# Patient Record
Sex: Male | Born: 1950
Health system: Southern US, Community
[De-identification: ages and names within clinical notes are randomized; demographics above are authoritative.]

## PROBLEM LIST (undated history)

## (undated) DIAGNOSIS — Z86010 Personal history of colonic polyps: Secondary | ICD-10-CM

## (undated) DIAGNOSIS — E0590 Thyrotoxicosis, unspecified without thyrotoxic crisis or storm: Secondary | ICD-10-CM

## (undated) DIAGNOSIS — E079 Disorder of thyroid, unspecified: Secondary | ICD-10-CM

## (undated) DIAGNOSIS — M1990 Unspecified osteoarthritis, unspecified site: Secondary | ICD-10-CM

## (undated) DIAGNOSIS — E039 Hypothyroidism, unspecified: Secondary | ICD-10-CM

## (undated) HISTORY — PX: TOE SURGERY: SHX1073

## (undated) HISTORY — PX: AMPUTATION OF REPLICATED TOES: SHX1136

## (undated) HISTORY — PX: OTHER SURGICAL HISTORY: SHX169

## (undated) HISTORY — DX: Personal history of colonic polyps: Z86.010

## (undated) HISTORY — PX: COLONOSCOPY: SHX174

---

## 1975-07-08 HISTORY — PX: APPENDECTOMY: SHX54

## 2003-07-03 ENCOUNTER — Ambulatory Visit (HOSPITAL_COMMUNITY): Admission: RE | Admit: 2003-07-03 | Discharge: 2003-07-03 | Payer: Self-pay | Admitting: Internal Medicine

## 2006-04-24 ENCOUNTER — Ambulatory Visit (HOSPITAL_COMMUNITY): Admission: RE | Admit: 2006-04-24 | Discharge: 2006-04-24 | Payer: Self-pay | Admitting: Family Medicine

## 2006-06-08 ENCOUNTER — Ambulatory Visit (HOSPITAL_COMMUNITY): Admission: RE | Admit: 2006-06-08 | Discharge: 2006-06-08 | Payer: Self-pay | Admitting: General Surgery

## 2008-04-26 ENCOUNTER — Ambulatory Visit (HOSPITAL_COMMUNITY): Admission: RE | Admit: 2008-04-26 | Discharge: 2008-04-26 | Payer: Self-pay | Admitting: Family Medicine

## 2008-05-05 ENCOUNTER — Ambulatory Visit (HOSPITAL_COMMUNITY): Admission: RE | Admit: 2008-05-05 | Discharge: 2008-05-05 | Payer: Self-pay | Admitting: Family Medicine

## 2009-03-07 IMAGING — CT CT PELVIS WO/W CM
2 of 4 series · 14 of 32 positions shown, 19 images · IV contrast (Omnipaque 300)
Comparison: None

CT ABDOMEN

CLINICAL DATA: Microscopic hematuria, abdominal bruit

CT ABDOMEN AND PELVIS WITHOUT AND WITH CONTRAST
TECHNIQUE: Multidetector CT imaging of the abdomen and pelvis was
performed following the standard protocol before and following the
bolus administration of intravenous contrast. Sagittal and coronal
MPR images reconstructed from axial data set.
Contrast: 125 ml Qmnipaque-FII

[Series 3: abd_pel_wo 5.0 b40f · axial · 0.83mm/px · z∈[-480,-190]mm · 6 of 94 slices shown]
[im 12/94  soft-tissue]
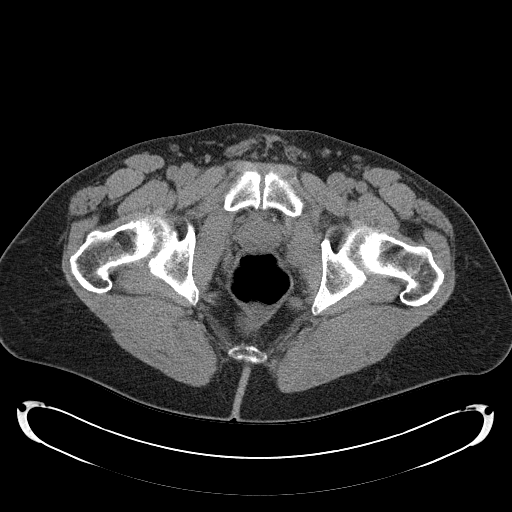
[im 24/94  soft-tissue]
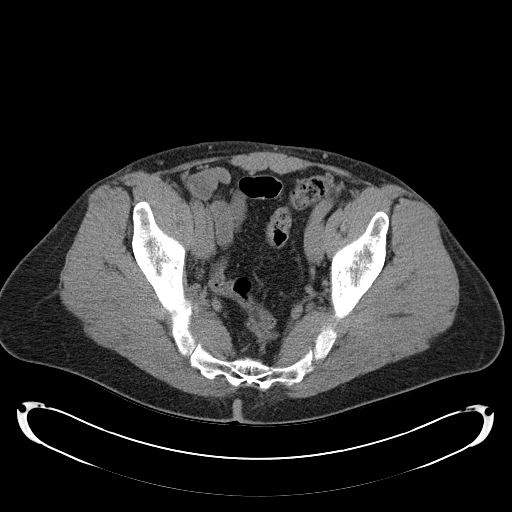
[im 35/94  soft-tissue]
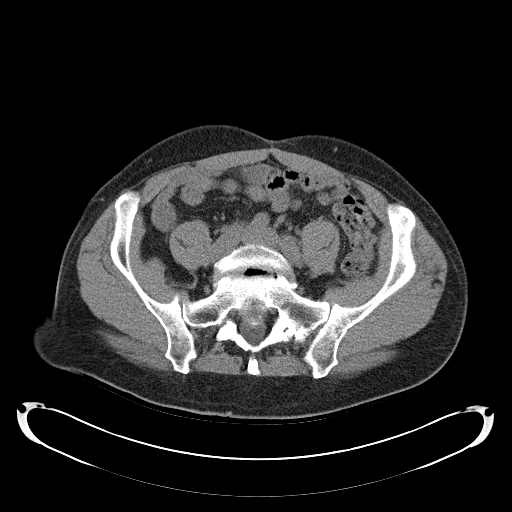
[im 47/94  soft-tissue]
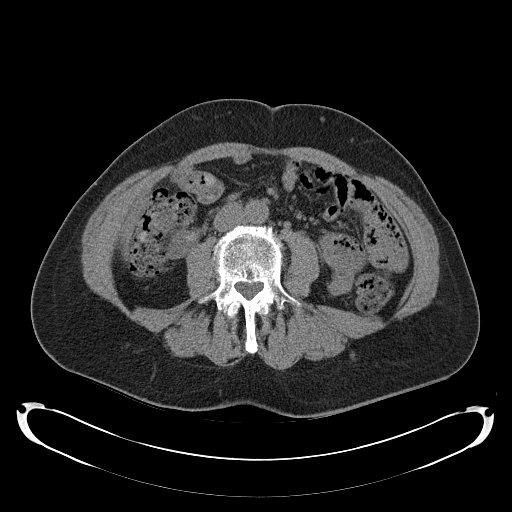
[im 59/94  soft-tissue]
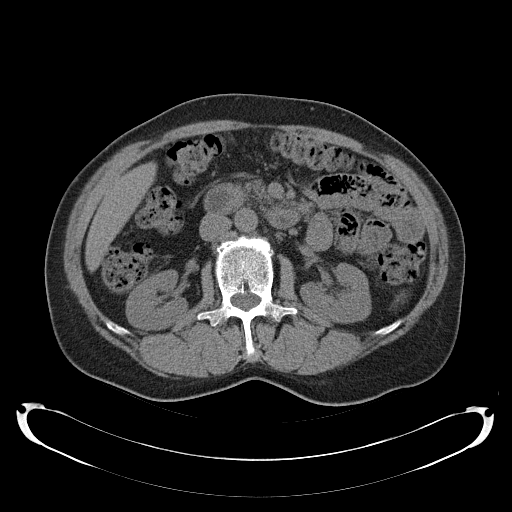
[im 70/94  soft-tissue]
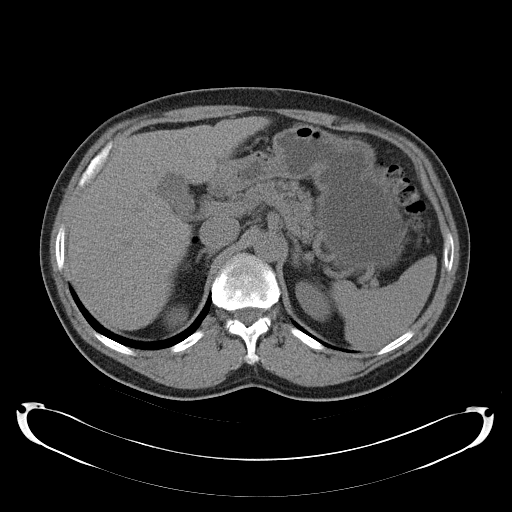

[Series 6: abd_pel_with 5.0 b40f · axial · 0.83mm/px · z∈[-484,-124]mm · 8 of 94 slices shown, 13 images]
[im 11/94  soft-tissue]
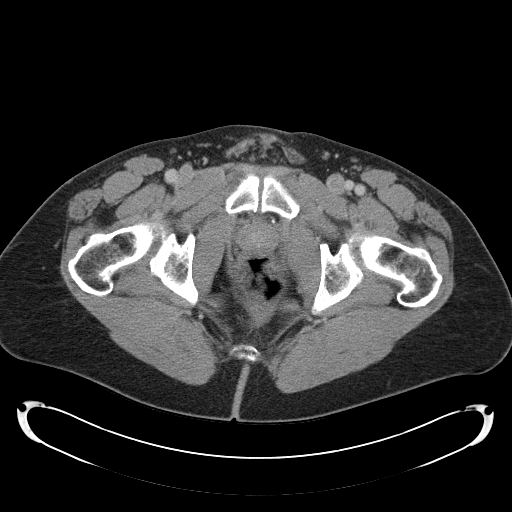
[im 11/94  bone]
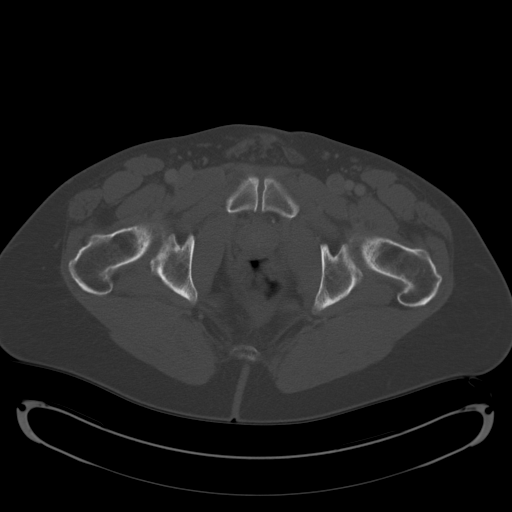
[im 21/94  soft-tissue]
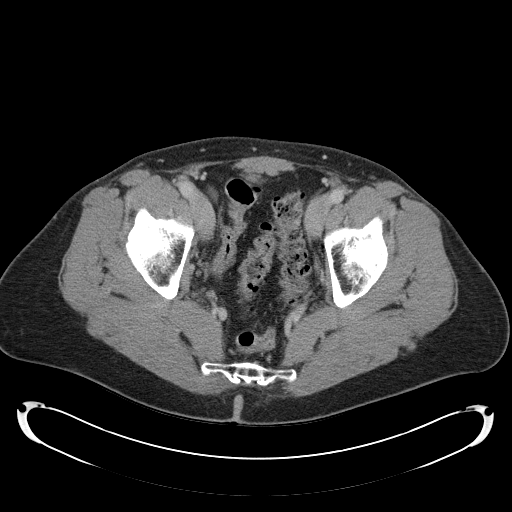
[im 32/94  soft-tissue]
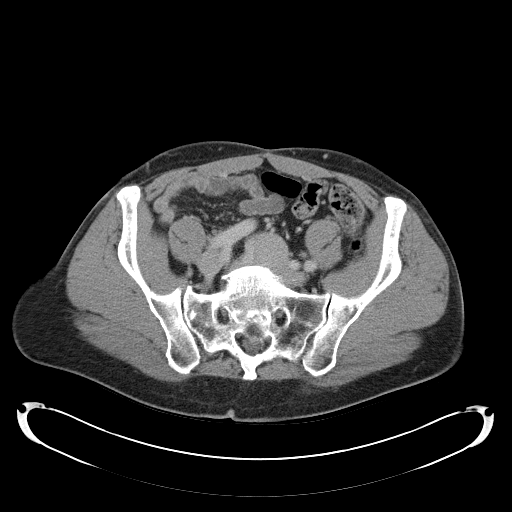
[im 42/94  soft-tissue]
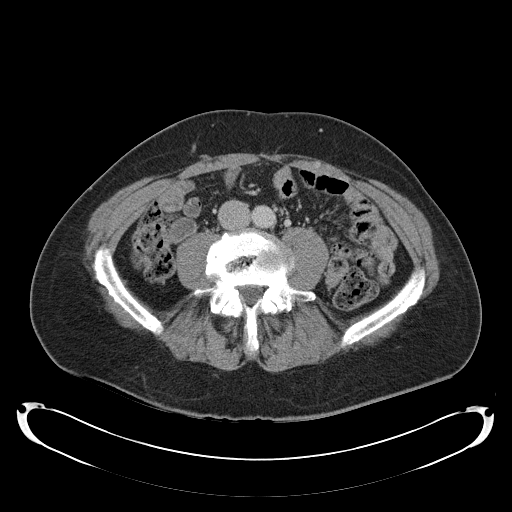
[im 52/94  soft-tissue]
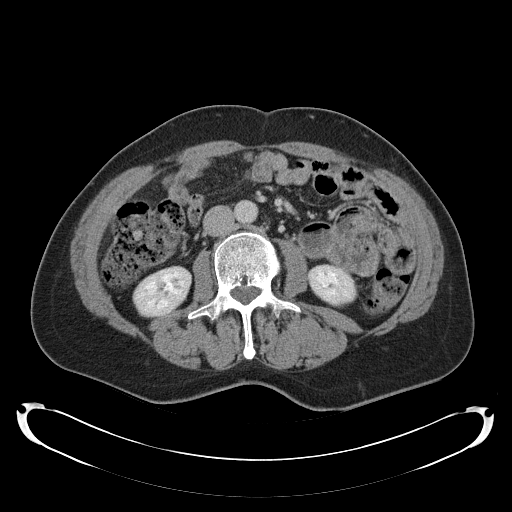
[im 52/94  lung]
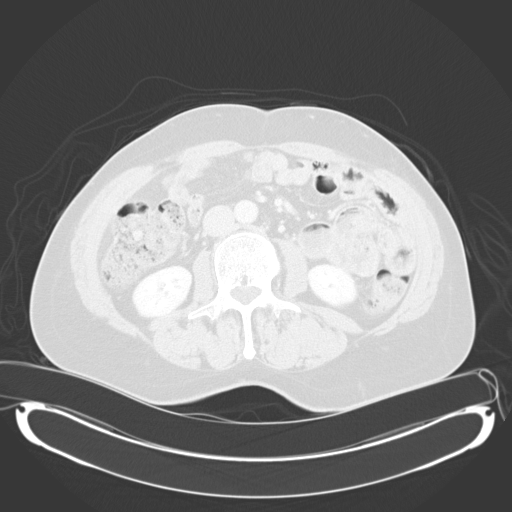
[im 63/94  soft-tissue]
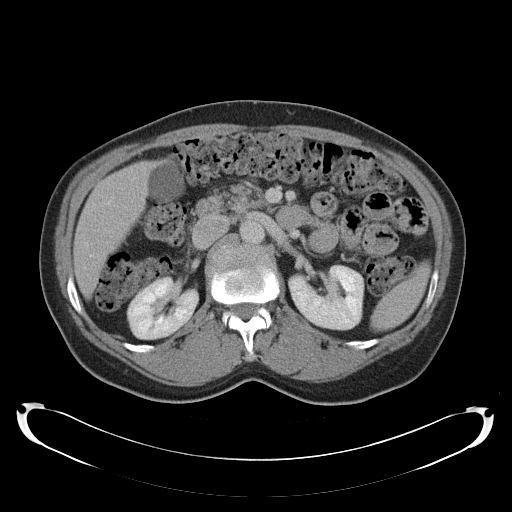
[im 63/94  lung]
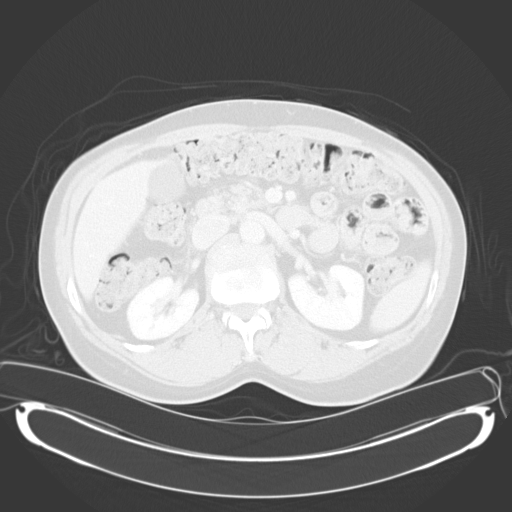
[im 73/94  soft-tissue]
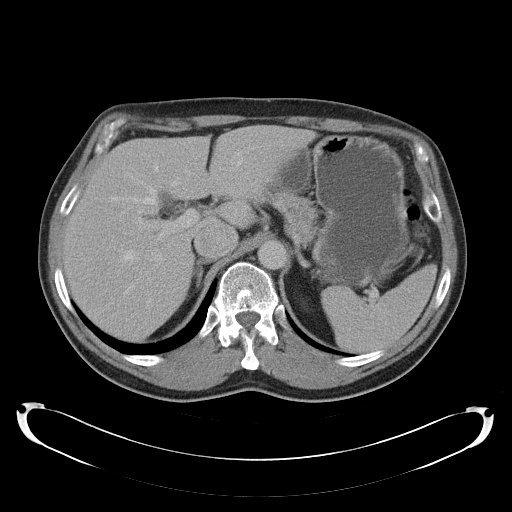
[im 73/94  lung]
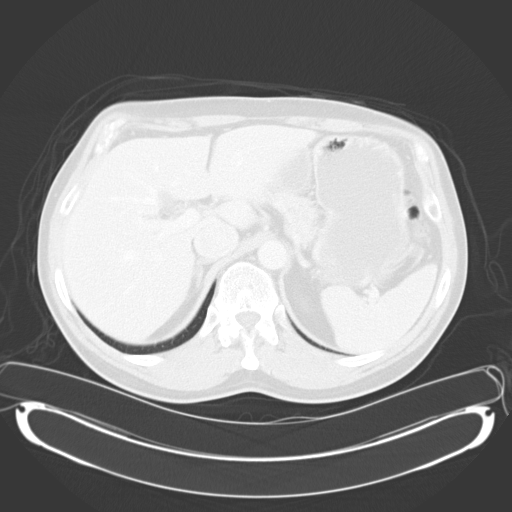
[im 83/94  soft-tissue]
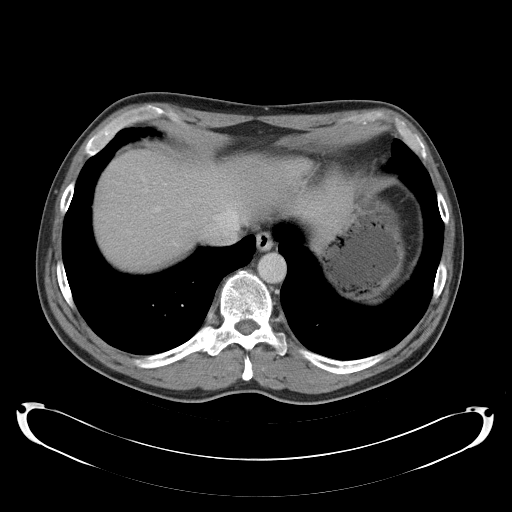
[im 83/94  lung]
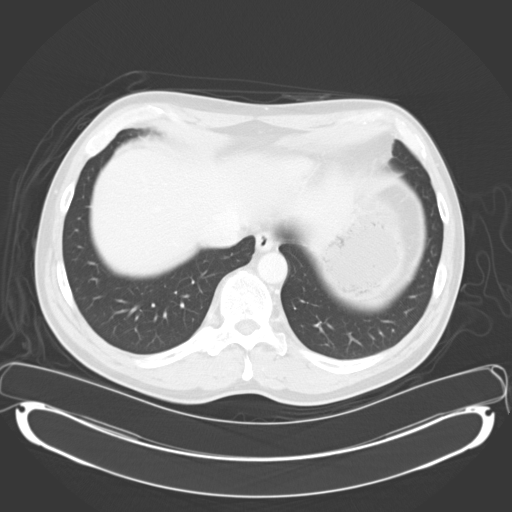

[14 of 32 positions shown; findings below may reference images not displayed]

FINDINGS: Lung bases clear.
No renal calcifications seen in either kidney on precontrast exam.
Dependent calculi in gallbladder.
Tiny cyst mid left kidney, 5 mm image 30. Normal renal enhancement
without mass, hydronephrosis, or nephrographic defect.
No collecting system abnormalities.
Single nonobstructed ureters.

Liver, spleen, pancreas, and adrenal glands normal.
Stomach and upper abdominal bowel loops unremarkable.
No mass, adenopathy, or free fluid.
No evidence of abdominal aortic aneurysm or atherosclerotic
calcification.
IMPRESSION: Tiny 5 mm diameter left renal cyst.
Cholelithiasis.
Otherwise normal CT abdomen.

CT PELVIS
FINDINGS: Distal ureters normal caliber without filling defects or
dilatation.
Small bladder volume demonstrated, no gross bladder abnormalities
seen.
Small left pelvic phleboliths.
Large and small bowel loops in pelvis normal.
No pelvic mass, adenopathy, or free fluid.
Degenerative disc and facet disease changes lumbar spine.
IMPRESSION: No acute intrapelvic abnormalities.

## 2010-11-22 NOTE — H&P (Signed)
NAMEJEREMIA, GROOT NO.:  [id number]   MEDICAL RECORD NO.:  [id number]          PATIENT TYPE:  AMB   LOCATION:                                FACILITY:  [facility]   PHYSICIAN:  Dalia Heading, M.D.  DATE OF BIRTH:  03-05-1951   DATE OF ADMISSION:  DATE OF DISCHARGE:  LH                                HISTORY & PHYSICAL   CHIEF COMPLAINT:  Need for screening colonoscopy.   HISTORY OF PRESENT ILLNESS:  The patient is a 60 year old white male who is  referred for endoscopic evaluation.  He needs a colonoscopy for screening  purposes.  No abdominal pain, weight loss, nausea, vomiting, diarrhea,  constipation, melena, or hematochezia.  He has never had a colonoscopy.  There is no family history of colon carcinoma.   PAST MEDICAL HISTORY:  Unremarkable.   PAST SURGICAL HISTORY:  Foot surgery.   CURRENT MEDICATIONS:  None.   ALLERGIES:  No known drug allergies.   REVIEW OF SYSTEMS:  Noncontributory.   PHYSICAL EXAMINATION:  GENERAL:  The patient is a well-developed, well-  nourished white male in no acute distress.  LUNGS:  Clear to auscultation with equal breath sounds bilaterally.  HEART EXAMINATION:  Reveals A regular rate and rhythm without S3-S4, or  murmurs.  ABDOMEN:  Soft, nontender, nondistended.  No hepatosplenomegaly or masses  noted.  RECTAL:  Examination was deferred to the procedure.   IMPRESSION:  Need for screening colonoscopy.   PLAN:  The patient is scheduled for a colonoscopy on 06/08/2006.  The risks  and benefits of the procedure including bleeding and perforation were fully  explained to the patient who gave informed consent.      Dalia Heading, M.D.  Electronically Signed     MAJ/MEDQ  D:  05/07/2006  T:  05/07/2006  Job:  [id number]   cc:   Patrica Duel, M.D.  Fax: [phone]

## 2014-04-24 ENCOUNTER — Other Ambulatory Visit (HOSPITAL_COMMUNITY): Payer: Self-pay | Admitting: Internal Medicine

## 2014-04-24 DIAGNOSIS — R945 Abnormal results of liver function studies: Secondary | ICD-10-CM

## 2014-04-24 DIAGNOSIS — E0590 Thyrotoxicosis, unspecified without thyrotoxic crisis or storm: Secondary | ICD-10-CM

## 2014-04-25 ENCOUNTER — Encounter (HOSPITAL_COMMUNITY): Admission: RE | Admit: 2014-04-25 | Payer: Managed Care, Other (non HMO) | Source: Ambulatory Visit

## 2014-04-26 ENCOUNTER — Other Ambulatory Visit (HOSPITAL_COMMUNITY): Payer: Self-pay

## 2014-04-26 ENCOUNTER — Encounter (HOSPITAL_COMMUNITY): Payer: Managed Care, Other (non HMO)

## 2014-05-02 ENCOUNTER — Encounter (HOSPITAL_COMMUNITY)
Admission: RE | Admit: 2014-05-02 | Discharge: 2014-05-02 | Disposition: A | Discharge status: Home / Self Care | Payer: Managed Care, Other (non HMO) | Source: Ambulatory Visit | Attending: Internal Medicine | Admitting: Internal Medicine

## 2014-05-02 ENCOUNTER — Encounter (HOSPITAL_COMMUNITY): Payer: Self-pay

## 2014-05-02 DIAGNOSIS — E0590 Thyrotoxicosis, unspecified without thyrotoxic crisis or storm: Secondary | ICD-10-CM | POA: Insufficient documentation

## 2014-05-02 MED ORDER — SODIUM IODIDE I 131 CAPSULE
14.0000 | Freq: Once | INTRAVENOUS | Status: AC | PRN
  Administered 2014-05-02: 14 via ORAL

## 2014-05-03 ENCOUNTER — Encounter (HOSPITAL_COMMUNITY)
Admission: RE | Admit: 2014-05-03 | Discharge: 2014-05-03 | Disposition: A | Discharge status: Home / Self Care | Payer: Managed Care, Other (non HMO) | Source: Ambulatory Visit | Attending: Internal Medicine | Admitting: Internal Medicine

## 2014-05-03 DIAGNOSIS — E0590 Thyrotoxicosis, unspecified without thyrotoxic crisis or storm: Secondary | ICD-10-CM | POA: Diagnosis not present

## 2014-05-03 MED ORDER — SODIUM PERTECHNETATE TC 99M INJECTION
10.0000 | Freq: Once | INTRAVENOUS | Status: AC | PRN
  Administered 2014-05-03: 10 via INTRAVENOUS

## 2014-05-30 ENCOUNTER — Encounter (HOSPITAL_COMMUNITY): Payer: Self-pay | Admitting: Emergency Medicine

## 2014-05-30 ENCOUNTER — Emergency Department (HOSPITAL_COMMUNITY)
Admission: EM | Admit: 2014-05-30 | Discharge: 2014-05-30 | Disposition: A | Discharge status: Home / Self Care | Payer: Managed Care, Other (non HMO) | Source: Home / Self Care | Attending: Family Medicine | Admitting: Family Medicine

## 2014-05-30 DIAGNOSIS — J029 Acute pharyngitis, unspecified: Secondary | ICD-10-CM

## 2014-05-30 MED ORDER — IPRATROPIUM BROMIDE 0.06 % NA SOLN: 2.0000 | Freq: Four times a day (QID) | NASAL | Status: DC

## 2014-05-30 MED ORDER — AZITHROMYCIN 250 MG PO TABS: 250.0000 mg | ORAL_TABLET | Freq: Every day | ORAL | Status: DC

## 2014-05-30 MED ORDER — METHYLPREDNISOLONE ACETATE PF 80 MG/ML IJ SUSP
80.0000 mg | Freq: Once | INTRAMUSCULAR | Status: AC
  Administered 2014-05-30: 80 mg via INTRAMUSCULAR

## 2014-05-30 MED ORDER — METHYLPREDNISOLONE ACETATE 80 MG/ML IJ SUSP
INTRAMUSCULAR | Status: AC
  Filled 2014-05-30: qty 1

## 2014-05-30 NOTE — ED Provider Notes (Signed)
Jonathan Ochoa is a 63 y.o. male who presents to Urgent Care today for chills fatigue sore throat and weakness. Symptoms present for several days. Patient additionally has facial pain and pressure. He's tried over-the-counter medications. Typically in the past she has done well with a steroid shot. He would like this today if possible.   History reviewed. No pertinent past medical history. History reviewed. No pertinent past surgical history. History  Substance Use Topics  . Smoking status: Never Smoker   . Smokeless tobacco: Not on file  . Alcohol Use: No   ROS as above Medications: No current facility-administered medications for this encounter.   Current Outpatient Prescriptions  Medication Sig Dispense Refill  . azithromycin (ZITHROMAX) 250 MG tablet Take 1 tablet (250 mg total) by mouth daily. Take first 2 tablets together, then 1 every day until finished. 6 tablet 0  . ipratropium (ATROVENT) 0.06 % nasal spray Place 2 sprays into both nostrils 4 (four) times daily. 15 mL 1   No Known Allergies   Exam:  BP 136/65 mmHg  Pulse 81  Temp(Src) 99.3 F (37.4 C) (Oral)  Resp 16  SpO2 100% Gen: Well NAD HEENT: EOMI,  MMM posterior pharynx with cobblestoning. Normal tympanic membranes. Clear nasal discharge. Mildly tender bilateral maxillary sinuses present. Lungs: Normal work of breathing. CTABL Heart: RRR no MRG Abd: NABS, Soft. Nondistended, Nontender Exts: Brisk capillary refill, warm and well perfused.   80 mg IM Depo-Medrol injection provided.  No results found for this or any previous visit (from the past 24 hour(s)). No results found.  Assessment and Plan: 63 y.o. male with pharyngitis and sinusitis likely viral. Treatment with steroid shot as above Atrovent nasal spray. He is azithromycin if not improving.  Discussed warning signs or symptoms. Please see discharge instructions. Patient expresses understanding.     Gregor Hams, MD 05/30/14 [phone]

## 2014-05-30 NOTE — Discharge Instructions (Signed)
Thank you for coming in today. Continue Tylenol and ibuprofen as needed. Use Atrovent nasal spray. Take azithromycin antibiotics if not getting better Call or go to the emergency room if you get worse, have trouble breathing, have chest pains, or palpitations.    Pharyngitis Pharyngitis is redness, pain, and swelling (inflammation) of your pharynx.  CAUSES  Pharyngitis is usually caused by infection. Most of the time, these infections are from viruses (viral) and are part of a cold. However, sometimes pharyngitis is caused by bacteria (bacterial). Pharyngitis can also be caused by allergies. Viral pharyngitis may be spread from person to person by coughing, sneezing, and personal items or utensils (cups, forks, spoons, toothbrushes). Bacterial pharyngitis may be spread from person to person by more intimate contact, such as kissing.  SIGNS AND SYMPTOMS  Symptoms of pharyngitis include:   Sore throat.   Tiredness (fatigue).   Low-grade fever.   Headache.  Joint pain and muscle aches.  Skin rashes.  Swollen lymph nodes.  Plaque-like film on throat or tonsils (often seen with bacterial pharyngitis). DIAGNOSIS  Your health care provider will ask you questions about your illness and your symptoms. Your medical history, along with a physical exam, is often all that is needed to diagnose pharyngitis. Sometimes, a rapid strep test is done. Other lab tests may also be done, depending on the suspected cause.  TREATMENT  Viral pharyngitis will usually get better in 3-4 days without the use of medicine. Bacterial pharyngitis is treated with medicines that kill germs (antibiotics).  HOME CARE INSTRUCTIONS   Drink enough water and fluids to keep your urine clear or pale yellow.   Only take over-the-counter or prescription medicines as directed by your health care provider:   If you are prescribed antibiotics, make sure you finish them even if you start to feel better.   Do not take  aspirin.   Get lots of rest.   Gargle with 8 oz of salt water ( tsp of salt per 1 qt of water) as often as every 1-2 hours to soothe your throat.   Throat lozenges (if you are not at risk for choking) or sprays may be used to soothe your throat. SEEK MEDICAL CARE IF:   You have large, tender lumps in your neck.  You have a rash.  You cough up green, yellow-brown, or bloody spit. SEEK IMMEDIATE MEDICAL CARE IF:   Your neck becomes stiff.  You drool or are unable to swallow liquids.  You vomit or are unable to keep medicines or liquids down.  You have severe pain that does not go away with the use of recommended medicines.  You have trouble breathing (not caused by a stuffy nose). MAKE SURE YOU:   Understand these instructions.  Will watch your condition.  Will get help right away if you are not doing well or get worse. Document Released: 06/23/2005 Document Revised: 04/13/2013 Document Reviewed: 02/28/2013 [facility] Patient Information 2015 [place], [place]. This information is not intended to replace advice given to you by your health care provider. Make sure you discuss any questions you have with your health care provider.

## 2014-05-30 NOTE — ED Notes (Signed)
Fever, headache, sorethroat, and weakness onset sunday

## 2014-07-27 ENCOUNTER — Other Ambulatory Visit (HOSPITAL_COMMUNITY): Payer: Self-pay | Admitting: Endocrinology

## 2014-07-27 DIAGNOSIS — E0590 Thyrotoxicosis, unspecified without thyrotoxic crisis or storm: Secondary | ICD-10-CM

## 2014-08-04 ENCOUNTER — Encounter (HOSPITAL_COMMUNITY)
Admission: RE | Admit: 2014-08-04 | Discharge: 2014-08-04 | Disposition: A | Discharge status: Home / Self Care | Payer: Managed Care, Other (non HMO) | Source: Ambulatory Visit | Attending: Endocrinology | Admitting: Endocrinology

## 2014-08-04 ENCOUNTER — Encounter (HOSPITAL_COMMUNITY): Payer: Self-pay

## 2014-08-04 DIAGNOSIS — E0590 Thyrotoxicosis, unspecified without thyrotoxic crisis or storm: Secondary | ICD-10-CM | POA: Insufficient documentation

## 2014-08-04 MED ORDER — SODIUM IODIDE I 131 CAPSULE
20.0000 | Freq: Once | INTRAVENOUS | Status: AC | PRN
  Administered 2014-08-04: 21 via ORAL

## 2015-06-06 IMAGING — NM NM RAI THERAPY FOR HYPERTHYROIDISM
1 series · 1 of 1 positions shown · non-contrast
Comparison: none

CLINICAL DATA: Hyperthyroidism

EXAM:
RADIOACTIVE IODINE THERAPY FOR HYPERTHYROIDISM
TECHNIQUE: Procedure, benefits and risks of radioactive iodine therapy for
hyperthyroidism were discussed with the patient, as well as
alternatives.

[Series 1: bone statics · 2.07mm/px · 1 of 1 slices shown]
[im 1/1]
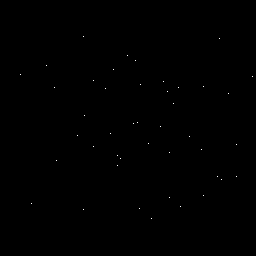

[1 of 1 positions shown; findings below may reference images not displayed]

Radiation safety issues and procedures were discussed, including
minimization of radiation risk to family and general public.

Written safety instructions were provided to the patient.

Patient's questions were answered.

Written informed consent for the therapy was obtained.

Timeout protocol followed.

Patient then received the radiopharmaceutical without immediate
complication.

RADIOPHARMACEUTICALS:  21 mCi G-YBY sodium iodide orally
IMPRESSION: Per oral administration of 21 mCi of G-YBY sodium iodide for the
treatment of hyperthyroidism.

## 2015-10-05 DIAGNOSIS — M25562 Pain in left knee: Secondary | ICD-10-CM | POA: Diagnosis not present

## 2016-01-15 DIAGNOSIS — M25562 Pain in left knee: Secondary | ICD-10-CM | POA: Diagnosis not present

## 2016-02-04 DIAGNOSIS — S83282A Other tear of lateral meniscus, current injury, left knee, initial encounter: Secondary | ICD-10-CM | POA: Diagnosis not present

## 2016-02-04 DIAGNOSIS — S83242A Other tear of medial meniscus, current injury, left knee, initial encounter: Secondary | ICD-10-CM | POA: Diagnosis not present

## 2016-02-04 DIAGNOSIS — M23262 Derangement of other lateral meniscus due to old tear or injury, left knee: Secondary | ICD-10-CM | POA: Diagnosis not present

## 2016-02-04 DIAGNOSIS — M94262 Chondromalacia, left knee: Secondary | ICD-10-CM | POA: Diagnosis not present

## 2016-02-04 DIAGNOSIS — S83232A Complex tear of medial meniscus, current injury, left knee, initial encounter: Secondary | ICD-10-CM | POA: Diagnosis not present

## 2016-03-05 DIAGNOSIS — J069 Acute upper respiratory infection, unspecified: Secondary | ICD-10-CM | POA: Diagnosis not present

## 2016-03-05 DIAGNOSIS — E063 Autoimmune thyroiditis: Secondary | ICD-10-CM | POA: Diagnosis not present

## 2016-03-05 DIAGNOSIS — J018 Other acute sinusitis: Secondary | ICD-10-CM | POA: Diagnosis not present

## 2016-03-05 DIAGNOSIS — Z23 Encounter for immunization: Secondary | ICD-10-CM | POA: Diagnosis not present

## 2016-03-05 DIAGNOSIS — Z1389 Encounter for screening for other disorder: Secondary | ICD-10-CM | POA: Diagnosis not present

## 2016-06-11 DIAGNOSIS — E890 Postprocedural hypothyroidism: Secondary | ICD-10-CM | POA: Diagnosis not present

## 2016-06-17 DIAGNOSIS — Z1389 Encounter for screening for other disorder: Secondary | ICD-10-CM | POA: Diagnosis not present

## 2016-06-17 DIAGNOSIS — Z0000 Encounter for general adult medical examination without abnormal findings: Secondary | ICD-10-CM | POA: Diagnosis not present

## 2016-06-17 DIAGNOSIS — E063 Autoimmune thyroiditis: Secondary | ICD-10-CM | POA: Diagnosis not present

## 2016-06-17 DIAGNOSIS — Z23 Encounter for immunization: Secondary | ICD-10-CM | POA: Diagnosis not present

## 2016-06-17 DIAGNOSIS — I10 Essential (primary) hypertension: Secondary | ICD-10-CM | POA: Diagnosis not present

## 2016-06-17 DIAGNOSIS — E890 Postprocedural hypothyroidism: Secondary | ICD-10-CM | POA: Diagnosis not present

## 2016-07-02 DIAGNOSIS — Z1211 Encounter for screening for malignant neoplasm of colon: Secondary | ICD-10-CM | POA: Diagnosis not present

## 2016-07-03 DIAGNOSIS — L570 Actinic keratosis: Secondary | ICD-10-CM | POA: Diagnosis not present

## 2016-07-03 DIAGNOSIS — X32XXXD Exposure to sunlight, subsequent encounter: Secondary | ICD-10-CM | POA: Diagnosis not present

## 2016-07-03 DIAGNOSIS — D225 Melanocytic nevi of trunk: Secondary | ICD-10-CM | POA: Diagnosis not present

## 2016-12-12 DIAGNOSIS — M25562 Pain in left knee: Secondary | ICD-10-CM | POA: Diagnosis not present

## 2017-03-15 DIAGNOSIS — M79661 Pain in right lower leg: Secondary | ICD-10-CM | POA: Diagnosis not present

## 2017-05-01 DIAGNOSIS — M545 Low back pain: Secondary | ICD-10-CM | POA: Diagnosis not present

## 2017-05-14 DIAGNOSIS — M4316 Spondylolisthesis, lumbar region: Secondary | ICD-10-CM | POA: Diagnosis not present

## 2017-05-14 DIAGNOSIS — M545 Low back pain: Secondary | ICD-10-CM | POA: Diagnosis not present

## 2017-05-14 DIAGNOSIS — M5416 Radiculopathy, lumbar region: Secondary | ICD-10-CM | POA: Diagnosis not present

## 2017-06-04 DIAGNOSIS — M1712 Unilateral primary osteoarthritis, left knee: Secondary | ICD-10-CM | POA: Diagnosis not present

## 2017-06-04 DIAGNOSIS — M1711 Unilateral primary osteoarthritis, right knee: Secondary | ICD-10-CM | POA: Diagnosis not present

## 2017-06-04 DIAGNOSIS — M170 Bilateral primary osteoarthritis of knee: Secondary | ICD-10-CM | POA: Diagnosis not present

## 2017-06-06 ENCOUNTER — Encounter (HOSPITAL_COMMUNITY): Payer: Self-pay | Admitting: Family Medicine

## 2017-06-06 ENCOUNTER — Ambulatory Visit (HOSPITAL_COMMUNITY)
Admission: EM | Admit: 2017-06-06 | Discharge: 2017-06-06 | Disposition: A | Discharge status: Home / Self Care | Payer: Managed Care, Other (non HMO) | Attending: Internal Medicine | Admitting: Internal Medicine

## 2017-06-06 DIAGNOSIS — J069 Acute upper respiratory infection, unspecified: Secondary | ICD-10-CM

## 2017-06-06 HISTORY — DX: Disorder of thyroid, unspecified: E07.9

## 2017-06-06 MED ORDER — FLUTICASONE PROPIONATE 50 MCG/ACT NA SUSP: 1.0000 | Freq: Every day | NASAL | 2 refills | Status: DC

## 2017-06-06 MED ORDER — LEVOFLOXACIN 750 MG PO TABS: 750.0000 mg | ORAL_TABLET | Freq: Every day | ORAL | 0 refills | Status: AC

## 2017-06-06 NOTE — Discharge Instructions (Signed)
Continue to push fluids and take over the counter medications as directed on the back of the box for symptomatic relief.  ° °

## 2017-06-06 NOTE — ED Provider Notes (Signed)
[facility]    CSN: [id number] Arrival date & time: 06/06/17  1629     History   Chief Complaint Chief Complaint  Patient presents with  . URI    HPI Jonathan Ochoa is a 66 y.o. male.   66 y.o. male presents with headache, congestion, sinus pressure and subjective fevers X Thursday. Condition is persistent in nature Condition is acute in nature. Condition is made better by nothing. Condition is made worse by nothing. Patient denies any relief from OTC sinus medication. prior to there arrival at this facility. Patient deneis any cough, nausea, vomitting or diarrhea. Patient states that he gets sinus infections around the same time every year. Patient requesting speciefic antibiotic.        Past Medical History:  Diagnosis Date  . Thyroid disease     There are no active problems to display for this patient.   History reviewed. No pertinent surgical history.     Home Medications    Prior to Admission medications   Medication Sig Start Date End Date Taking? Authorizing Provider  ipratropium (ATROVENT) 0.06 % nasal spray Place 2 sprays into both nostrils 4 (four) times daily. 05/30/14   Gregor Hams, MD    Family History History reviewed. No pertinent family history.  Social History Social History   Tobacco Use  . Smoking status: Never Smoker  Substance Use Topics  . Alcohol use: No  . Drug use: No     Allergies   Patient has no known allergies.   Review of Systems Review of Systems  Constitutional: Positive for fever. Negative for chills.  HENT: Positive for congestion, sinus pressure, sinus pain and sore throat. Negative for ear pain.   Eyes: Negative for pain and visual disturbance.  Respiratory: Negative for cough and shortness of breath.   Cardiovascular: Negative for chest pain and palpitations.  Gastrointestinal: Negative for abdominal pain and vomiting.  Genitourinary: Negative for dysuria and hematuria.  Musculoskeletal:  Negative for arthralgias and back pain.  Skin: Negative for color change and rash.  Neurological: Negative for seizures and syncope.  All other systems reviewed and are negative.    Physical Exam Triage Vital Signs ED Triage Vitals [06/06/17 1717]  Enc Vitals Group     BP (!) 144/71     Pulse Rate 63     Resp 18     Temp 98.4 F (36.9 C)     Temp Source Oral     SpO2 98 %     Weight      Height      Head Circumference      Peak Flow      Pain Score      Pain Loc      Pain Edu?      Excl. in [place]?    No data found.  Updated Vital Signs BP (!) 144/71   Pulse 63   Temp 98.4 F (36.9 C) (Oral)   Resp 18   SpO2 98%   Visual Acuity Right Eye Distance:   Left Eye Distance:   Bilateral Distance:    Right Eye Near:   Left Eye Near:    Bilateral Near:     Physical Exam  Constitutional: He is oriented to person, place, and time. He appears well-developed and well-nourished.  HENT:  Head: Normocephalic.  Neck: Normal range of motion.  Pulmonary/Chest: Effort normal.  Musculoskeletal: Normal range of motion.  Neurological: He is alert and oriented to person, place, and  time.  Skin: Skin is dry.  Psychiatric: He has a normal mood and affect.  Nursing note and vitals reviewed.    UC Treatments / Results  Labs (all labs ordered are listed, but only abnormal results are displayed) Labs Reviewed - No data to display  EKG  EKG Interpretation None       Radiology No results found.  Procedures Procedures (including critical care time)  Medications Ordered in UC Medications - No data to display   Initial Impression / Assessment and Plan / UC Course  I have reviewed the triage vital signs and the nursing notes.  Pertinent labs & imaging results that were available during my care of the patient were reviewed by me and considered in my medical decision making (see chart for details).       Final Clinical Impressions(s) / UC Diagnoses   Final  diagnoses:  None    ED Discharge Orders    None       Controlled Substance Prescriptions [place] Controlled Substance Registry consulted? Not Applicable   Jacqualine Mau, NP 06/06/17 1734

## 2017-06-06 NOTE — ED Triage Notes (Signed)
Pt here for URI symptoms since Thursday. Mainly congestion with mucous and drainage.Marland Kitchen

## 2017-06-12 DIAGNOSIS — M545 Low back pain: Secondary | ICD-10-CM | POA: Diagnosis not present

## 2017-06-12 DIAGNOSIS — M5416 Radiculopathy, lumbar region: Secondary | ICD-10-CM | POA: Diagnosis not present

## 2017-06-12 DIAGNOSIS — M4316 Spondylolisthesis, lumbar region: Secondary | ICD-10-CM | POA: Diagnosis not present

## 2017-06-18 DIAGNOSIS — I10 Essential (primary) hypertension: Secondary | ICD-10-CM | POA: Diagnosis not present

## 2017-06-18 DIAGNOSIS — Z6831 Body mass index (BMI) 31.0-31.9, adult: Secondary | ICD-10-CM | POA: Diagnosis not present

## 2017-06-18 DIAGNOSIS — E039 Hypothyroidism, unspecified: Secondary | ICD-10-CM | POA: Diagnosis not present

## 2017-06-18 DIAGNOSIS — R5383 Other fatigue: Secondary | ICD-10-CM | POA: Diagnosis not present

## 2017-06-18 DIAGNOSIS — J329 Chronic sinusitis, unspecified: Secondary | ICD-10-CM | POA: Diagnosis not present

## 2017-06-18 DIAGNOSIS — E6609 Other obesity due to excess calories: Secondary | ICD-10-CM | POA: Diagnosis not present

## 2017-06-18 DIAGNOSIS — R311 Benign essential microscopic hematuria: Secondary | ICD-10-CM | POA: Diagnosis not present

## 2017-06-18 DIAGNOSIS — Z0001 Encounter for general adult medical examination with abnormal findings: Secondary | ICD-10-CM | POA: Diagnosis not present

## 2017-06-20 DIAGNOSIS — E748 Other specified disorders of carbohydrate metabolism: Secondary | ICD-10-CM | POA: Diagnosis not present

## 2017-09-01 DIAGNOSIS — E0590 Thyrotoxicosis, unspecified without thyrotoxic crisis or storm: Secondary | ICD-10-CM | POA: Diagnosis not present

## 2017-09-03 DIAGNOSIS — M1711 Unilateral primary osteoarthritis, right knee: Secondary | ICD-10-CM | POA: Diagnosis not present

## 2017-09-03 DIAGNOSIS — M1712 Unilateral primary osteoarthritis, left knee: Secondary | ICD-10-CM | POA: Diagnosis not present

## 2017-09-08 DIAGNOSIS — E890 Postprocedural hypothyroidism: Secondary | ICD-10-CM | POA: Diagnosis not present

## 2017-10-08 ENCOUNTER — Ambulatory Visit: Payer: Self-pay | Admitting: Orthopedic Surgery

## 2017-10-23 ENCOUNTER — Ambulatory Visit: Payer: Self-pay | Admitting: Orthopedic Surgery

## 2017-10-23 NOTE — H&P (Signed)
Jonathan Ochoa, Jonathan Ochoa [phone], M) DOB [birth year]   Chief Complaint Left Knee Pain H&P left TKA 11/16/2017 at [facility]  Patient's Pharmacies [facility] [address] [facility]): [address] [place], [place] [address], Ph [phone], Fax [phone]  Vitals Ht: 5 ft 11 in  BP: 130/82 Pulse: 60   Allergies Reviewed Allergies NKDA   Medications Reviewed Medications levothyroxine 137 mcg tablet 09/24/17   filled [employer]   Problems Reviewed Problems Osteoarthritis of knee  Osteoarthritis of left knee joint    Family History Reviewed Family History Father - Parents deceased   - Malignant tumor of lung Mother - Parents deceased   - Cerebrovascular accident   Social History Reviewed Social History Smoking Status: Never smoker Chewing tobacco: none Alcohol intake: None Hand Dominance: Right Work related injury?: N Advance directive: N Medical Power of Attorney: N   Surgical History Reviewed Surgical History Knee Arthroscopy - Left Knee - 2017 Amputation of toe - Left Big Toe, 2nd Toe - 1982 Appendectomy - 1976   Past Medical History Reviewed Past Medical History Thyroid Problems/Goiter: Y - Hypothyroidism   HPI The patient is here today for a pre-operative History and Physical. They are scheduled for left total knee replacement on 11/16/2017 with Dr. Wynelle Link at [facility]. The patient has been followed for bilateral knee pain. Patient presented months out from bilateral knee cortisone injections. He states injections offered only some temporary relief. [name] is a pleasant 67 year old male who has been seen in the clinic for follow-up of bilateral knee osteoarthritis. He has occasional aching and weakness in the knees. He received cortisone injections, which were helpful for only about 6 weeks. He has contractures of both knees. Standard two view radiographs of his bilateral knees demonstrated bone-on-bone arthritis medial and lateral  and patellofemoral. Patient has significant pain and dysfunction in their knee. They have had injections and failed nonoperative management. At this point the pain and dysfunction have essentially taken over their life. The most predictable means of improving pain and function is total knee arthroplasty.  We discussed the procedure risks, potential complications and rehab course in detail and the patient would like to go ahead and proceed with total knee arthroplasty.    ROS Constitutional: Constitutional: no fever, chills, night sweats, or significant weight loss.  Cardiovascular: Cardiovascular: no palpitations or chest pain.  Respiratory: Respiratory: no cough or shortness of breath and No COPD.  Gastrointestinal: Gastrointestinal: no vomiting or nausea.  Musculoskeletal: Musculoskeletal: no swelling in Joints and Joint Pain.  Neurologic: Neurologic: no numbness, tingling, or difficulty with balance.  CONSTITUTIONAL: no Fever, no Chills, no Night Sweats, no Weight Loss  CARDIOVASCULAR: no Cough, no Shortness of Breath, no COPD, no Asthma  GASTROINTESTINAL: no Vomiting, no Nausea  MUSCULOSKELETAL: JOINT PAIN, but no Swelling in Joints  NEUROLOGIC: no Numbness, no Tingling/Paresthesias, no Difficulty with Balance    Physical Exam Patient is a 67 year old male.  General Mental Status - Alert, cooperative and good historian. General Appearance - pleasant, Not in acute distress. Orientation - Oriented X3. Build & Nutrition - Well nourished and Well developed.  Head and Neck Head - normocephalic, atraumatic . Neck Global Assessment - supple, no bruit auscultated on the right, no bruit auscultated on the left.  Eye Wears glasses Pupil - Bilateral - PERR Motion - Bilateral - EOMI.  Chest and Lung Exam Auscultation Breath sounds - clear at anterior chest wall and clear at posterior chest wall. Adventitious sounds - No Adventitious  sounds.  Cardiovascular Auscultation Rhythm - Regular rate and rhythm. Heart Sounds - S1 WNL and S2 WNL. Murmurs & Other Heart Sounds - Auscultation of the heart reveals - No Murmurs.  Abdomen Palpation/Percussion Tenderness - Abdomen is non-tender to palpation. Abdomen is soft. Auscultation Auscultation of the abdomen reveals - Bowel sounds normal.  Male Genitourinary Note: Not done, not pertinent to present illness  Musculoskeletal Left Knee Exam:  No effusion.  Range of motion is 15-120 degrees.  There is no crepitus on range of motion of the knee.  There is no medial joint line tenderness.  There is no instability noted He has a severely antalgic gait pattern  Right Knee Exam:  No effusion.  Range of motion is 5-135 degrees.  There is no crepitus on range of motion of the knee.  There is no medial or lateral joint line tenderness.  There is no instability noted  Radiographs- Standard two views; of bilateral knees demonstrate bone-on-bone arthritis medial and lateral and patellofemoral Impression: End-stage osteoarthritis.   Assessment / Plan 1. Osteoarthritis of knee M17.12: Unilateral primary osteoarthritis, left knee  Goals Patient Instructions Surgical Plans: Left Total Knee Replacement  Disposition: Home, Straight to outpatient - [facility] PCP: Dr. Gerarda Fraction - seen and cleared to proceed with surgery IV TXA Anesthesia Issues: None Patient was instructed on what medications to stop prior to surgery. - Follow up visit in 2 weeks with Dr. Wynelle Link - Begin physical therapy following surgery - Provider to call patient to setup first PT EVAL on Friday 11/20/17 - Dr. Wynelle Link - Left Total Knee Replacement - Pre-operative lab work as pre Pre-Surgical Testing - Prescriptions will be provided in hospital at time of discharge   Anticipated LOS equal to or greater than 2 midnights due to - Age 32 and older with one or more of the following:  - Obesity  -  Expected need for hospital services (PT, OT, Nursing) required for safe  discharge  - Anticipated need for postoperative skilled nursing care or inpatient rehab  - Active co-morbidities: None   Return to Office Jonathan Arabian, MD for [facility] on 12/01/2017 at 01:30 PM  Encounter signed-off by Mickel Crow, PA-C

## 2017-10-23 NOTE — H&P (Signed)
PETRA, SARGEANT [phone], M) DOB    05-07-1951        Chief Complaint Left Knee Pain H&P left TKA 11/16/2017 at [facility]  Patient's Pharmacies [facility] [facility]): [address] [name], [place] [address], Ph [phone], Fax [phone]  Vitals Ht:        5 ft 11 in  BP: 130/82 Pulse: 60   Allergies Reviewed Allergies NKDA   Medications Reviewed Medications levothyroxine 137 mcg tablet 09/24/17   filled           [facility]   Problems Reviewed Problems Osteoarthritis of knee  Osteoarthritis of left knee joint    Family History Reviewed Family History Father  - Parents deceased             - Malignant tumor of lung Mother - Parents deceased             - Cerebrovascular accident   Social History Reviewed Social History Smoking Status: Never smoker Chewing tobacco: none Alcohol intake: None Hand Dominance: Right Work related injury?: N Advance directive: N Medical Power of Attorney: N   Surgical History Reviewed Surgical History Knee Arthroscopy - Left Knee - 2017 Amputation of toe - Left Big Toe, 2nd Toe - 1982 Appendectomy - 1976   Past Medical History Reviewed Past Medical History Thyroid Problems/Goiter: Y - Hypothyroidism   HPI The patient is here today for a pre-operative History and Physical. They are scheduled for left total knee replacement on 11/16/2017 with Dr. Wynelle Link at [facility]. The patient has been followed for bilateral knee pain. Patient presented months out from bilateral knee cortisone injections. He states injections offered only some temporary relief. Mr. Huot is a pleasant 67 year old male who has been seen in the clinic for follow-up of bilateral knee osteoarthritis. He has occasional aching and weakness in the knees. He received cortisone injections, which were helpful for only about 6 weeks. He has contractures of both knees. Standard two view radiographs of his  bilateral knees demonstrated bone-on-bone arthritis medial and lateral and patellofemoral. Patient has significant pain and dysfunction in their knee. They have had injections and failed nonoperative management. At this point the pain and dysfunction have essentially taken over their life. The most predictable means of improving pain and function is total knee arthroplasty.  We discussed the procedure risks, potential complications and rehab course in detail and the patient would like to go ahead and proceed with total knee arthroplasty.    ROS Constitutional: Constitutional: no fever, chills, night sweats, or significant weight loss.  Cardiovascular: Cardiovascular: no palpitations or chest pain.  Respiratory: Respiratory: no cough or shortness of breath and No COPD.  Gastrointestinal: Gastrointestinal: no vomiting or nausea.  Musculoskeletal: Musculoskeletal: no swelling in Joints and Joint Pain.  Neurologic: Neurologic: no numbness, tingling, or difficulty with balance.  CONSTITUTIONAL: no Fever, no Chills, no Night Sweats, no Weight Loss  CARDIOVASCULAR: no Cough, no Shortness of Breath, no COPD, no Asthma  GASTROINTESTINAL: no Vomiting, no Nausea  MUSCULOSKELETAL: JOINT PAIN, but no Swelling in Joints  NEUROLOGIC: no Numbness, no Tingling/Paresthesias, no Difficulty with Balance    Physical Exam Patient is a 67 year old male.  General Mental Status - Alert, cooperative and good historian. General Appearance - pleasant, Not in acute distress. Orientation - Oriented X3. Build & Nutrition - Well nourished and Well developed.  Head and Neck Head - normocephalic, atraumatic . Neck Global Assessment - supple, no bruit auscultated on the right, no bruit  auscultated on the left.  Eye Wears glasses Pupil - Bilateral - PERR Motion - Bilateral - EOMI.  Chest and Lung Exam Auscultation Breath sounds - clear at anterior chest wall and clear at posterior  chest wall. Adventitious sounds - No Adventitious sounds.  Cardiovascular Auscultation Rhythm - Regular rate and rhythm. Heart Sounds - S1 WNL and S2 WNL. Murmurs & Other Heart Sounds - Auscultation of the heart reveals - No Murmurs.  Abdomen Palpation/Percussion Tenderness - Abdomen is non-tender to palpation. Abdomen is soft. Auscultation Auscultation of the abdomen reveals - Bowel sounds normal.  Male Genitourinary Note: Not done, not pertinent to present illness  Musculoskeletal Left Knee Exam:  No effusion.  Range of motion is 15-120 degrees.  There is no crepitus on range of motion of the knee.  There is no medial joint line tenderness.  There is no instability noted He has a severely antalgic gait pattern  Right Knee Exam:  No effusion.  Range of motion is 5-135 degrees.  There is no crepitus on range of motion of the knee.  There is no medial or lateral joint line tenderness.  There is no instability noted  Radiographs- Standard two views; of bilateral knees demonstrate bone-on-bone arthritis medial and lateral and patellofemoral Impression: End-stage osteoarthritis.   Assessment / Plan 1. Osteoarthritis of knee M17.12: Unilateral primary osteoarthritis, left knee  Goals Patient Instructions Surgical Plans: Left Total Knee Replacement  Disposition: Home, Straight to outpatient - [facility] PCP: Dr. Gerarda Fraction - seen and cleared to proceed with surgery IV TXA Anesthesia Issues: None Patient was instructed on what medications to stop prior to surgery. - Follow up visit in 2 weeks with Dr. Wynelle Link - Begin physical therapy following surgery - Provider to call patient to setup first PT EVAL on Friday 11/20/17 - Dr. Wynelle Link - Left Total Knee Replacement - Pre-operative lab work as pre Pre-Surgical Testing - Prescriptions will be provided in hospital at time of discharge   Anticipated LOS equal to or greater than 2 midnights due to - Age 75 and  older with one or more of the following:             - Obesity             - Expected need for hospital services (PT, OT, Nursing) required for safe   discharge             - Anticipated need for postoperative skilled nursing care or inpatient rehab             - Active co-morbidities: None   Return to Office [name], MD for [facility] on 12/01/2017 at 01:30 PM  Encounter signed-off by Mickel Crow, PA-C

## 2017-11-03 NOTE — Patient Instructions (Addendum)
Jonathan Ochoa  11/03/2017   Your procedure is scheduled on: Monday 11/16/2017  Report to [facility] Main  Entrance              Report to admitting at   0800  AM    Call this number if you have problems the morning of surgery [phone]    Remember: Do not eat food or drink liquids :After Midnight.     Take these medicines the morning of surgery with A SIP OF WATER: Levothyroxine (Synthroid)                                You may not have any metal on your body including hair pins and              piercings  Do not wear jewelry, make-up, lotions, powders or perfumes, deodorant                          Men may shave face and neck.   Do not bring valuables to the hospital. [place].  Contacts, dentures or bridgework may not be worn into surgery.  Leave suitcase in the car. After surgery it may be brought to your room.                  Please read over the following fact sheets you were given: _____________________________________________________________________             [facility] - Preparing for Surgery Before surgery, you can play an important role.  Because skin is not sterile, your skin needs to be as free of germs as possible.  You can reduce the number of germs on your skin by washing with CHG (chlorahexidine gluconate) soap before surgery.  CHG is an antiseptic cleaner which kills germs and bonds with the skin to continue killing germs even after washing. Please DO NOT use if you have an allergy to CHG or antibacterial soaps.  If your skin becomes reddened/irritated stop using the CHG and inform your nurse when you arrive at Short Stay. Do not shave (including legs and underarms) for at least 48 hours prior to the first CHG shower.  You may shave your face/neck. Please follow these instructions carefully:  1.  Shower with CHG Soap the night before surgery and the  morning of  Surgery.  2.  If you choose to wash your hair, wash your hair first as usual with your  normal  shampoo.  3.  After you shampoo, rinse your hair and body thoroughly to remove the  shampoo.                           4.  Use CHG as you would any other liquid soap.  You can apply chg directly  to the skin and wash                       Gently with a scrungie or clean washcloth.  5.  Apply the CHG Soap to your body ONLY FROM THE NECK DOWN.   Do not use on face/ open  Wound or open sores. Avoid contact with eyes, ears mouth and genitals (private parts).                       Wash face,  Genitals (private parts) with your normal soap.             6.  Wash thoroughly, paying special attention to the area where your surgery  will be performed.  7.  Thoroughly rinse your body with warm water from the neck down.  8.  DO NOT shower/wash with your normal soap after using and rinsing off  the CHG Soap.                9.  Pat yourself dry with a clean towel.            10.  Wear clean pajamas.            11.  Place clean sheets on your bed the night of your first shower and do not  sleep with pets. Day of Surgery : Do not apply any lotions/deodorants the morning of surgery.  Please wear clean clothes to the hospital/surgery center.  FAILURE TO FOLLOW THESE INSTRUCTIONS MAY RESULT IN THE CANCELLATION OF YOUR SURGERY PATIENT SIGNATURE_________________________________  NURSE SIGNATURE__________________________________  ________________________________________________________________________   Jonathan Ochoa  An incentive spirometer is a tool that can help keep your lungs clear and active. This tool measures how well you are filling your lungs with each breath. Taking long deep breaths may help reverse or decrease the chance of developing breathing (pulmonary) problems (especially infection) following:  A long period of time when you are unable to move or be active. BEFORE  THE PROCEDURE   If the spirometer includes an indicator to show your best effort, your nurse or respiratory therapist will set it to a desired goal.  If possible, sit up straight or lean slightly forward. Try not to slouch.  Hold the incentive spirometer in an upright position. INSTRUCTIONS FOR USE  1. Sit on the edge of your bed if possible, or sit up as far as you can in bed or on a chair. 2. Hold the incentive spirometer in an upright position. 3. Breathe out normally. 4. Place the mouthpiece in your mouth and seal your lips tightly around it. 5. Breathe in slowly and as deeply as possible, raising the piston or the ball toward the top of the column. 6. Hold your breath for 3-5 seconds or for as long as possible. Allow the piston or ball to fall to the bottom of the column. 7. Remove the mouthpiece from your mouth and breathe out normally. 8. Rest for a few seconds and repeat Steps 1 through 7 at least 10 times every 1-2 hours when you are awake. Take your time and take a few normal breaths between deep breaths. 9. The spirometer may include an indicator to show your best effort. Use the indicator as a goal to work toward during each repetition. 10. After each set of 10 deep breaths, practice coughing to be sure your lungs are clear. If you have an incision (the cut made at the time of surgery), support your incision when coughing by placing a pillow or rolled up towels firmly against it. Once you are able to get out of bed, walk around indoors and cough well. You may stop using the incentive spirometer when instructed by your caregiver.  RISKS AND COMPLICATIONS  Take your time so you do not get  dizzy or light-headed.  If you are in pain, you may need to take or ask for pain medication before doing incentive spirometry. It is harder to take a deep breath if you are having pain. AFTER USE  Rest and breathe slowly and easily.  It can be helpful to keep track of a log of your progress.  Your caregiver can provide you with a simple table to help with this. If you are using the spirometer at home, follow these instructions: [place] IF:   You are having difficultly using the spirometer.  You have trouble using the spirometer as often as instructed.  Your pain medication is not giving enough relief while using the spirometer.  You develop fever of 100.5 F (38.1 C) or higher. SEEK IMMEDIATE MEDICAL CARE IF:   You cough up bloody sputum that had not been present before.  You develop fever of 102 F (38.9 C) or greater.  You develop worsening pain at or near the incision site. MAKE SURE YOU:   Understand these instructions.  Will watch your condition.  Will get help right away if you are not doing well or get worse. Document Released: 11/03/2006 Document Revised: 09/15/2011 Document Reviewed: 01/04/2007 [employer] Patient Information 2014 [employer], [place].   ________________________________________________________________________  WHAT IS A BLOOD TRANSFUSION? Blood Transfusion Information  A transfusion is the replacement of blood or some of its parts. Blood is made up of multiple cells which provide different functions.  Red blood cells carry oxygen and are used for blood loss replacement.  White blood cells fight against infection.  Platelets control bleeding.  Plasma helps clot blood.  Other blood products are available for specialized needs, such as hemophilia or other clotting disorders. BEFORE THE TRANSFUSION  Who gives blood for transfusions?   Healthy volunteers who are fully evaluated to make sure their blood is safe. This is blood bank blood. Transfusion therapy is the safest it has ever been in the practice of medicine. Before blood is taken from a donor, a complete history is taken to make sure that person has no history of diseases nor engages in risky social behavior (examples are intravenous drug use or sexual activity with multiple  partners). The donor's travel history is screened to minimize risk of transmitting infections, such as malaria. The donated blood is tested for signs of infectious diseases, such as HIV and hepatitis. The blood is then tested to be sure it is compatible with you in order to minimize the chance of a transfusion reaction. If you or a relative donates blood, this is often done in anticipation of surgery and is not appropriate for emergency situations. It takes many days to process the donated blood. RISKS AND COMPLICATIONS Although transfusion therapy is very safe and saves many lives, the main dangers of transfusion include:   Getting an infectious disease.  Developing a transfusion reaction. This is an allergic reaction to something in the blood you were given. Every precaution is taken to prevent this. The decision to have a blood transfusion has been considered carefully by your caregiver before blood is given. Blood is not given unless the benefits outweigh the risks. AFTER THE TRANSFUSION  Right after receiving a blood transfusion, you will usually feel much better and more energetic. This is especially true if your red blood cells have gotten low (anemic). The transfusion raises the level of the red blood cells which carry oxygen, and this usually causes an energy increase.  The nurse administering the transfusion will  monitor you carefully for complications. HOME CARE INSTRUCTIONS  No special instructions are needed after a transfusion. You may find your energy is better. Speak with your caregiver about any limitations on activity for underlying diseases you may have. SEEK MEDICAL CARE IF:   Your condition is not improving after your transfusion.  You develop redness or irritation at the intravenous (IV) site. SEEK IMMEDIATE MEDICAL CARE IF:  Any of the following symptoms occur over the next 12 hours:  Shaking chills.  You have a temperature by mouth above 102 F (38.9 C), not  controlled by medicine.  Chest, back, or muscle pain.  People around you feel you are not acting correctly or are confused.  Shortness of breath or difficulty breathing.  Dizziness and fainting.  You get a rash or develop hives.  You have a decrease in urine output.  Your urine turns a dark color or changes to pink, red, or brown. Any of the following symptoms occur over the next 10 days:  You have a temperature by mouth above 102 F (38.9 C), not controlled by medicine.  Shortness of breath.  Weakness after normal activity.  The white part of the eye turns yellow (jaundice).  You have a decrease in the amount of urine or are urinating less often.  Your urine turns a dark color or changes to pink, red, or brown. Document Released: 06/20/2000 Document Revised: 09/15/2011 Document Reviewed: 02/07/2008 [facility] Patient Information 2014 [place], [place].  _______________________________________________________________________

## 2017-11-05 ENCOUNTER — Encounter (HOSPITAL_COMMUNITY)
Admission: RE | Admit: 2017-11-05 | Discharge: 2017-11-05 | Disposition: A | Discharge status: Home / Self Care | Payer: Medicare HMO | Source: Ambulatory Visit | Attending: Orthopedic Surgery | Admitting: Orthopedic Surgery

## 2017-11-05 ENCOUNTER — Other Ambulatory Visit: Payer: Self-pay

## 2017-11-05 ENCOUNTER — Encounter (HOSPITAL_COMMUNITY): Payer: Self-pay

## 2017-11-05 DIAGNOSIS — M1712 Unilateral primary osteoarthritis, left knee: Secondary | ICD-10-CM | POA: Insufficient documentation

## 2017-11-05 DIAGNOSIS — Z01812 Encounter for preprocedural laboratory examination: Secondary | ICD-10-CM | POA: Diagnosis not present

## 2017-11-05 HISTORY — DX: Hypothyroidism, unspecified: E03.9

## 2017-11-05 HISTORY — DX: Unspecified osteoarthritis, unspecified site: M19.90

## 2017-11-05 LAB — COMPREHENSIVE METABOLIC PANEL
ALT: 23 U/L (ref 17–63)
AST: 38 U/L (ref 15–41)
Albumin: 3.8 g/dL (ref 3.5–5.0)
Alkaline Phosphatase: 80 U/L (ref 38–126)
Anion gap: 8 (ref 5–15)
BILIRUBIN TOTAL: 0.8 mg/dL (ref 0.3–1.2)
BUN: 24 mg/dL — AB (ref 6–20)
CALCIUM: 9.3 mg/dL (ref 8.9–10.3)
CO2: 23 mmol/L (ref 22–32)
CREATININE: 0.81 mg/dL (ref 0.61–1.24)
Chloride: 108 mmol/L (ref 101–111)
GFR calc non Af Amer: >60 mL/min (ref >60)
Glucose, Bld: 102 mg/dL — ABNORMAL HIGH (ref 65–99)
Potassium: 4.6 mmol/L (ref 3.5–5.1)
Sodium: 139 mmol/L (ref 135–145)
TOTAL PROTEIN: 7.2 g/dL (ref 6.5–8.1)

## 2017-11-05 LAB — CBC
HEMATOCRIT: 38.5 % — AB (ref 39.0–52.0)
Hemoglobin: 12.8 g/dL — ABNORMAL LOW (ref 13.0–17.0)
MCH: 30.8 pg (ref 26.0–34.0)
MCHC: 33.2 g/dL (ref 30.0–36.0)
MCV: 92.5 fL (ref 78.0–100.0)
Platelets: 219 10*3/uL (ref 150–400)
RBC: 4.16 MIL/uL — AB (ref 4.22–5.81)
RDW: 12.4 % (ref 11.5–15.5)
WBC: 5.6 10*3/uL (ref 4.0–10.5)

## 2017-11-05 LAB — PROTIME-INR
INR: 1.07
Prothrombin Time: 13.8 seconds (ref 11.4–15.2)

## 2017-11-05 LAB — TYPE AND SCREEN
ABO/RH(D): A POS
Antibody Screen: NEGATIVE

## 2017-11-05 LAB — SURGICAL PCR SCREEN
MRSA, PCR: NEGATIVE
Staphylococcus aureus: NEGATIVE

## 2017-11-05 LAB — APTT: aPTT: 26 seconds (ref 24–36)

## 2017-11-06 LAB — ABO/RH: ABO/RH(D): A POS

## 2017-11-15 MED ORDER — BUPIVACAINE LIPOSOME 1.3 % IJ SUSP
20.0000 mL | INTRAMUSCULAR | Status: DC
  Filled 2017-11-15: qty 20

## 2017-11-15 MED ORDER — TRANEXAMIC ACID 1000 MG/10ML IV SOLN
1000.0000 mg | INTRAVENOUS | Status: AC
  Administered 2017-11-16: 1000 mg via INTRAVENOUS
  Filled 2017-11-15: qty 1100

## 2017-11-16 ENCOUNTER — Other Ambulatory Visit: Payer: Self-pay

## 2017-11-16 ENCOUNTER — Inpatient Hospital Stay (HOSPITAL_COMMUNITY): Payer: Medicare HMO | Admitting: Anesthesiology

## 2017-11-16 ENCOUNTER — Encounter (HOSPITAL_COMMUNITY)
Admission: RE | Disposition: A | Discharge status: Home / Self Care | Payer: Self-pay | Source: Ambulatory Visit | Attending: Orthopedic Surgery

## 2017-11-16 ENCOUNTER — Inpatient Hospital Stay (HOSPITAL_COMMUNITY)
Admission: RE | Admit: 2017-11-16 | Discharge: 2017-11-18 | DRG: 470 | Disposition: A | Discharge status: Home / Self Care | Payer: Medicare HMO | Source: Ambulatory Visit | Attending: Orthopedic Surgery | Admitting: Orthopedic Surgery

## 2017-11-16 ENCOUNTER — Encounter (HOSPITAL_COMMUNITY): Payer: Self-pay | Admitting: *Deleted

## 2017-11-16 DIAGNOSIS — M170 Bilateral primary osteoarthritis of knee: Principal | ICD-10-CM | POA: Diagnosis present

## 2017-11-16 DIAGNOSIS — G8918 Other acute postprocedural pain: Secondary | ICD-10-CM | POA: Diagnosis not present

## 2017-11-16 DIAGNOSIS — Z6829 Body mass index (BMI) 29.0-29.9, adult: Secondary | ICD-10-CM | POA: Diagnosis not present

## 2017-11-16 DIAGNOSIS — M1712 Unilateral primary osteoarthritis, left knee: Secondary | ICD-10-CM | POA: Diagnosis not present

## 2017-11-16 DIAGNOSIS — M1710 Unilateral primary osteoarthritis, unspecified knee: Secondary | ICD-10-CM | POA: Diagnosis present

## 2017-11-16 DIAGNOSIS — E669 Obesity, unspecified: Secondary | ICD-10-CM | POA: Diagnosis not present

## 2017-11-16 DIAGNOSIS — M179 Osteoarthritis of knee, unspecified: Secondary | ICD-10-CM | POA: Diagnosis present

## 2017-11-16 DIAGNOSIS — Z79899 Other long term (current) drug therapy: Secondary | ICD-10-CM

## 2017-11-16 DIAGNOSIS — E039 Hypothyroidism, unspecified: Secondary | ICD-10-CM | POA: Diagnosis not present

## 2017-11-16 DIAGNOSIS — R269 Unspecified abnormalities of gait and mobility: Secondary | ICD-10-CM | POA: Diagnosis not present

## 2017-11-16 HISTORY — PX: STERIOD INJECTION: SHX5046

## 2017-11-16 HISTORY — PX: TOTAL KNEE ARTHROPLASTY: SHX125

## 2017-11-16 SURGERY — ARTHROPLASTY, KNEE, TOTAL
Anesthesia: Monitor Anesthesia Care | Site: Knee | Laterality: Right

## 2017-11-16 MED ORDER — OXYCODONE HCL 5 MG/5ML PO SOLN
5.0000 mg | Freq: Once | ORAL | Status: DC | PRN
  Filled 2017-11-16: qty 5

## 2017-11-16 MED ORDER — ACETAMINOPHEN 10 MG/ML IV SOLN
1000.0000 mg | Freq: Once | INTRAVENOUS | Status: AC
  Administered 2017-11-16: 1000 mg via INTRAVENOUS

## 2017-11-16 MED ORDER — PROPOFOL 500 MG/50ML IV EMUL
INTRAVENOUS | Status: DC | PRN
  Administered 2017-11-16: 100 ug/kg/min via INTRAVENOUS

## 2017-11-16 MED ORDER — TRAMADOL HCL 50 MG PO TABS: 50.0000 mg | ORAL_TABLET | Freq: Four times a day (QID) | ORAL | 0 refills | Status: DC | PRN

## 2017-11-16 MED ORDER — DIPHENHYDRAMINE HCL 12.5 MG/5ML PO ELIX
12.5000 mg | ORAL_SOLUTION | ORAL | Status: DC | PRN
  Administered 2017-11-17: 25 mg via ORAL
  Filled 2017-11-16: qty 10

## 2017-11-16 MED ORDER — FENTANYL CITRATE (PF) 100 MCG/2ML IJ SOLN: 25.0000 ug | INTRAMUSCULAR | Status: DC | PRN

## 2017-11-16 MED ORDER — SODIUM CHLORIDE 0.9 % IR SOLN
Status: DC | PRN
  Administered 2017-11-16: 1000 mL

## 2017-11-16 MED ORDER — OXYCODONE HCL 5 MG PO TABS
5.0000 mg | ORAL_TABLET | ORAL | Status: DC | PRN
  Administered 2017-11-16 – 2017-11-18 (×6): 10 mg via ORAL
  Filled 2017-11-16 (×4): qty 2
  Filled 2017-11-16: qty 1
  Filled 2017-11-16 (×2): qty 2

## 2017-11-16 MED ORDER — PROPOFOL 10 MG/ML IV BOLUS
INTRAVENOUS | Status: AC
  Filled 2017-11-16: qty 20

## 2017-11-16 MED ORDER — HYDROMORPHONE HCL 1 MG/ML IJ SOLN: 0.5000 mg | INTRAMUSCULAR | Status: DC | PRN

## 2017-11-16 MED ORDER — SODIUM CHLORIDE 0.9 % IJ SOLN
INTRAMUSCULAR | Status: AC
  Filled 2017-11-16: qty 50

## 2017-11-16 MED ORDER — STERILE WATER FOR IRRIGATION IR SOLN
Status: DC | PRN
  Administered 2017-11-16: 2000 mL

## 2017-11-16 MED ORDER — POLYETHYLENE GLYCOL 3350 17 G PO PACK
17.0000 g | PACK | Freq: Every day | ORAL | Status: DC | PRN
  Administered 2017-11-18: 17 g via ORAL
  Filled 2017-11-16: qty 1

## 2017-11-16 MED ORDER — FENTANYL CITRATE (PF) 100 MCG/2ML IJ SOLN
INTRAMUSCULAR | Status: AC
  Filled 2017-11-16: qty 2

## 2017-11-16 MED ORDER — FLEET ENEMA 7-19 GM/118ML RE ENEM: 1.0000 | ENEMA | Freq: Once | RECTAL | Status: DC | PRN

## 2017-11-16 MED ORDER — SODIUM CHLORIDE 0.9 % IJ SOLN
INTRAMUSCULAR | Status: AC
  Filled 2017-11-16: qty 10

## 2017-11-16 MED ORDER — METHYLPREDNISOLONE ACETATE 80 MG/ML IJ SUSP
INTRAMUSCULAR | Status: DC | PRN
  Administered 2017-11-16: 80 mg

## 2017-11-16 MED ORDER — SODIUM CHLORIDE 0.9 % IJ SOLN
INTRAMUSCULAR | Status: DC | PRN
  Administered 2017-11-16: 60 mL

## 2017-11-16 MED ORDER — GABAPENTIN 300 MG PO CAPS
ORAL_CAPSULE | ORAL | Status: AC
  Administered 2017-11-16: 300 mg via ORAL
  Filled 2017-11-16: qty 1

## 2017-11-16 MED ORDER — ACETAMINOPHEN 10 MG/ML IV SOLN
INTRAVENOUS | Status: AC
  Filled 2017-11-16: qty 100

## 2017-11-16 MED ORDER — CEFAZOLIN SODIUM-DEXTROSE 2-4 GM/100ML-% IV SOLN
2.0000 g | Freq: Four times a day (QID) | INTRAVENOUS | Status: AC
  Administered 2017-11-16 (×2): 2 g via INTRAVENOUS
  Filled 2017-11-16 (×2): qty 100

## 2017-11-16 MED ORDER — ROPIVACAINE HCL 7.5 MG/ML IJ SOLN
INTRAMUSCULAR | Status: DC | PRN
  Administered 2017-11-16: 20 mL via PERINEURAL

## 2017-11-16 MED ORDER — METHOCARBAMOL 500 MG PO TABS: 500.0000 mg | ORAL_TABLET | Freq: Four times a day (QID) | ORAL | 0 refills | Status: DC | PRN

## 2017-11-16 MED ORDER — METOCLOPRAMIDE HCL 5 MG PO TABS: 5.0000 mg | ORAL_TABLET | Freq: Three times a day (TID) | ORAL | Status: DC | PRN

## 2017-11-16 MED ORDER — OXYCODONE HCL 5 MG PO TABS: 5.0000 mg | ORAL_TABLET | Freq: Once | ORAL | Status: DC | PRN

## 2017-11-16 MED ORDER — MENTHOL 3 MG MT LOZG: 1.0000 | LOZENGE | OROMUCOSAL | Status: DC | PRN

## 2017-11-16 MED ORDER — CHLORHEXIDINE GLUCONATE 4 % EX LIQD: 60.0000 mL | Freq: Once | CUTANEOUS | Status: DC

## 2017-11-16 MED ORDER — LEVOTHYROXINE SODIUM 137 MCG PO TABS
137.0000 ug | ORAL_TABLET | Freq: Every day | ORAL | Status: DC
  Administered 2017-11-17 – 2017-11-18 (×2): 137 ug via ORAL
  Filled 2017-11-16 (×2): qty 1

## 2017-11-16 MED ORDER — ONDANSETRON HCL 4 MG/2ML IJ SOLN
INTRAMUSCULAR | Status: AC
  Filled 2017-11-16: qty 2

## 2017-11-16 MED ORDER — ONDANSETRON HCL 4 MG/2ML IJ SOLN
INTRAMUSCULAR | Status: DC | PRN
  Administered 2017-11-16: 4 mg via INTRAVENOUS

## 2017-11-16 MED ORDER — SODIUM CHLORIDE 0.9 % IV SOLN
INTRAVENOUS | Status: DC
  Administered 2017-11-16: 100 mL/h via INTRAVENOUS

## 2017-11-16 MED ORDER — FENTANYL CITRATE (PF) 100 MCG/2ML IJ SOLN
50.0000 ug | INTRAMUSCULAR | Status: DC
  Administered 2017-11-16: 50 ug via INTRAVENOUS

## 2017-11-16 MED ORDER — ASPIRIN EC 325 MG PO TBEC
325.0000 mg | DELAYED_RELEASE_TABLET | Freq: Two times a day (BID) | ORAL | Status: DC
  Administered 2017-11-17 – 2017-11-18 (×3): 325 mg via ORAL
  Filled 2017-11-16 (×3): qty 1

## 2017-11-16 MED ORDER — TRANEXAMIC ACID 1000 MG/10ML IV SOLN
1000.0000 mg | Freq: Once | INTRAVENOUS | Status: AC
  Administered 2017-11-16: 1000 mg via INTRAVENOUS
  Filled 2017-11-16: qty 1100

## 2017-11-16 MED ORDER — ASPIRIN 325 MG PO TBEC: 325.0000 mg | DELAYED_RELEASE_TABLET | Freq: Two times a day (BID) | ORAL | 0 refills | Status: DC

## 2017-11-16 MED ORDER — CEFAZOLIN SODIUM-DEXTROSE 2-4 GM/100ML-% IV SOLN
2.0000 g | INTRAVENOUS | Status: AC
  Administered 2017-11-16: 2 g via INTRAVENOUS

## 2017-11-16 MED ORDER — ACETAMINOPHEN 325 MG PO TABS: 325.0000 mg | ORAL_TABLET | ORAL | Status: DC | PRN

## 2017-11-16 MED ORDER — METOCLOPRAMIDE HCL 5 MG/ML IJ SOLN: 5.0000 mg | Freq: Three times a day (TID) | INTRAMUSCULAR | Status: DC | PRN

## 2017-11-16 MED ORDER — LACTATED RINGERS IV SOLN
INTRAVENOUS | Status: DC
  Administered 2017-11-16 (×2): via INTRAVENOUS

## 2017-11-16 MED ORDER — BISACODYL 10 MG RE SUPP: 10.0000 mg | Freq: Every day | RECTAL | Status: DC | PRN

## 2017-11-16 MED ORDER — PHENOL 1.4 % MT LIQD
1.0000 | OROMUCOSAL | Status: DC | PRN
  Filled 2017-11-16: qty 177

## 2017-11-16 MED ORDER — DEXAMETHASONE SODIUM PHOSPHATE 10 MG/ML IJ SOLN
INTRAMUSCULAR | Status: AC
  Filled 2017-11-16: qty 1

## 2017-11-16 MED ORDER — DEXAMETHASONE SODIUM PHOSPHATE 10 MG/ML IJ SOLN
10.0000 mg | Freq: Once | INTRAMUSCULAR | Status: AC
  Administered 2017-11-16: 10 mg via INTRAVENOUS

## 2017-11-16 MED ORDER — CEFAZOLIN SODIUM-DEXTROSE 2-4 GM/100ML-% IV SOLN
INTRAVENOUS | Status: AC
  Filled 2017-11-16: qty 100

## 2017-11-16 MED ORDER — BUPIVACAINE LIPOSOME 1.3 % IJ SUSP
INTRAMUSCULAR | Status: DC | PRN
  Administered 2017-11-16: 20 mL

## 2017-11-16 MED ORDER — METHOCARBAMOL 1000 MG/10ML IJ SOLN
500.0000 mg | Freq: Four times a day (QID) | INTRAVENOUS | Status: DC | PRN
  Administered 2017-11-16: 500 mg via INTRAVENOUS
  Filled 2017-11-16: qty 550

## 2017-11-16 MED ORDER — METHYLPREDNISOLONE ACETATE 40 MG/ML IJ SUSP
INTRAMUSCULAR | Status: AC
  Filled 2017-11-16: qty 2

## 2017-11-16 MED ORDER — DEXAMETHASONE SODIUM PHOSPHATE 10 MG/ML IJ SOLN
10.0000 mg | Freq: Once | INTRAMUSCULAR | Status: AC
  Administered 2017-11-17: 10 mg via INTRAVENOUS
  Filled 2017-11-16: qty 1

## 2017-11-16 MED ORDER — ACETAMINOPHEN 160 MG/5ML PO SOLN: 325.0000 mg | ORAL | Status: DC | PRN

## 2017-11-16 MED ORDER — BUPIVACAINE IN DEXTROSE 0.75-8.25 % IT SOLN
INTRATHECAL | Status: DC | PRN
  Administered 2017-11-16: 15 mg via INTRATHECAL

## 2017-11-16 MED ORDER — OXYCODONE HCL 5 MG PO TABS: 5.0000 mg | ORAL_TABLET | Freq: Four times a day (QID) | ORAL | 0 refills | Status: DC | PRN

## 2017-11-16 MED ORDER — ONDANSETRON HCL 4 MG PO TABS: 4.0000 mg | ORAL_TABLET | Freq: Four times a day (QID) | ORAL | Status: DC | PRN

## 2017-11-16 MED ORDER — OXYCODONE HCL 5 MG PO TABS: 10.0000 mg | ORAL_TABLET | ORAL | Status: DC | PRN

## 2017-11-16 MED ORDER — PROPOFOL 10 MG/ML IV BOLUS
INTRAVENOUS | Status: AC
  Filled 2017-11-16: qty 40

## 2017-11-16 MED ORDER — ACETAMINOPHEN 500 MG PO TABS
1000.0000 mg | ORAL_TABLET | Freq: Four times a day (QID) | ORAL | Status: AC
  Administered 2017-11-16 – 2017-11-17 (×4): 1000 mg via ORAL
  Filled 2017-11-16 (×4): qty 2

## 2017-11-16 MED ORDER — MIDAZOLAM HCL 2 MG/2ML IJ SOLN
1.0000 mg | INTRAMUSCULAR | Status: DC
  Administered 2017-11-16: 1 mg via INTRAVENOUS

## 2017-11-16 MED ORDER — TRAMADOL HCL 50 MG PO TABS
50.0000 mg | ORAL_TABLET | Freq: Four times a day (QID) | ORAL | Status: DC | PRN
Start: 2017-11-16 — End: 2017-11-18
  Administered 2017-11-17 (×2): 100 mg via ORAL
  Filled 2017-11-16 (×2): qty 2

## 2017-11-16 MED ORDER — ONDANSETRON HCL 4 MG/2ML IJ SOLN: 4.0000 mg | Freq: Four times a day (QID) | INTRAMUSCULAR | Status: DC | PRN

## 2017-11-16 MED ORDER — MIDAZOLAM HCL 2 MG/2ML IJ SOLN
INTRAMUSCULAR | Status: AC
  Filled 2017-11-16: qty 2

## 2017-11-16 MED ORDER — DOCUSATE SODIUM 100 MG PO CAPS
100.0000 mg | ORAL_CAPSULE | Freq: Two times a day (BID) | ORAL | Status: DC
  Administered 2017-11-16 – 2017-11-18 (×4): 100 mg via ORAL
  Filled 2017-11-16 (×4): qty 1

## 2017-11-16 MED ORDER — GABAPENTIN 300 MG PO CAPS
300.0000 mg | ORAL_CAPSULE | Freq: Once | ORAL | Status: AC
  Administered 2017-11-16: 300 mg via ORAL

## 2017-11-16 MED ORDER — METHOCARBAMOL 500 MG PO TABS
500.0000 mg | ORAL_TABLET | Freq: Four times a day (QID) | ORAL | Status: DC | PRN
  Administered 2017-11-17 – 2017-11-18 (×3): 500 mg via ORAL
  Filled 2017-11-16 (×3): qty 1

## 2017-11-16 SURGICAL SUPPLY — 52 items
BAG DECANTER FOR FLEXI CONT (MISCELLANEOUS) ×3 IMPLANT
BAG ZIPLOCK 12X15 (MISCELLANEOUS) ×3 IMPLANT
BANDAGE ACE 6X5 VEL STRL LF (GAUZE/BANDAGES/DRESSINGS) ×3 IMPLANT
BLADE SAG 18X100X1.27 (BLADE) ×3 IMPLANT
BLADE SAW SGTL 11.0X1.19X90.0M (BLADE) ×3 IMPLANT
BOWL SMART MIX CTS (DISPOSABLE) ×3 IMPLANT
CAPT KNEE TOTAL 3 ATTUNE ×3 IMPLANT
CEMENT HV SMART SET (Cement) ×6 IMPLANT
COVER SURGICAL LIGHT HANDLE (MISCELLANEOUS) ×3 IMPLANT
CUFF TOURN SGL QUICK 34 (TOURNIQUET CUFF) ×1
CUFF TRNQT CYL 34X4X40X1 (TOURNIQUET CUFF) ×2 IMPLANT
DECANTER SPIKE VIAL GLASS SM (MISCELLANEOUS) ×3 IMPLANT
DRAPE U-SHAPE 47X51 STRL (DRAPES) ×3 IMPLANT
DRSG ADAPTIC 3X8 NADH LF (GAUZE/BANDAGES/DRESSINGS) ×3 IMPLANT
DRSG PAD ABDOMINAL 8X10 ST (GAUZE/BANDAGES/DRESSINGS) ×3 IMPLANT
DURAPREP 26ML APPLICATOR (WOUND CARE) ×3 IMPLANT
ELECT REM PT RETURN 15FT ADLT (MISCELLANEOUS) ×3 IMPLANT
EVACUATOR 1/8 PVC DRAIN (DRAIN) ×3 IMPLANT
GAUZE SPONGE 4X4 12PLY STRL (GAUZE/BANDAGES/DRESSINGS) ×3 IMPLANT
GLOVE BIO SURGEON STRL SZ8 (GLOVE) ×6 IMPLANT
GLOVE BIOGEL PI IND STRL 6.5 (GLOVE) IMPLANT
GLOVE BIOGEL PI IND STRL 8 (GLOVE) ×2 IMPLANT
GLOVE BIOGEL PI INDICATOR 6.5 (GLOVE)
GLOVE BIOGEL PI INDICATOR 8 (GLOVE) ×1
GLOVE SURG SS PI 6.5 STRL IVOR (GLOVE) ×24 IMPLANT
GOWN STRL REUS W/TWL LRG LVL3 (GOWN DISPOSABLE) ×15 IMPLANT
GOWN STRL REUS W/TWL XL LVL3 (GOWN DISPOSABLE) IMPLANT
HANDPIECE INTERPULSE COAX TIP (DISPOSABLE) ×1
HOLDER FOLEY CATH W/STRAP (MISCELLANEOUS) IMPLANT
IMMOBILIZER KNEE 20 (SOFTGOODS) ×6 IMPLANT
IMMOBILIZER KNEE 20 THIGH 36 (SOFTGOODS) ×2 IMPLANT
MANIFOLD NEPTUNE II (INSTRUMENTS) ×3 IMPLANT
NDL SAFETY ECLIPSE 18X1.5 (NEEDLE) IMPLANT
NEEDLE HYPO 18GX1.5 SHARP (NEEDLE)
NS IRRIG 1000ML POUR BTL (IV SOLUTION) ×3 IMPLANT
PACK TOTAL KNEE CUSTOM (KITS) ×3 IMPLANT
PADDING CAST COTTON 6X4 STRL (CAST SUPPLIES) ×3 IMPLANT
POSITIONER SURGICAL ARM (MISCELLANEOUS) ×3 IMPLANT
SET HNDPC FAN SPRY TIP SCT (DISPOSABLE) ×2 IMPLANT
STRIP CLOSURE SKIN 1/2X4 (GAUZE/BANDAGES/DRESSINGS) ×3 IMPLANT
SUT MNCRL AB 4-0 PS2 18 (SUTURE) ×3 IMPLANT
SUT STRATAFIX 0 PDS 27 VIOLET (SUTURE) ×3
SUT VIC AB 2-0 CT1 27 (SUTURE) ×3
SUT VIC AB 2-0 CT1 TAPERPNT 27 (SUTURE) ×6 IMPLANT
SUTURE STRATFX 0 PDS 27 VIOLET (SUTURE) ×2 IMPLANT
SYR 3ML LL SCALE MARK (SYRINGE) IMPLANT
SYR 50ML LL SCALE MARK (SYRINGE) ×3 IMPLANT
TRAY FOLEY METER SIL LF 16FR (CATHETERS) ×3 IMPLANT
TRAY FOLEY MTR SLVR 16FR STAT (SET/KITS/TRAYS/PACK) IMPLANT
WATER STERILE IRR 1000ML POUR (IV SOLUTION) ×6 IMPLANT
WRAP KNEE MAXI GEL POST OP (GAUZE/BANDAGES/DRESSINGS) ×3 IMPLANT
YANKAUER SUCT BULB TIP 10FT TU (MISCELLANEOUS) ×3 IMPLANT

## 2017-11-16 NOTE — Op Note (Addendum)
OPERATIVE REPORT-TOTAL KNEE ARTHROPLASTY   Pre-operative diagnosis- Osteoarthritis  Bilateral knee(s)  Post-operative diagnosis- Osteoarthritis Bilateral knee(s)  Procedure-  Left  Total Knee Arthroplasty   Right knee cortisone injection  Surgeon- Jonathan Plover. Hae Ahlers, MD  Assistant- Ardeen Jourdain, PA-C   Anesthesia-  Adductor canal block and spinal  EBL-30 mL   Drains Hemovac  Tourniquet time-  Total Tourniquet Time Documented: Thigh (Left) - 39 minutes Total: Thigh (Left) - 39 minutes     Complications- None  Condition-PACU - hemodynamically stable.   Brief Clinical Note   Jonathan Ochoa is a 67 y.o. year old male with end stage OA of his left knee with progressively worsening pain and dysfunction. He has constant pain, with activity and at rest and significant functional deficits with difficulties even with ADLs. He has had extensive non-op management including analgesics, injections of cortisone and viscosupplements, and home exercise program, but remains in significant pain with significant dysfunction. Radiographs show bone on bone arthritis medial and patellofemoral. He presents now for left Total Knee Arthroplasty.  He also has severe osteoarthritis of his right knee and requests a cortisone injection  Procedure in detail---   The patient is brought into the operating room and positioned supine on the operating table. After successful administration of  Adductor canal block and spinal,   a tourniquet is placed high on the  Left thigh(s) and the lower extremity is prepped and draped in the usual sterile fashion. Time out is performed by the operating team and then the  Left lower extremity is wrapped in Esmarch, knee flexed and the tourniquet inflated to 300 mmHg.       A midline incision is made with a ten blade through the subcutaneous tissue to the level of the extensor mechanism. A fresh blade is used to make a medial parapatellar arthrotomy. Soft tissue over the  proximal medial tibia is subperiosteally elevated to the joint line with a knife and into the semimembranosus bursa with a Cobb elevator. Soft tissue over the proximal lateral tibia is elevated with attention being paid to avoiding the patellar tendon on the tibial tubercle. The patella is everted, knee flexed 90 degrees and the ACL and PCL are removed. Findings are bone on bone medial and patellofemoral with large global osteophytes        The drill is used to create a starting hole in the distal femur and the canal is thoroughly irrigated with sterile saline to remove the fatty contents. The 5 degree Left  valgus alignment guide is placed into the femoral canal and the distal femoral cutting block is pinned to remove 9 mm off the distal femur. Resection is made with an oscillating saw.      The tibia is subluxed forward and the menisci are removed. The extramedullary alignment guide is placed referencing proximally at the medial aspect of the tibial tubercle and distally along the second metatarsal axis and tibial crest. The block is pinned to remove 83mm off the more deficient medial  side. Resection is made with an oscillating saw. Size 8is the most appropriate size for the tibia and the proximal tibia is prepared with the modular drill and keel punch for that size.      The femoral sizing guide is placed and size 8 is most appropriate. Rotation is marked off the epicondylar axis and confirmed by creating a rectangular flexion gap at 90 degrees. The size 8 cutting block is pinned in this rotation and the anterior, posterior and  chamfer cuts are made with the oscillating saw. The intercondylar block is then placed and that cut is made.      Trial size 8 tibial component, trial size 8 posterior stabilized femur and a 7  mm posterior stabilized rotating platform insert trial is placed. Full extension is achieved with excellent varus/valgus and anterior/posterior balance throughout full range of motion. The  patella is everted and thickness measured to be 27  mm. Free hand resection is taken to 15 mm, a 41 template is placed, lug holes are drilled, trial patella is placed, and it tracks normally. Osteophytes are removed off the posterior femur with the trial in place. All trials are removed and the cut bone surfaces prepared with pulsatile lavage. Cement is mixed and once ready for implantation, the size 8 tibial implant, size  8 posterior stabilized femoral component, and the size 41 patella are cemented in place and the patella is held with the clamp. The trial insert is placed and the knee held in full extension. The Exparel (20 ml mixed with 60 ml saline) is injected into the extensor mechanism, posterior capsule, medial and lateral gutters and subcutaneous tissues.  All extruded cement is removed and once the cement is hard the permanent 7 mm posterior stabilized rotating platform insert is placed into the tibial tray.      The wound is copiously irrigated with saline solution and the extensor mechanism closed over a hemovac drain with #1 V-loc suture. The tourniquet is released for a total tourniquet time of 39  minutes. Flexion against gravity is 140 degrees and the patella tracks normally. Subcutaneous tissue is closed with 2.0 vicryl and subcuticular with running 4.0 Monocryl. The incision is cleaned and dried and steri-strips and a bulky sterile dressing are applied. The limb is placed into a knee immobilizer.      I then prepped the right knee with alcohol and injected the joint with 80 mg Depomedrol without problems. The patient is then awakened and transported to recovery in stable condition.      Please note that a surgical assistant was a medical necessity for this procedure in order to perform it in a safe and expeditious manner. Surgical assistant was necessary to retract the ligaments and vital neurovascular structures to prevent injury to them and also necessary for proper positioning of the limb  to allow for anatomic placement of the prosthesis.   Jonathan Plover Tieshia Rettinger, MD    11/16/2017, 11:38 AM

## 2017-11-16 NOTE — Progress Notes (Signed)
Assisted Dr. Moser with left, ultrasound guided, adductor canal block. Side rails up, monitors on throughout procedure. See vital signs in flow sheet. Tolerated Procedure well.  

## 2017-11-16 NOTE — Anesthesia Preprocedure Evaluation (Signed)
Anesthesia Evaluation  Patient identified by MRN, date of birth, ID band Patient awake    Reviewed: Allergy & Precautions, NPO status , Patient's Chart, lab work & pertinent test results  History of Anesthesia Complications Negative for: history of anesthetic complications  Airway Mallampati: II  TM Distance: >3 FB Neck ROM: Full    Dental  (+) Upper Dentures, Missing,    Pulmonary neg pulmonary ROS,    breath sounds clear to auscultation       Cardiovascular negative cardio ROS   Rhythm:Regular     Neuro/Psych negative neurological ROS  negative psych ROS   GI/Hepatic negative GI ROS, Neg liver ROS,   Endo/Other  Hypothyroidism   Renal/GU negative Renal ROS     Musculoskeletal  (+) Arthritis ,   Abdominal   Peds  Hematology negative hematology ROS (+)   Anesthesia Other Findings   Reproductive/Obstetrics                             Anesthesia Physical Anesthesia Plan  ASA: II  Anesthesia Plan: MAC, Spinal and Regional   Post-op Pain Management:    Induction:   PONV Risk Score and Plan: 1 and Treatment may vary due to age or medical condition  Airway Management Planned: Nasal Cannula  Additional Equipment: None  Intra-op Plan:   Post-operative Plan:   Informed Consent: I have reviewed the patients History and Physical, chart, labs and discussed the procedure including the risks, benefits and alternatives for the proposed anesthesia with the patient or authorized representative who has indicated his/her understanding and acceptance.   Dental advisory given  Plan Discussed with: CRNA and Surgeon  Anesthesia Plan Comments:         Anesthesia Quick Evaluation

## 2017-11-16 NOTE — Anesthesia Procedure Notes (Signed)
Spinal  Patient location during procedure: OR Start time: 11/16/2017 10:30 AM End time: 11/16/2017 10:32 AM Staffing Resident/CRNA: Lind Covert, CRNA Performed: resident/CRNA  Preanesthetic Checklist Completed: patient identified, site marked, surgical consent, pre-op evaluation, timeout performed, IV checked, risks and benefits discussed and monitors and equipment checked Spinal Block Patient position: sitting Prep: Betadine Patient monitoring: heart rate, cardiac monitor, continuous pulse ox and blood pressure Approach: midline Location: L3-4 Injection technique: single-shot Needle Needle type: Pencan  Needle length: 10 cm Needle insertion depth: 7 cm Assessment Sensory level: T6 Additional Notes Timeout performed. SAB kit date checked. SAB without difficulty

## 2017-11-16 NOTE — Anesthesia Procedure Notes (Signed)
Anesthesia Regional Block: Adductor canal block   Pre-Anesthetic Checklist: ,, timeout performed, Correct Patient, Correct Site, Correct Laterality, Correct Procedure, Correct Position, site marked, Risks and benefits discussed,  Surgical consent,  Pre-op evaluation,  At surgeon's request and post-op pain management  Laterality: Lower and Left  Prep: chloraprep       Needles:  Injection technique: Single-shot  Needle Type: Echogenic Stimulator Needle          Additional Needles:   Procedures:,,,, ultrasound used (permanent image in chart),,,,  Narrative:  Start time: 11/16/2017 10:08 AM End time: 11/16/2017 10:10 AM Injection made incrementally with aspirations every 5 mL.  Performed by: Personally  Anesthesiologist: Oleta Mouse, MD  Additional Notes: H+P and labs reviewed, risks and benefits discussed with patient, procedure tolerated well without complications

## 2017-11-16 NOTE — Interval H&P Note (Signed)
History and Physical Interval Note:  11/16/2017 8:16 AM  Jonathan Ochoa  has presented today for surgery, with the diagnosis of Osteoarthritis Left knee  The various methods of treatment have been discussed with the patient and family. After consideration of risks, benefits and other options for treatment, the patient has consented to  Procedure(s): LEFT TOTAL KNEE ARTHROPLASTY (Left) CORTISONE  INJECTION RIGHT KNEE (Right) as a surgical intervention .  The patient's history has been reviewed, patient examined, no change in status, stable for surgery.  I have reviewed the patient's chart and labs.  Questions were answered to the patient's satisfaction.     [name]

## 2017-11-16 NOTE — Discharge Instructions (Signed)
° °Dr. Frank Aluisio °Total Joint Specialist °[facility] °[address] °[place], [place] [address] °[phone] ° °TOTAL KNEE REPLACEMENT POSTOPERATIVE DIRECTIONS ° °Knee Rehabilitation, Guidelines Following Surgery  °Results after knee surgery are often greatly improved when you follow the exercise, range of motion and muscle strengthening exercises prescribed by your doctor. Safety measures are also important to protect the knee from further injury. Any time any of these exercises cause you to have increased pain or swelling in your knee joint, decrease the amount until you are comfortable again and slowly increase them. If you have problems or questions, call your caregiver or physical therapist for advice.  ° °HOME CARE INSTRUCTIONS  °Remove items at home which could result in a fall. This includes throw rugs or furniture in walking pathways.  °· ICE to the affected knee every three hours for 30 minutes at a time and then as needed for pain and swelling.  Continue to use ice on the knee for pain and swelling from surgery. You may notice swelling that will progress down to the foot and ankle.  This is normal after surgery.  Elevate the leg when you are not up walking on it.   °· Continue to use the breathing machine which will help keep your temperature down.  It is common for your temperature to cycle up and down following surgery, especially at night when you are not up moving around and exerting yourself.  The breathing machine keeps your lungs expanded and your temperature down. °· Do not place pillow under knee, focus on keeping the knee straight while resting ° °DIET °You may resume your previous home diet once your are discharged from the hospital. ° °DRESSING / WOUND CARE / SHOWERING °You may change your dressing every day with sterile gauze.  Please use good hand washing techniques before changing the dressing.  Do not use any lotions or creams on the incision until instructed by your  surgeon. °You may start showering once you are discharged home but do not submerge the incision under water. Just pat the incision dry and apply a dry gauze dressing on daily. °Change the surgical dressing daily and reapply a dry dressing each time. ° °ACTIVITY °Walk with your walker as instructed. °Use walker as long as suggested by your caregivers. °Avoid periods of inactivity such as sitting longer than an hour when not asleep. This helps prevent blood clots.  °You may resume a sexual relationship in one month or when given the OK by your doctor.  °You may return to work once you are cleared by your doctor.  °Do not drive a car for 6 weeks or until released by you surgeon.  °Do not drive while taking narcotics. ° °WEIGHT BEARING °Weight bearing as tolerated with assist device (walker, cane, etc) as directed, use it as long as suggested by your surgeon or therapist, typically at least 4-6 weeks. ° °POSTOPERATIVE CONSTIPATION PROTOCOL °Constipation - defined medically as fewer than three stools per week and severe constipation as less than one stool per week. ° °One of the most common issues patients have following surgery is constipation.  Even if you have a regular bowel pattern at home, your normal regimen is likely to be disrupted due to multiple reasons following surgery.  Combination of anesthesia, postoperative narcotics, change in appetite and fluid intake all can affect your bowels.  In order to avoid complications following surgery, here are some recommendations in order to help you during your recovery period. ° °  Colace (docusate) - Pick up an over-the-counter form of Colace or another stool softener and take twice a day as long as you are requiring postoperative pain medications.  Take with a full glass of water daily.  If you experience loose stools or diarrhea, hold the colace until you stool forms back up.  If your symptoms do not get better within 1 week or if they get worse, check with your  doctor. ° °Dulcolax (bisacodyl) - Pick up over-the-counter and take as directed by the product packaging as needed to assist with the movement of your bowels.  Take with a full glass of water.  Use this product as needed if not relieved by Colace only.  ° °MiraLax (polyethylene glycol) - Pick up over-the-counter to have on hand.  MiraLax is a solution that will increase the amount of water in your bowels to assist with bowel movements.  Take as directed and can mix with a glass of water, juice, soda, coffee, or tea.  Take if you go more than two days without a movement. °Do not use MiraLax more than once per day. Call your doctor if you are still constipated or irregular after using this medication for 7 days in a row. ° °If you continue to have problems with postoperative constipation, please contact the office for further assistance and recommendations.  If you experience "the worst abdominal pain ever" or develop nausea or vomiting, please contact the office immediatly for further recommendations for treatment. ° °ITCHING ° If you experience itching with your medications, try taking only a single pain pill, or even half a pain pill at a time.  You can also use Benadryl over the counter for itching or also to help with sleep.  ° °TED HOSE STOCKINGS °Wear the elastic stockings on both legs for three weeks following surgery during the day but you may remove then at night for sleeping. ° °MEDICATIONS °See your medication summary on the “After Visit Summary” that the nursing staff will review with you prior to discharge.  You may have some home medications which will be placed on hold until you complete the course of blood thinner medication.  It is important for you to complete the blood thinner medication as prescribed by your surgeon.  Continue your approved medications as instructed at time of discharge. ° °PRECAUTIONS °If you experience chest pain or shortness of breath - call 911 immediately for transfer to the  hospital emergency department.  °If you develop a fever greater that 101 F, purulent drainage from wound, increased redness or drainage from wound, foul odor from the wound/dressing, or calf pain - CONTACT YOUR SURGEON.   °                                                °FOLLOW-UP APPOINTMENTS °Make sure you keep all of your appointments after your operation with your surgeon and caregivers. You should call the office at the above phone number and make an appointment for approximately two weeks after the date of your surgery or on the date instructed by your surgeon outlined in the "After Visit Summary". ° ° °RANGE OF MOTION AND STRENGTHENING EXERCISES  °Rehabilitation of the knee is important following a knee injury or an operation. After just a few days of immobilization, the muscles of the thigh which control the knee become weakened and   shrink (atrophy). Knee exercises are designed to build up the tone and strength of the thigh muscles and to improve knee motion. Often times heat used for twenty to thirty minutes before working out will loosen up your tissues and help with improving the range of motion but do not use heat for the first two weeks following surgery. These exercises can be done on a training (exercise) mat, on the floor, on a table or on a bed. Use what ever works the best and is most comfortable for you Knee exercises include:  °Leg Lifts - While your knee is still immobilized in a splint or cast, you can do straight leg raises. Lift the leg to 60 degrees, hold for 3 sec, and slowly lower the leg. Repeat 10-20 times 2-3 times daily. Perform this exercise against resistance later as your knee gets better.  °Quad and Hamstring Sets - Tighten up the muscle on the front of the thigh (Quad) and hold for 5-10 sec. Repeat this 10-20 times hourly. Hamstring sets are done by pushing the foot backward against an object and holding for 5-10 sec. Repeat as with quad sets.  °· Leg Slides: Lying on your back,  slowly slide your foot toward your buttocks, bending your knee up off the floor (only go as far as is comfortable). Then slowly slide your foot back down until your leg is flat on the floor again. °· Angel Wings: Lying on your back spread your legs to the side as far apart as you can without causing discomfort.  °A rehabilitation program following serious knee injuries can speed recovery and prevent re-injury in the future due to weakened muscles. Contact your doctor or a physical therapist for more information on knee rehabilitation.  ° °IF YOU ARE TRANSFERRED TO A SKILLED REHAB FACILITY °If the patient is transferred to a skilled rehab facility following release from the hospital, a list of the current medications will be sent to the facility for the patient to continue.  When discharged from the skilled rehab facility, please have the facility set up the patient's Home Health Physical Therapy prior to being released. Also, the skilled facility will be responsible for providing the patient with their medications at time of release from the facility to include their pain medication, the muscle relaxants, and their blood thinner medication. If the patient is still at the rehab facility at time of the two week follow up appointment, the skilled rehab facility will also need to assist the patient in arranging follow up appointment in our office and any transportation needs. ° °MAKE SURE YOU:  °Understand these instructions.  °Get help right away if you are not doing well or get worse.  ° ° °Pick up stool softner and laxative for home use following surgery while on pain medications. °Do not submerge incision under water. °Please use good hand washing techniques while changing dressing each day. °May shower starting three days after surgery. °Please use a clean towel to pat the incision dry following showers. °Continue to use ice for pain and swelling after surgery. °Do not use any lotions or creams on the incision  until instructed by your surgeon. °

## 2017-11-16 NOTE — Transfer of Care (Signed)
Immediate Anesthesia Transfer of Care Note  Patient: Jonathan Ochoa  Procedure(s) Performed: LEFT TOTAL KNEE ARTHROPLASTY (Left Knee) CORTISONE  INJECTION RIGHT KNEE (Right Knee)  Patient Location: PACU  Anesthesia Type:Spinal  Level of Consciousness: awake, alert , oriented and patient cooperative  Airway & Oxygen Therapy: Patient Spontanous Breathing and Patient connected to face mask  Post-op Assessment: Report given to RN and Post -op Vital signs reviewed and stable  Post vital signs: Reviewed and stable  Last Vitals:  Vitals Value Taken Time  BP 107/65 11/16/2017 11:58 AM  Temp    Pulse 46 11/16/2017 12:00 PM  Resp 11 11/16/2017 12:00 PM  SpO2 100 % 11/16/2017 12:00 PM  Vitals shown include unvalidated device data.  Last Pain:  Vitals:   11/16/17 1158  TempSrc:   PainSc: (P) Asleep      Patients Stated Pain Goal: 3 (00/71/21 9758)  Complications: No apparent anesthesia complications

## 2017-11-16 NOTE — Anesthesia Postprocedure Evaluation (Signed)
Anesthesia Post Note  Patient: Jonathan Ochoa  Procedure(s) Performed: LEFT TOTAL KNEE ARTHROPLASTY (Left Knee) CORTISONE  INJECTION RIGHT KNEE (Right Knee)     Patient location during evaluation: PACU Anesthesia Type: Regional, MAC and Spinal Level of consciousness: awake and alert Pain management: pain level controlled Vital Signs Assessment: post-procedure vital signs reviewed and stable Respiratory status: spontaneous breathing, nonlabored ventilation, respiratory function stable and patient connected to nasal cannula oxygen Cardiovascular status: stable and blood pressure returned to baseline Postop Assessment: no apparent nausea or vomiting and spinal receding Anesthetic complications: no    Last Vitals:  Vitals:   11/16/17 1300 11/16/17 1400  BP: 114/68 119/73  Pulse: (!) 46 (!) 45  Resp: 16 15  Temp: 36.5 C 36.5 C  SpO2: 99% 100%    Last Pain:  Vitals:   11/16/17 1400  TempSrc: Oral  PainSc: 0-No pain                 Bristal Steffy

## 2017-11-16 NOTE — Evaluation (Signed)
Physical Therapy Evaluation Patient Details Name: Jonathan Ochoa MRN: [id number] DOB: 02/15/1951 Today's Date: 11/16/2017   History of Present Illness  L TKA  Clinical Impression  The patient had numbness remaining from spinal anesthesia.  Assisted to sitting on edge of bed only. Pt admitted with above diagnosis. Pt currently with functional limitations due to the deficits listed below (see PT Problem List).  Pt will benefit from skilled PT to increase their independence and safety with mobility to allow discharge to the venue listed below.       Follow Up Recommendations Follow surgeon's recommendation for DC plan and follow-up therapies    Equipment Recommendations  None recommended by PT    Recommendations for Other Services       Precautions / Restrictions Precautions Precautions: Knee;Fall Required Braces or Orthoses: Knee Immobilizer - Left Knee Immobilizer - Left: Discontinue once straight leg raise with < 10 degree lag      Mobility  Bed Mobility Overal bed mobility: Needs Assistance Bed Mobility: Supine to Sit;Sit to Supine     Supine to sit: Mod assist Sit to supine: Mod assist   General bed mobility comments: assist with legs, support at trunk  Transfers                 General transfer comment: NT due to numbness of legs  Ambulation/Gait                Stairs            Wheelchair Mobility    Modified [name] (Stroke Patients Only)       Balance                                             Pertinent Vitals/Pain Pain Assessment: No/denies pain    Home Living Family/patient expects to be discharged to:: Private residence Living Arrangements: Spouse/significant other Available Help at Discharge: Family Type of Home: House Home Access: Stairs to enter Entrance Stairs-Rails: Psychiatric nurse of Steps: 3 Home Layout: One level Home Equipment: Environmental consultant - 2 wheels      Prior Function  Level of Independence: Independent with assistive device(s)               Hand Dominance        Extremity/Trunk Assessment   Upper Extremity Assessment Upper Extremity Assessment: Overall WFL for tasks assessed    Lower Extremity Assessment Lower Extremity Assessment: LLE deficits/detail;RLE deficits/detail RLE Deficits / Details: reports a little numb, able to SLR LLE Deficits / Details: leg is numb, unable to  lift leg without significant lag,        Communication   Communication: No difficulties  Cognition Arousal/Alertness: Awake/alert Behavior During Therapy: WFL for tasks assessed/performed Overall Cognitive Status: Within Functional Limits for tasks assessed                                        General Comments      Exercises     Assessment/Plan    PT Assessment Patient needs continued PT services  PT Problem List Decreased strength;Decreased range of motion;Decreased activity tolerance;Decreased safety awareness;Decreased knowledge of precautions;Decreased mobility;Impaired sensation;Pain       PT Treatment Interventions DME instruction;Therapeutic exercise;Gait training;Stair training;Functional mobility training;Therapeutic activities;Patient/family education  PT Goals (Current goals can be found in the Care Plan section)  Acute Rehab PT Goals Patient Stated Goal: to walk without pain PT Goal Formulation: With patient/family Time For Goal Achievement: 11/21/17 Potential to Achieve Goals: Good    Frequency 7X/week   Barriers to discharge        Co-evaluation               AM-PAC PT "6 Clicks" Daily Activity  Outcome Measure Difficulty turning over in bed (including adjusting bedclothes, sheets and blankets)?: Unable Difficulty moving from lying on back to sitting on the side of the bed? : Unable Difficulty sitting down on and standing up from a chair with arms (e.g., wheelchair, bedside commode, etc,.)?:  Unable Help needed moving to and from a bed to chair (including a wheelchair)?: Total Help needed walking in hospital room?: Total Help needed climbing 3-5 steps with a railing? : Total 6 Click Score: 6    End of Session   Activity Tolerance: Patient tolerated treatment well;Treatment limited secondary to medical complications (Comment) Patient left: in bed;with call bell/phone within reach;with bed alarm set;with family/visitor present Nurse Communication: Mobility status PT Visit Diagnosis: Unsteadiness on feet (R26.81);Pain    Time: 0160-1093 PT Time Calculation (min) (ACUTE ONLY): 19 min   Charges:   PT Evaluation $PT Eval Low Complexity: 1 Low     PT G CodesTresa Endo PT [phone]  Claretha Cooper 11/16/2017, 5:05 PM

## 2017-11-17 LAB — BASIC METABOLIC PANEL
ANION GAP: 9 (ref 5–15)
BUN: 15 mg/dL (ref 6–20)
CALCIUM: 8.7 mg/dL — AB (ref 8.9–10.3)
CO2: 23 mmol/L (ref 22–32)
CREATININE: 0.79 mg/dL (ref 0.61–1.24)
Chloride: 107 mmol/L (ref 101–111)
Glucose, Bld: 148 mg/dL — ABNORMAL HIGH (ref 65–99)
Potassium: 4.2 mmol/L (ref 3.5–5.1)
Sodium: 139 mmol/L (ref 135–145)

## 2017-11-17 LAB — CBC
HCT: 34.1 % — ABNORMAL LOW (ref 39.0–52.0)
Hemoglobin: 11.2 g/dL — ABNORMAL LOW (ref 13.0–17.0)
MCH: 30.3 pg (ref 26.0–34.0)
MCHC: 32.8 g/dL (ref 30.0–36.0)
MCV: 92.2 fL (ref 78.0–100.0)
PLATELETS: 237 10*3/uL (ref 150–400)
RBC: 3.7 MIL/uL — ABNORMAL LOW (ref 4.22–5.81)
RDW: 12.6 % (ref 11.5–15.5)
WBC: 13.8 10*3/uL — AB (ref 4.0–10.5)

## 2017-11-17 NOTE — Progress Notes (Signed)
Spoke with patient and spouse at bedside. Confirmed plan for OP PT, already arranged. Has RW but needs a 3n1, contacted [facility] to deliver to the room. [phone]

## 2017-11-17 NOTE — Progress Notes (Signed)
Physical Therapy Treatment Patient Details Name: Jonathan Ochoa MRN: [id number] DOB: 12-23-1950 Today's Date: 11/17/2017    History of Present Illness L TKA, the quads are very weak on the left post op    PT Comments    The patient's left quads are very weak and do not support even with  KI in place. Left knee buckling  When ambulated with KI and therapist supporting at knee, being prepared for buckling based on pretest of strength prior to getting up. RN aware af no ambulation until strength improves.    Follow Up Recommendations  Follow surgeon's recommendation for DC plan and follow-up therapies     Equipment Recommendations  None recommended by PT    Recommendations for Other Services       Precautions / Restrictions Precautions Precautions: Knee;Fall Precaution Comments: the left quads remain significantly weak, able to initiate knee extension,  1/5 strength., unable to perform a SLR , Knee buckled significantly in KI while ambulating, even with the therapist supporting the knee . Chair was close behind and patient was assisted quickly to sit down. Required Braces or Orthoses: Knee Immobilizer - Left Knee Immobilizer - Left: Discontinue once straight leg raise with < 10 degree lag Restrictions Weight Bearing Restrictions: No    Mobility  Bed Mobility Overal bed mobility: Needs Assistance Bed Mobility: Supine to Sit     Supine to sit: Min assist     General bed mobility comments: assist with  left leg.   Transfers Overall transfer level: Needs assistance Equipment used: Rolling walker (2 wheeled) Transfers: Sit to/from Stand Sit to Stand: +2 physical assistance;+2 safety/equipment;From elevated surface         General transfer comment: cues to hold steady onto RW. Noted theat the left knee buckled upon standing with the KI in place.  Ambulation/Gait Ambulation/Gait assistance: Mod assist;+2 physical assistance;+2 safety/equipment Ambulation Distance  (Feet): 30 Feet Assistive device: Rolling walker (2 wheeled) Gait Pattern/deviations: Step-to pattern;Decreased stance time - left     General Gait Details: Left knee buckling inside th KI and with therapist providing external support at the knee. Patient buckled to point that therapist assisted patient to sit down to recliner quickly.   Stairs             Wheelchair Mobility    Modified [name] (Stroke Patients Only)       Balance                                            Cognition Arousal/Alertness: Awake/alert Behavior During Therapy: WFL for tasks assessed/performed Overall Cognitive Status: Within Functional Limits for tasks assessed                                        Exercises Total Joint Exercises Ankle Circles/Pumps: AROM;10 reps;Both Quad Sets: AAROM;10 reps;Both Short Arc Quad: AAROM;Left;10 reps Heel Slides: AAROM;Left;10 reps Hip ABduction/ADduction: AAROM;Left;10 reps Straight Leg Raises: AAROM;Left;10 reps Goniometric ROM: 10-70 knee flexion on left    General Comments        Pertinent Vitals/Pain Pain Assessment: 0-10 Pain Score: 3  Pain Location: left knee Pain Descriptors / Indicators: Discomfort Pain Intervention(s): Monitored during session;Premedicated before session;Ice applied    Home Living  Prior Function            PT Goals (current goals can now be found in the care plan section) Progress towards PT goals: Progressing toward goals    Frequency    7X/week      PT Plan Current plan remains appropriate    Co-evaluation              AM-PAC PT "6 Clicks" Daily Activity  Outcome Measure  Difficulty turning over in bed (including adjusting bedclothes, sheets and blankets)?: A Little Difficulty moving from lying on back to sitting on the side of the bed? : Unable Difficulty sitting down on and standing up from a chair with arms (e.g., wheelchair,  bedside commode, etc,.)?: Unable Help needed moving to and from a bed to chair (including a wheelchair)?: Total Help needed walking in hospital room?: Total Help needed climbing 3-5 steps with a railing? : Total 6 Click Score: 8    End of Session Equipment Utilized During Treatment: Gait belt;Left knee immobilizer Activity Tolerance: Other (comment)(decreased left quad strength with buckling  with KI.) Patient left: in chair;with call bell/phone within reach;with chair alarm set;with family/visitor present Nurse Communication: Mobility status(significant weak quads and not to ambulate.) PT Visit Diagnosis: Unsteadiness on feet (R26.81);Pain Pain - part of body: Knee     Time: 1275-1700 PT Time Calculation (min) (ACUTE ONLY): 39 min  Charges:  $Gait Training: 23-37 mins $Therapeutic Exercise: 8-22 mins                    G CodesTresa Endo PT [phone]     Claretha Cooper 11/17/2017, 12:46 PM

## 2017-11-17 NOTE — Progress Notes (Signed)
At 1155 I was notified by Tresa Endo, PT that the pt was experiencing numbness in their left quad, unable to fully straighten it, and the pt's knee buckled during PT with a knee immobilizer on.  At 1200 I notified Ardeen Jourdain, [place] regarding the pt's status. Amber requested an update following the pt's second session of PT today.   As of 1514, I was notified by Tresa Endo, PT that the pt was still experiencing numbness and recommended bedrest today along with no use of CPM until indicated otherwise by Dr. Wynelle Link.  Ardeen Jourdain, [place] was notified regarding no change in quad numbness and agreed with PT's recommendation of bedrest and no CPM use for tonight.   Will continue to monitor/assess pt.

## 2017-11-17 NOTE — Progress Notes (Signed)
Physical Therapy Treatment Patient Details Name: Jonathan Ochoa MRN: [id number] DOB: [birth year] Today's Date: 11/17/2017    History of Present Illness L TKA, the quads are very weak on the left post op    PT Comments    The patient  Continues to have left quad weakness which appears isolated. Noted hip flexion, ankle dorsi/plantar flexion and hamstring strength appears WFL. The  Patient appreciates  LT  Throughout the left thigh and leg, no noted numbness is noted. Attempted to palpate the left quad tendon activity under the dressing  during knee extension efforts but not able to get a truly good palpation with dressing in place. Left knee buckled with KI  And external support  During  Small pivot steps back to the bed using RW.   RN notified  And MD is aware .   Follow Up Recommendations  Follow surgeon's recommendation for DC plan and follow-up therapies     Equipment Recommendations  None recommended by PT    Recommendations for Other Services       Precautions / Restrictions Precautions Precautions: Knee;Fall Precaution Comments: the left quads remain significantly weak,  able to appreciate LTof the  quad area, able to initiate knee extension with noted fasciculations of the quad,  1/5 strength., , Knee buckled significantly in KI while standing even with the therapist supporting the knee .  Required Braces or Orthoses: Knee Immobilizer - Left Knee Immobilizer - Left: Discontinue once straight leg raise with < 10 degree lag    Mobility  Bed Mobility Overal bed mobility: Needs Assistance Bed Mobility: Sit to Supine     Supine to sit: Min assist Sit to supine: Mod assist   General bed mobility comments: assist with left leg  Transfers Overall transfer level: Needs assistance Equipment used: Rolling walker (2 wheeled) Transfers: Sit to/from Stand;Stand Pivot Transfers Sit to Stand: +2 safety/equipment;Min assist Stand pivot transfers: +2 safety/equipment;+2 physical  assistance;Mod assist       General transfer comment: cues for hand placement to  stand from recliner, noted Left knee still buckling. Assisted with small steps back to the bed with noted left knee buckling as approached the bed  after only  3 small steps.   Ambulation/Gait Unable at this time due to safety concerns for buckling Of left knee      Stairs             Wheelchair Mobility    Modified [name] (Stroke Patients Only)       Balance Overall balance assessment: Needs assistance         Standing balance support: During functional activity;Bilateral upper extremity supported Standing balance-Leahy Scale: Poor Standing balance comment: left quad weakness                            Cognition Arousal/Alertness: Awake/alert Behavior During Therapy: WFL for tasks assessed/performed Overall Cognitive Status: Within Functional Limits for tasks assessed                                        Exercises Total Joint Exercises Ankle Circles/Pumps: AROM;10 reps;Both Quad Sets: AAROM;10 reps;Both Short Arc Quad: AAROM;Left;10 reps Heel Slides: AAROM;Left;10 reps Hip ABduction/ADduction: AAROM;Left;10 reps Straight Leg Raises: AAROM;Left;10 reps Goniometric ROM: 10-70 knee flexion on left    General Comments        Pertinent Vitals/Pain  Pain Assessment: 0-10 Pain Score: 1  Pain Location: left knee Pain Descriptors / Indicators: Discomfort Pain Intervention(s): Monitored during session;Ice applied    Home Living                      Prior Function            PT Goals (current goals can now be found in the care plan section) Progress towards PT goals: Not progressing toward goals - comment(left quadricep weakness)    Frequency    7X/week      PT Plan Current plan remains appropriate    Co-evaluation              AM-PAC PT "6 Clicks" Daily Activity  Outcome Measure  Difficulty turning over in bed  (including adjusting bedclothes, sheets and blankets)?: A Lot Difficulty moving from lying on back to sitting on the side of the bed? : Unable Difficulty sitting down on and standing up from a chair with arms (e.g., wheelchair, bedside commode, etc,.)?: Unable Help needed moving to and from a bed to chair (including a wheelchair)?: Total Help needed walking in hospital room?: Total Help needed climbing 3-5 steps with a railing? : Total 6 Click Score: 7    End of Session Equipment Utilized During Treatment: Gait belt;Left knee immobilizer Activity Tolerance: Other (comment) Patient left: in bed;with call bell/phone within reach;with bed alarm set;with family/visitor present Nurse Communication: Mobility status(quad weakness remains, decreased ability to stand) PT Visit Diagnosis: Unsteadiness on feet (R26.81);Pain Pain - Right/Left: Left Pain - part of body: Knee     Time: 3716-9678 PT Time Calculation (min) (ACUTE ONLY): 27 min  Charges:  $Therapeutic Exercise: 8-22 mins $Therapeutic Activity: 8-22 mins                    G CodesTresa Endo PT [phone]   Claretha Cooper 11/17/2017, 3:40 PM

## 2017-11-17 NOTE — Progress Notes (Signed)
OT Cancellation Note  Patient Details Name: Jonathan Ochoa MRN: [id number] DOB: April 06, 1951   Cancelled Treatment:    Reason Eval/Treat Not Completed: Other (comment).  Pt not ready for OT today; buckled with PT. Will check on him tomorrow am.  Bastion Bolger 11/17/2017, 12:34 PM  Lesle Chris, OTR/L [phone] 11/17/2017

## 2017-11-17 NOTE — Progress Notes (Signed)
   Subjective: 1 Day Post-Op Procedure(s) (LRB): LEFT TOTAL KNEE ARTHROPLASTY (Left) CORTISONE  INJECTION RIGHT KNEE (Right) Patient reports pain as moderate.   Patient seen in rounds with Dr. Wynelle Link. Patient is well, and has had no acute complaints or problems We will start walking with therapy today. Voiding without difficulty. Plan is to go Home after hospital stay.  Objective: Vital signs in last 24 hours: Temp:  [97.3 F (36.3 C)-98.8 F (37.1 C)] 97.7 F (36.5 C) (05/14 0612) Pulse Rate:  [42-64] 49 (05/14 0612) Resp:  [9-17] 16 (05/13 2300) BP: (106-129)/(57-73) 115/63 (05/14 0612) SpO2:  [92 %-100 %] 99 % (05/14 0612) Weight:  [94.3 kg (208 lb)-94.6 kg (208 lb 9.6 oz)] 94.3 kg (208 lb) (05/13 1300)  Intake/Output from previous day:  Intake/Output Summary (Last 24 hours) at 11/17/2017 0740 Last data filed at 11/17/2017 0616 Gross per 24 hour  Intake 3020.4 ml  Output 4755 ml  Net -1734.6 ml   Labs: Recent Labs    11/17/17 0514  HGB 11.2*   Recent Labs    11/17/17 0514  WBC 13.8*  RBC 3.70*  HCT 34.1*  PLT 237   Recent Labs    11/17/17 0514  NA 139  K 4.2  CL 107  CO2 23  BUN 15  CREATININE 0.79  GLUCOSE 148*  CALCIUM 8.7*   EXAM General - Patient is Alert, Appropriate and Oriented Extremity - Neurovascular intact Sensation intact distally Intact pulses distally Dorsiflexion/Plantar flexion intact Dressing - dressing C/D/I Motor Function - intact, moving foot and toes well on exam.  Hemovac pulled without difficulty.  Past Medical History:  Diagnosis Date  . Arthritis   . Hypothyroidism   . Thyroid disease     Assessment/Plan: 1 Day Post-Op Procedure(s) (LRB): LEFT TOTAL KNEE ARTHROPLASTY (Left) CORTISONE  INJECTION RIGHT KNEE (Right) Principal Problem:   OA (osteoarthritis) of knee  Estimated body mass index is 29.01 kg/m as calculated from the following:   Height as of this encounter: 5\' 11"  (1.803 m).   Weight as of this  encounter: 94.3 kg (208 lb). Advance diet Up with therapy   Anticipated LOS equal to or greater than 2 midnights due to - Age 67 and older with one or more of the following:  - Obesity  - Expected need for hospital services (PT, OT, Nursing) required for safe  discharge  - Anticipated need for postoperative skilled nursing care or inpatient rehab  - Active co-morbidities: None OR   - Unanticipated findings during/Post Surgery: None  - Patient is a high risk of re-admission due to: None    DVT Prophylaxis - Aspirin Weight-Bearing as tolerated D/C O2 and Pulse OX and try on Room Air  Up with therapy today, possible discharge tomorrow if meeting goals.  Ardeen Jourdain, PA-C Orthopaedic Surgery 11/17/2017, 7:40 AM

## 2017-11-18 LAB — CBC
HCT: 33.9 % — ABNORMAL LOW (ref 39.0–52.0)
Hemoglobin: 11.2 g/dL — ABNORMAL LOW (ref 13.0–17.0)
MCH: 30.7 pg (ref 26.0–34.0)
MCHC: 33 g/dL (ref 30.0–36.0)
MCV: 92.9 fL (ref 78.0–100.0)
PLATELETS: 221 10*3/uL (ref 150–400)
RBC: 3.65 MIL/uL — ABNORMAL LOW (ref 4.22–5.81)
RDW: 12.7 % (ref 11.5–15.5)
WBC: 13.8 10*3/uL — AB (ref 4.0–10.5)

## 2017-11-18 LAB — BASIC METABOLIC PANEL
Anion gap: 9 (ref 5–15)
BUN: 18 mg/dL (ref 6–20)
CALCIUM: 8.8 mg/dL — AB (ref 8.9–10.3)
CO2: 25 mmol/L (ref 22–32)
CREATININE: 0.72 mg/dL (ref 0.61–1.24)
Chloride: 104 mmol/L (ref 101–111)
GFR calc non Af Amer: >60 mL/min (ref >60)
Glucose, Bld: 119 mg/dL — ABNORMAL HIGH (ref 65–99)
Potassium: 4.2 mmol/L (ref 3.5–5.1)
SODIUM: 138 mmol/L (ref 135–145)

## 2017-11-18 NOTE — Discharge Summary (Signed)
Physician Discharge Summary   Patient ID: Jonathan Ochoa MRN: [id number] DOB/AGE: 67-Jan-1952 67 y.o.  Admit date: 11/16/2017 Discharge date: 11/18/2017  Primary Diagnosis: Primary osteoarthritis left knee  Admission Diagnoses:  Past Medical History:  Diagnosis Date  . Arthritis   . Hypothyroidism   . Thyroid disease    Discharge Diagnoses:   Principal Problem:   OA (osteoarthritis) of knee  Estimated body mass index is 29.01 kg/m as calculated from the following:   Height as of this encounter: '5\' 11"'$  (1.803 m).   Weight as of this encounter: 94.3 kg (208 lb).  Procedure:  Procedure(s) (LRB): LEFT TOTAL KNEE ARTHROPLASTY (Left) CORTISONE  INJECTION RIGHT KNEE (Right)   Consults: None  HPI: Jonathan Ochoa is a pleasant 67 year old male who has been seen in the clinic for follow-up of bilateral knee osteoarthritis. He has occasional aching and weakness in the knees. He received cortisone injections, which were helpful for only about 6 weeks. He has contractures of both knees. Standard two view radiographs of his bilateral knees demonstrated bone-on-bone arthritis medial and lateral and patellofemoral. Patient has significant pain and dysfunction in their knee. They have had injections and failed nonoperative management. At this point the pain and dysfunction have essentially taken over their life. The most predictable means of improving pain and function is total knee arthroplasty.  We discussed the procedure risks, potential complications and rehab course in detail and the patient would like to go ahead and proceed with total knee arthroplasty.      Laboratory Data: Admission on 11/16/2017, Discharged on 11/18/2017  Component Date Value Ref Range Status  . WBC 11/17/2017 13.8* 4.0 - 10.5 K/uL Final  . RBC 11/17/2017 3.70* 4.22 - 5.81 MIL/uL Final  . Hemoglobin 11/17/2017 11.2* 13.0 - 17.0 g/dL Final  . HCT 11/17/2017 34.1* 39.0 - 52.0 % Final  . MCV 11/17/2017 92.2   78.0 - 100.0 fL Final  . MCH 11/17/2017 30.3  26.0 - 34.0 pg Final  . MCHC 11/17/2017 32.8  30.0 - 36.0 g/dL Final  . RDW 11/17/2017 12.6  11.5 - 15.5 % Final  . Platelets 11/17/2017 237  150 - 400 K/uL Final   Performed at [facility], [place] [address] [place], [place] [address]  . Sodium 11/17/2017 139  135 - 145 mmol/L Final  . Potassium 11/17/2017 4.2  3.5 - 5.1 mmol/L Final  . Chloride 11/17/2017 107  101 - 111 mmol/L Final  . CO2 11/17/2017 23  22 - 32 mmol/L Final  . Glucose, Bld 11/17/2017 148* 65 - 99 mg/dL Final  . BUN 11/17/2017 15  6 - 20 mg/dL Final  . Creatinine, Ser 11/17/2017 0.79  0.61 - 1.24 mg/dL Final  . Calcium 11/17/2017 8.7* 8.9 - 10.3 mg/dL Final  . GFR calc non Af Amer 11/17/2017 >60  >60 mL/min Final  . GFR calc Af Amer 11/17/2017 >60  >60 mL/min Final   Comment: (NOTE) The eGFR has been calculated using the CKD EPI equation. This calculation has not been validated in all clinical situations. eGFR's persistently <60 mL/min signify possible Chronic Kidney Disease.   Georgiann Hahn gap 11/17/2017 9  5 - 15 Final   Performed at [facility], [place] [address] [place], [place] [address]  . WBC 11/18/2017 13.8* 4.0 - 10.5 K/uL Final  . RBC 11/18/2017 3.65* 4.22 - 5.81 MIL/uL Final  . Hemoglobin 11/18/2017 11.2* 13.0 - 17.0 g/dL Final  . HCT 11/18/2017 33.9* 39.0 - 52.0 %  Final  . MCV 11/18/2017 92.9  78.0 - 100.0 fL Final  . MCH 11/18/2017 30.7  26.0 - 34.0 pg Final  . MCHC 11/18/2017 33.0  30.0 - 36.0 g/dL Final  . RDW 11/18/2017 12.7  11.5 - 15.5 % Final  . Platelets 11/18/2017 221  150 - 400 K/uL Final   Performed at [facility], [place] [address], [place], [place] [address]  . Sodium 11/18/2017 138  135 - 145 mmol/L Final  . Potassium 11/18/2017 4.2  3.5 - 5.1 mmol/L Final  . Chloride 11/18/2017 104  101 - 111 mmol/L Final  . CO2 11/18/2017 25  22 - 32 mmol/L Final  . Glucose, Bld 11/18/2017 119* 65 - 99  mg/dL Final  . BUN 11/18/2017 18  6 - 20 mg/dL Final  . Creatinine, Ser 11/18/2017 0.72  0.61 - 1.24 mg/dL Final  . Calcium 11/18/2017 8.8* 8.9 - 10.3 mg/dL Final  . GFR calc non Af Amer 11/18/2017 >60  >60 mL/min Final  . GFR calc Af Amer 11/18/2017 >60  >60 mL/min Final   Comment: (NOTE) The eGFR has been calculated using the CKD EPI equation. This calculation has not been validated in all clinical situations. eGFR's persistently <60 mL/min signify possible Chronic Kidney Disease.   Georgiann Hahn gap 11/18/2017 9  5 - 15 Final   Performed at [facility], [place] [address], [place], [place] [address]  Hospital Outpatient Visit on 11/05/2017  Component Date Value Ref Range Status  . MRSA, PCR 11/05/2017 NEGATIVE  NEGATIVE Final  . Staphylococcus aureus 11/05/2017 NEGATIVE  NEGATIVE Final   Comment: (NOTE) The Xpert SA Assay (FDA approved for NASAL specimens in patients 36 years of age and older), is one component of a comprehensive surveillance program. It is not intended to diagnose infection nor to guide or monitor treatment. Performed at [facility], [place] [address] [place], [place] [address]   . aPTT 11/05/2017 26  24 - 36 seconds Final   Performed at [facility], [place] [address], [place], [place] [address]  . WBC 11/05/2017 5.6  4.0 - 10.5 K/uL Final  . RBC 11/05/2017 4.16* 4.22 - 5.81 MIL/uL Final  . Hemoglobin 11/05/2017 12.8* 13.0 - 17.0 g/dL Final  . HCT 11/05/2017 38.5* 39.0 - 52.0 % Final  . MCV 11/05/2017 92.5  78.0 - 100.0 fL Final  . MCH 11/05/2017 30.8  26.0 - 34.0 pg Final  . MCHC 11/05/2017 33.2  30.0 - 36.0 g/dL Final  . RDW 11/05/2017 12.4  11.5 - 15.5 % Final  . Platelets 11/05/2017 219  150 - 400 K/uL Final   Performed at [facility], [place] [address] [place],  [address]  . Sodium 11/05/2017 139  135 - 145 mmol/L Final  . Potassium 11/05/2017 4.6  3.5 - 5.1 mmol/L Final  . Chloride  11/05/2017 108  101 - 111 mmol/L Final  . CO2 11/05/2017 23  22 - 32 mmol/L Final  . Glucose, Bld 11/05/2017 102* 65 - 99 mg/dL Final  . BUN 11/05/2017 24* 6 - 20 mg/dL Final  . Creatinine, Ser 11/05/2017 0.81  0.61 - 1.24 mg/dL Final  . Calcium 11/05/2017 9.3  8.9 - 10.3 mg/dL Final  . Total Protein 11/05/2017 7.2  6.5 - 8.1 g/dL Final  . Albumin 11/05/2017 3.8  3.5 - 5.0 g/dL Final  . AST 11/05/2017 38  15 - 41 U/L Final  . ALT 11/05/2017 23  17 -  63 U/L Final  . Alkaline Phosphatase 11/05/2017 80  38 - 126 U/L Final  . Total Bilirubin 11/05/2017 0.8  0.3 - 1.2 mg/dL Final  . GFR calc non Af Amer 11/05/2017 >60  >60 mL/min Final  . GFR calc Af Amer 11/05/2017 >60  >60 mL/min Final   Comment: (NOTE) The eGFR has been calculated using the CKD EPI equation. This calculation has not been validated in all clinical situations. eGFR's persistently <60 mL/min signify possible Chronic Kidney Disease.   Georgiann Hahn gap 11/05/2017 8  5 - 15 Final   Performed at [facility], [place] [address], [place], [place] [address]  . Prothrombin Time 11/05/2017 13.8  11.4 - 15.2 seconds Final  . INR 11/05/2017 1.07   Final   Performed at [facility], [place] [address], [place], [place] [address]  . ABO/RH(D) 11/05/2017 A POS   Final  . Antibody Screen 11/05/2017 NEG   Final  . Sample Expiration 11/05/2017 11/19/2017   Final  . Extend sample reason 11/05/2017    Final                   Value:NO TRANSFUSIONS OR PREGNANCY IN THE PAST 3 MONTHS Performed at [facility], [place] [address], [place], [place] [address]   . ABO/RH(D) 11/05/2017    Final                   Value:A POS Performed at [facility], [place] [address], [place], [place] [address]      Hospital Course: RONIT MARCZAK is a 67 y.o. who was admitted to [facility]. They were brought to the operating room on 11/16/2017 and underwent Procedure(s): LEFT TOTAL KNEE  ARTHROPLASTY CORTISONE  INJECTION RIGHT KNEE.  Patient tolerated the procedure well and was later transferred to the recovery room and then to the orthopaedic floor for postoperative care.  They were given PO and IV analgesics for pain control following their surgery.  They were given 24 hours of postoperative antibiotics of  Anti-infectives (From admission, onward)   Start     Dose/Rate Route Frequency Ordered Stop   11/16/17 1645  ceFAZolin (ANCEF) IVPB 2g/100 mL premix     2 g 200 mL/hr over 30 Minutes Intravenous Every 6 hours 11/16/17 1309 11/17/17 0018   11/16/17 0815  ceFAZolin (ANCEF) IVPB 2g/100 mL premix     2 g 200 mL/hr over 30 Minutes Intravenous On call to O.R. 11/16/17 0801 11/16/17 1033   11/16/17 0805  ceFAZolin (ANCEF) 2-4 GM/100ML-% IVPB    Note to Pharmacy:  Mardelle Matte   : cabinet override      11/16/17 0805 11/16/17 1033     and started on DVT prophylaxis in the form of Aspirin.   PT and OT were ordered for total joint protocol.  Discharge planning consulted to help with postop disposition and equipment needs.  Patient had a good night on the evening of surgery.  They started to get up OOB with therapy on day one but progressed slowly due to quad weakness. Hemovac drain was pulled without difficulty.  Continued to work with therapy into day two and did see some improvement after two sessions.  Dressing was changed on day two and the incision was clean and dry.  The patient had progressed with therapy and meeting their goals.  Incision was healing well.  Patient was seen in rounds and was ready to go home.  Diet: Cardiac diet Activity:WBAT Follow-up:in 2 weeks Disposition - Home Discharged Condition: stable   Discharge Instructions    Call MD / Call 911   Complete by:  As directed    If you experience chest pain or shortness of breath, CALL 911 and be transported to the hospital emergency room.  If you develope a fever above 101 F, pus (white drainage) or  increased drainage or redness at the wound, or calf pain, call your surgeon's office.   Constipation Prevention   Complete by:  As directed    Drink plenty of fluids.  Prune juice may be helpful.  You may use a stool softener, such as Colace (over the counter) 100 mg twice a day.  Use MiraLax (over the counter) for constipation as needed.   Diet - low sodium heart healthy   Complete by:  As directed    Discharge instructions   Complete by:  As directed    .   Dr. [name] Total Joint Specialist [facility] [address], [place], [place] [address] [phone]  TOTAL KNEE REPLACEMENT POSTOPERATIVE DIRECTIONS  Knee Rehabilitation, Guidelines Following Surgery  Results after knee surgery are often greatly improved when you follow the exercise, range of motion and muscle strengthening exercises prescribed by your doctor. Safety measures are also important to protect the knee from further injury. Any time any of these exercises cause you to have increased pain or swelling in your knee joint, decrease the amount until you are comfortable again and slowly increase them. If you have problems or questions, call your caregiver or physical therapist for advice.   HOME CARE INSTRUCTIONS  Remove items at home which could result in a fall. This includes throw rugs or furniture in walking pathways.  ICE to the affected knee every three hours for 30 minutes at a time and then as needed for pain and swelling.  Continue to use ice on the knee for pain and swelling from surgery. You may notice swelling that will progress down to the foot and ankle.  This is normal after surgery.  Elevate the leg when you are not up walking on it.   Continue to use the breathing machine which will help keep your temperature down.  It is common for your temperature to cycle up and down following surgery, especially at night when you are not up moving around and exerting yourself.  The breathing machine keeps  your lungs expanded and your temperature down. Do not place pillow under knee, focus on keeping the knee straight while resting  DIET You may resume your previous home diet once your are discharged from the hospital.  DRESSING / WOUND CARE / SHOWERING You may change your dressing every day with sterile gauze.  Please use good hand washing techniques before changing the dressing.  Do not use any lotions or creams on the incision until instructed by your surgeon. You may start showering once you are discharged home but do not submerge the incision under water. Just pat the incision dry and apply a dry gauze dressing on daily. Change the surgical dressing daily and reapply a dry dressing each time.  ACTIVITY Walk with your walker as instructed. Use walker as long as suggested by your caregivers. Avoid periods of inactivity such as sitting longer than an hour when not asleep. This helps prevent blood clots.  You may resume a sexual relationship in one month or when given the OK by your doctor.  You may return  to work once you are cleared by your doctor.  Do not drive a car for 6 weeks or until released by you surgeon.  Do not drive while taking narcotics.  WEIGHT BEARING Weight bearing as tolerated with assist device (walker, cane, etc) as directed, use it as long as suggested by your surgeon or therapist, typically at least 4-6 weeks.  POSTOPERATIVE CONSTIPATION PROTOCOL Constipation - defined medically as fewer than three stools per week and severe constipation as less than one stool per week.  One of the most common issues patients have following surgery is constipation.  Even if you have a regular bowel pattern at home, your normal regimen is likely to be disrupted due to multiple reasons following surgery.  Combination of anesthesia, postoperative narcotics, change in appetite and fluid intake all can affect your bowels.  In order to avoid complications following surgery, here are some  recommendations in order to help you during your recovery period.  Colace (docusate) - Pick up an over-the-counter form of Colace or another stool softener and take twice a day as long as you are requiring postoperative pain medications.  Take with a full glass of water daily.  If you experience loose stools or diarrhea, hold the colace until you stool forms back up.  If your symptoms do not get better within 1 week or if they get worse, check with your doctor.  Dulcolax (bisacodyl) - Pick up over-the-counter and take as directed by the product packaging as needed to assist with the movement of your bowels.  Take with a full glass of water.  Use this product as needed if not relieved by Colace only.   MiraLax (polyethylene glycol) - Pick up over-the-counter to have on hand.  MiraLax is a solution that will increase the amount of water in your bowels to assist with bowel movements.  Take as directed and can mix with a glass of water, juice, soda, coffee, or tea.  Take if you go more than two days without a movement. Do not use MiraLax more than once per day. Call your doctor if you are still constipated or irregular after using this medication for 7 days in a row.  If you continue to have problems with postoperative constipation, please contact the office for further assistance and recommendations.  If you experience "the worst abdominal pain ever" or develop nausea or vomiting, please contact the office immediatly for further recommendations for treatment.  ITCHING  If you experience itching with your medications, try taking only a single pain pill, or even half a pain pill at a time.  You can also use Benadryl over the counter for itching or also to help with sleep.   TED HOSE STOCKINGS Wear the elastic stockings on both legs for three weeks following surgery during the day but you may remove then at night for sleeping.  MEDICATIONS See your medication summary on the "After Visit Summary" that the  nursing staff will review with you prior to discharge.  You may have some home medications which will be placed on hold until you complete the course of blood thinner medication.  It is important for you to complete the blood thinner medication as prescribed by your surgeon.  Continue your approved medications as instructed at time of discharge.  PRECAUTIONS If you experience chest pain or shortness of breath - call 911 immediately for transfer to the hospital emergency department.  If you develop a fever greater that 101 F, purulent drainage from wound, increased  redness or drainage from wound, foul odor from the wound/dressing, or calf pain - CONTACT YOUR SURGEON.                                                   FOLLOW-UP APPOINTMENTS Make sure you keep all of your appointments after your operation with your surgeon and caregivers. You should call the office at the above phone number and make an appointment for approximately two weeks after the date of your surgery or on the date instructed by your surgeon outlined in the "After Visit Summary".   RANGE OF MOTION AND STRENGTHENING EXERCISES  Rehabilitation of the knee is important following a knee injury or an operation. After just a few days of immobilization, the muscles of the thigh which control the knee become weakened and shrink (atrophy). Knee exercises are designed to build up the tone and strength of the thigh muscles and to improve knee motion. Often times heat used for twenty to thirty minutes before working out will loosen up your tissues and help with improving the range of motion but do not use heat for the first two weeks following surgery. These exercises can be done on a training (exercise) mat, on the floor, on a table or on a bed. Use what ever works the best and is most comfortable for you Knee exercises include:  Leg Lifts - While your knee is still immobilized in a splint or cast, you can do straight leg raises. Lift the leg to 60  degrees, hold for 3 sec, and slowly lower the leg. Repeat 10-20 times 2-3 times daily. Perform this exercise against resistance later as your knee gets better.  Quad and Hamstring Sets - Tighten up the muscle on the front of the thigh (Quad) and hold for 5-10 sec. Repeat this 10-20 times hourly. Hamstring sets are done by pushing the foot backward against an object and holding for 5-10 sec. Repeat as with quad sets.  Leg Slides: Lying on your back, slowly slide your foot toward your buttocks, bending your knee up off the floor (only go as far as is comfortable). Then slowly slide your foot back down until your leg is flat on the floor again. Angel Wings: Lying on your back spread your legs to the side as far apart as you can without causing discomfort.  A rehabilitation program following serious knee injuries can speed recovery and prevent re-injury in the future due to weakened muscles. Contact your doctor or a physical therapist for more information on knee rehabilitation.   IF YOU ARE TRANSFERRED TO A SKILLED REHAB FACILITY If the patient is transferred to a skilled rehab facility following release from the hospital, a list of the current medications will be sent to the facility for the patient to continue.  When discharged from the skilled rehab facility, please have the facility set up the patient's [place] prior to being released. Also, the skilled facility will be responsible for providing the patient with their medications at time of release from the facility to include their pain medication, the muscle relaxants, and their blood thinner medication. If the patient is still at the rehab facility at time of the two week follow up appointment, the skilled rehab facility will also need to assist the patient in arranging follow up appointment in our office and any  transportation needs.  MAKE SURE YOU:  Understand these instructions.  Get help right away if you are not doing well or  get worse.    Pick up stool softner and laxative for home use following surgery while on pain medications. Do not submerge incision under water. Please use good hand washing techniques while changing dressing each day. May shower starting three days after surgery. Please use a clean towel to pat the incision dry following showers. Continue to use ice for pain and swelling after surgery. Do not use any lotions or creams on the incision until instructed by your surgeon.   Increase activity slowly as tolerated   Complete by:  As directed      Allergies as of 11/18/2017   No Known Allergies     Medication List    STOP taking these medications   fluticasone 50 MCG/ACT nasal spray Commonly known as:  FLONASE   ipratropium 0.06 % nasal spray Commonly known as:  ATROVENT     TAKE these medications   aspirin 325 MG EC tablet Take 1 tablet (325 mg total) by mouth 2 (two) times daily.   Fish Oil 1000 MG Caps Take 1 capsule by mouth 2 (two) times a week.   GLUCOSAMINE CHOND MSM FORMULA PO Take 2 tablets by mouth every morning.   levothyroxine 137 MCG tablet Commonly known as:  SYNTHROID, LEVOTHROID Take 137 mcg by mouth daily before breakfast.   methocarbamol 500 MG tablet Commonly known as:  ROBAXIN Take 1 tablet (500 mg total) by mouth every 6 (six) hours as needed for muscle spasms.   multivitamin with minerals tablet Take 1 tablet by mouth daily.   naproxen sodium 220 MG tablet Commonly known as:  ALEVE Take 440 mg by mouth daily as needed.   oxyCODONE 5 MG immediate release tablet Commonly known as:  Oxy IR/ROXICODONE Take 1-2 tablets (5-10 mg total) by mouth every 6 (six) hours as needed for moderate pain.   saw palmetto 160 MG capsule Take 480 mg by mouth daily. 3 caps daily   traMADol 50 MG tablet Commonly known as:  ULTRAM Take 1-2 tablets (50-100 mg total) by mouth every 6 (six) hours as needed for moderate pain (if unresponsive to oxycodone).       Follow-up Information    [name], MD. Schedule an appointment as soon as possible for a visit on 12/01/2017.   Specialty:  Orthopedic Surgery Contact information: [address] [place] [address] [phone]           Signed: Ardeen Jourdain, PA-C Orthopaedic Surgery 11/18/2017, 9:48 PM

## 2017-11-18 NOTE — Progress Notes (Signed)
Physical Therapy Treatment Patient Details Name: Jonathan Ochoa MRN: [id number] DOB: [birth year] Today's Date: 11/18/2017    History of Present Illness Jonathan Ochoa, the quads are very weak on the left post op but with marked improvement POD 2`    PT Comments    Pt progressing slowly but steadily with mobility - ltd by marked fatigue with activity.  Reviewed stairs and home therex with written instruction provided.   Follow Up Recommendations  Follow surgeon's recommendation for DC plan and follow-up therapies     Equipment Recommendations  None recommended by PT    Recommendations for Other Services OT consult     Precautions / Restrictions Precautions Precautions: Knee;Fall Required Braces or Orthoses: Knee Immobilizer - Left Knee Immobilizer - Left: Discontinue once straight leg raise with < 10 degree lag Restrictions Weight Bearing Restrictions: No Other Position/Activity Restrictions: WBAT    Mobility  Bed Mobility Overal bed mobility: Needs Assistance Bed Mobility: Supine to Sit;Sit to Supine     Supine to sit: Min guard Sit to supine: Min guard   General bed mobility comments: for LLE  Transfers Overall transfer level: Needs assistance Equipment used: Rolling walker (2 wheeled) Transfers: Sit to/from Stand Sit to Stand: Min assist         General transfer comment: Cues for LE management and use of UEs to self assist  Ambulation/Gait Ambulation/Gait assistance: Min assist Ambulation Distance (Feet): 55 Feet Assistive device: Rolling walker (2 wheeled) Gait Pattern/deviations: Step-to pattern;Decreased stance time - left Gait velocity: decr   General Gait Details: Cues for sequence, posture and position from RW.  Marked improvement in stability of Jonathan knee   Stairs Stairs: Yes Stairs assistance: Min assist;Mod assist Stair Management: One rail Right;Step to pattern;Forwards;With crutches Number of Stairs: 2 General stair comments: cues for sequence  and foot/crutch placement.  Second attempt deferred 2* pt fatiigue   Engineer, building services [name] (Stroke Patients Only)       Balance Overall balance assessment: Needs assistance Sitting-balance support: No upper extremity supported;Feet supported Sitting balance-Leahy Scale: Good     Standing balance support: Bilateral upper extremity supported Standing balance-Leahy Scale: Poor                              Cognition Arousal/Alertness: Awake/alert Behavior During Therapy: WFL for tasks assessed/performed Overall Cognitive Status: Within Functional Limits for tasks assessed                                        Exercises Total Joint Exercises Ankle Circles/Pumps: AROM;Both;20 reps;Supine Quad Sets: 10 reps;Both;AROM;Supine Heel Slides: AAROM;Left;15 reps;Supine Straight Leg Raises: AAROM;Left;15 reps;Supine    General Comments        Pertinent Vitals/Pain Pain Assessment: 0-10 Pain Score: 5  Pain Location: left knee Pain Descriptors / Indicators: Aching;Sore Pain Intervention(s): Limited activity within patient's tolerance;Monitored during session;Premedicated before session;Ice applied    Home Living Family/patient expects to be discharged to:: Private residence Living Arrangements: Spouse/significant other Available Help at Discharge: Family                Prior Function            PT Goals (current goals can now be found in the care plan section) Acute Rehab PT Goals Patient Stated Goal: to walk without pain PT Goal Formulation: With  patient/family Time For Goal Achievement: 11/21/17 Potential to Achieve Goals: Good Progress towards PT goals: Progressing toward goals    Frequency    7X/week      PT Plan Current plan remains appropriate    Co-evaluation              AM-PAC PT "6 Clicks" Daily Activity  Outcome Measure  Difficulty turning over in bed (including adjusting bedclothes,  sheets and blankets)?: A Lot Difficulty moving from lying on back to sitting on the side of the bed? : A Lot Difficulty sitting down on and standing up from a chair with arms (e.g., wheelchair, bedside commode, etc,.)?: Unable Help needed moving to and from a bed to chair (including a wheelchair)?: A Little Help needed walking in hospital room?: A Little Help needed climbing 3-5 steps with a railing? : A Lot 6 Click Score: 13    End of Session Equipment Utilized During Treatment: Gait belt Activity Tolerance: Patient tolerated treatment well;Patient limited by fatigue Patient left: in bed;with call bell/phone within reach;with family/visitor present Nurse Communication: Mobility status PT Visit Diagnosis: Unsteadiness on feet (R26.81);Pain Pain - Right/Left: Left Pain - part of body: Knee     Time: 6122-4497 PT Time Calculation (min) (ACUTE ONLY): 38 min  Charges:  $Gait Training: 8-22 mins $Therapeutic Exercise: 8-22 mins $Therapeutic Activity: 8-22 mins                    G Codes:       Pg [phone]    Jonathan Ochoa 11/18/2017, 3:15 PM

## 2017-11-18 NOTE — Progress Notes (Signed)
Physical Therapy Treatment Patient Details Name: Jonathan Ochoa MRN: [id number] DOB: 06-Jun-1951 Today's Date: 11/18/2017    History of Present Illness L TKA, the quads are very weak on the left post op    PT Comments    Marked improvement in knee stability but pt fatigues easily with ambulation.   Follow Up Recommendations  Follow surgeon's recommendation for DC plan and follow-up therapies     Equipment Recommendations  None recommended by PT    Recommendations for Other Services OT consult     Precautions / Restrictions Precautions Precautions: Knee;Fall Required Braces or Orthoses: Knee Immobilizer - Left Knee Immobilizer - Left: Discontinue once straight leg raise with < 10 degree lag Restrictions Weight Bearing Restrictions: No Other Position/Activity Restrictions: WBAT    Mobility  Bed Mobility Overal bed mobility: Needs Assistance Bed Mobility: Supine to Sit     Supine to sit: Min assist     General bed mobility comments: assist with left leg  Transfers Overall transfer level: Needs assistance Equipment used: Rolling walker (2 wheeled) Transfers: Sit to/from Stand Sit to Stand: Min assist;Mod assist         General transfer comment: cues for LE management and use of UEs to self assist  Ambulation/Gait Ambulation/Gait assistance: Min assist;Mod assist Ambulation Distance (Feet): 41 Feet Assistive device: Rolling walker (2 wheeled) Gait Pattern/deviations: Step-to pattern;Decreased stance time - left Gait velocity: decr   General Gait Details: Cues for sequence, posture and position from RW.  Marked improvement in stability of L knee   Stairs             Wheelchair Mobility    Modified [name] (Stroke Patients Only)       Balance Overall balance assessment: Needs assistance Sitting-balance support: No upper extremity supported;Feet supported Sitting balance-Leahy Scale: Good     Standing balance support: Bilateral upper  extremity supported Standing balance-Leahy Scale: Poor                              Cognition Arousal/Alertness: Awake/alert Behavior During Therapy: WFL for tasks assessed/performed Overall Cognitive Status: Within Functional Limits for tasks assessed                                        Exercises Total Joint Exercises Ankle Circles/Pumps: AROM;Both;20 reps;Supine Quad Sets: 10 reps;Both;AROM;Supine Heel Slides: AAROM;Left;15 reps;Supine Straight Leg Raises: AAROM;Left;15 reps;Supine Goniometric ROM: AAROM L knee -12 - 80    General Comments        Pertinent Vitals/Pain Pain Assessment: 0-10 Pain Score: 5  Pain Location: left knee Pain Descriptors / Indicators: Aching;Sore Pain Intervention(s): Limited activity within patient's tolerance;Monitored during session;Premedicated before session;Ice applied    Home Living                      Prior Function            PT Goals (current goals can now be found in the care plan section) Acute Rehab PT Goals Patient Stated Goal: to walk without pain PT Goal Formulation: With patient/family Time For Goal Achievement: 11/21/17 Potential to Achieve Goals: Good Progress towards PT goals: Progressing toward goals    Frequency    7X/week      PT Plan Current plan remains appropriate    Co-evaluation  AM-PAC PT "6 Clicks" Daily Activity  Outcome Measure  Difficulty turning over in bed (including adjusting bedclothes, sheets and blankets)?: A Lot Difficulty moving from lying on back to sitting on the side of the bed? : A Lot Difficulty sitting down on and standing up from a chair with arms (e.g., wheelchair, bedside commode, etc,.)?: Unable Help needed moving to and from a bed to chair (including a wheelchair)?: A Little Help needed walking in hospital room?: A Little Help needed climbing 3-5 steps with a railing? : A Lot 6 Click Score: 13    End of Session  Equipment Utilized During Treatment: Gait belt;Left knee immobilizer Activity Tolerance: Patient tolerated treatment well;Patient limited by fatigue Patient left: in chair;with call bell/phone within reach;with family/visitor present Nurse Communication: Mobility status PT Visit Diagnosis: Unsteadiness on feet (R26.81);Pain Pain - Right/Left: Left Pain - part of body: Knee     Time: 0922-1004 PT Time Calculation (min) (ACUTE ONLY): 42 min  Charges:  $Gait Training: 8-22 mins $Therapeutic Exercise: 23-37 mins                    G Codes:       Pg [phone]    Candas Deemer 11/18/2017, 10:54 AM

## 2017-11-18 NOTE — Evaluation (Signed)
Occupational Therapy Evaluation Patient Details Name: Jonathan Ochoa MRN: [id number] DOB: 1951-01-31 Today's Date: 11/18/2017    History of Present Illness L TKA, the quads are very weak on the left post op   Clinical Impression   This 67 year old man was admitted for the above. All education was completed. No further OT is needed at this time     Follow Up Recommendations  Supervision/Assistance - 24 hour    Equipment Recommendations  3 in 1 bedside commode(delivered)    Recommendations for Other Services       Precautions / Restrictions Precautions Precautions: Knee;Fall Required Braces or Orthoses: Knee Immobilizer - Left Knee Immobilizer - Left: Discontinue once straight leg raise with < 10 degree lag Restrictions Weight Bearing Restrictions: No Other Position/Activity Restrictions: WBAT      Mobility Bed Mobility Overal bed mobility: Needs Assistance Bed Mobility: Supine to Sit     Supine to sit: Min assist Sit to supine: Min assist   General bed mobility comments: for LLE  Transfers Overall transfer level: Needs assistance Equipment used: Rolling walker (2 wheeled) Transfers: Sit to/from Stand Sit to Stand: Min assist         General transfer comment: cues for UE/LE placement    Balance Overall balance assessment: Needs assistance Sitting-balance support: No upper extremity supported;Feet supported Sitting balance-Leahy Scale: Good     Standing balance support: Bilateral upper extremity supported Standing balance-Leahy Scale: Poor                             ADL either performed or assessed with clinical judgement   ADL Overall ADL's : Needs assistance/impaired             Lower Body Bathing: Minimal assistance;Sit to/from stand       Lower Body Dressing: Minimal assistance;Moderate assistance;Sit to/from stand   Toilet Transfer: Minimal assistance;Ambulation;BSC;RW             General ADL Comments: pt needs  set up only for UB adls. Ambulated to bathroom and got sweaty/felt like he worked hard, but not dizzy nor nauseous. Rested on 3:1 commode. Demonstrated shower transfer and gave him and his wife a handout.  He feels he will just sponge bathe for awhile. Educated that they can use 3:1 in shower instead of built in seat, if he wants a higher surface and armrests. Reviewed knee precautions and donning KI.  Also showed them pillow under calf and not under knee to build up legrest on recliner at home.       Vision         Perception     Praxis      Pertinent Vitals/Pain Pain Assessment: 0-10 Pain Score: 5  Pain Location: left knee Pain Descriptors / Indicators: Aching;Sore Pain Intervention(s): Limited activity within patient's tolerance;Monitored during session;Premedicated before session;Repositioned(removed ice)     Hand Dominance     Extremity/Trunk Assessment Upper Extremity Assessment Upper Extremity Assessment: Overall WFL for tasks assessed           Communication Communication Communication: No difficulties   Cognition Arousal/Alertness: Awake/alert Behavior During Therapy: WFL for tasks assessed/performed Overall Cognitive Status: Within Functional Limits for tasks assessed                                     General Comments  Exercises Exercises: Total Joint Total Joint Exercises Ankle Circles/Pumps: AROM;Both;20 reps;Supine Quad Sets: 10 reps;Both;AROM;Supine Heel Slides: AAROM;Left;15 reps;Supine Straight Leg Raises: AAROM;Left;15 reps;Supine Goniometric ROM: AAROM L knee -12 - 80   Shoulder Instructions      Home Living Family/patient expects to be discharged to:: Private residence Living Arrangements: Spouse/significant other Available Help at Discharge: Family               Bathroom Shower/Tub: Occupational psychologist: Standard     Home Equipment: Civil engineer, contracting   Additional Comments: 3:1 delivered      Prior  Functioning/Environment                   OT Problem List:        OT Treatment/Interventions:      OT Goals(Current goals can be found in the care plan section) Acute Rehab OT Goals Patient Stated Goal: to walk without pain OT Goal Formulation: All assessment and education complete, DC therapy  OT Frequency:     Barriers to D/C:            Co-evaluation              AM-PAC PT "6 Clicks" Daily Activity     Outcome Measure Help from another person eating meals?: None Help from another person taking care of personal grooming?: A Little Help from another person toileting, which includes using toliet, bedpan, or urinal?: A Little Help from another person bathing (including washing, rinsing, drying)?: A Little Help from another person to put on and taking off regular upper body clothing?: A Little Help from another person to put on and taking off regular lower body clothing?: A Lot 6 Click Score: 18   End of Session    Activity Tolerance: Patient limited by fatigue Patient left: in bed;with call bell/phone within reach;with family/visitor present  OT Visit Diagnosis: Pain Pain - Right/Left: Left Pain - part of body: Knee                Time: 7989-2119 OT Time Calculation (min): 22 min Charges:  OT General Charges $OT Visit: 1 Visit OT Evaluation $OT Eval Low Complexity: 1 Low G-Codes:     [place], OTR/L [phone] 11/18/2017  Shanita Kanan 11/18/2017, 1:25 PM

## 2017-11-18 NOTE — Progress Notes (Signed)
   Subjective: 2 Days Post-Op Procedure(s) (LRB): LEFT TOTAL KNEE ARTHROPLASTY (Left) CORTISONE  INJECTION RIGHT KNEE (Right) Patient reports pain as moderate.   Patient seen in rounds for Dr. Wynelle Link. Patient is well, and has had no acute complaints or problems other than pain in the left knee. He reports that he is no longer having tingling in the left leg and he feels stronger, but he is having more discomfort in the left knee. No SOB or chest pain. Voiding and positive flatus.    Objective: Vital signs in last 24 hours: Temp:  [97.5 F (36.4 C)-98.2 F (36.8 C)] 98.2 F (36.8 C) (05/15 0537) Pulse Rate:  [51-55] 54 (05/15 0537) Resp:  [15-19] 17 (05/15 0537) BP: (110-131)/(65-80) 125/73 (05/15 0537) SpO2:  [96 %-99 %] 99 % (05/15 0537)  Intake/Output from previous day:  Intake/Output Summary (Last 24 hours) at 11/18/2017 0805 Last data filed at 11/18/2017 0600 Gross per 24 hour  Intake 1122.33 ml  Output 950 ml  Net 172.33 ml    Intake/Output this shift: No intake/output data recorded.  Labs: Recent Labs    11/17/17 0514 11/18/17 0522  HGB 11.2* 11.2*   Recent Labs    11/17/17 0514 11/18/17 0522  WBC 13.8* 13.8*  RBC 3.70* 3.65*  HCT 34.1* 33.9*  PLT 237 221   Recent Labs    11/17/17 0514 11/18/17 0522  NA 139 138  K 4.2 4.2  CL 107 104  CO2 23 25  BUN 15 18  CREATININE 0.79 0.72  GLUCOSE 148* 119*  CALCIUM 8.7* 8.8*    EXAM General - Patient is Alert and Oriented Extremity - Neurologically intact Intact pulses distally Dorsiflexion/Plantar flexion intact Compartment soft Dressing/Incision - clean, dry, no drainage Motor Function - intact, moving foot and toes well on exam.   Past Medical History:  Diagnosis Date  . Arthritis   . Hypothyroidism   . Thyroid disease     Assessment/Plan: 2 Days Post-Op Procedure(s) (LRB): LEFT TOTAL KNEE ARTHROPLASTY (Left) CORTISONE  INJECTION RIGHT KNEE (Right) Principal Problem:   OA  (osteoarthritis) of knee  Estimated body mass index is 29.01 kg/m as calculated from the following:   Height as of this encounter: 5\' 11"  (1.803 m).   Weight as of this encounter: 94.3 kg (208 lb). Advance diet Up with therapy  DVT Prophylaxis - Aspirin Weight-Bearing as tolerated  Will have him work with therapy today. Will need two sessions. If he progresses with therapy today, plan for DC home with outpatient therapy at [facility]. Left leg weakness yesterday appears to have been secondary to lingering block.   Ardeen Jourdain, PA-C Orthopaedic Surgery 11/18/2017, 8:05 AM

## 2017-11-20 ENCOUNTER — Ambulatory Visit (HOSPITAL_COMMUNITY): Payer: Medicare HMO | Attending: Orthopedic Surgery

## 2017-11-20 ENCOUNTER — Encounter (HOSPITAL_COMMUNITY): Payer: Self-pay

## 2017-11-20 DIAGNOSIS — G8929 Other chronic pain: Secondary | ICD-10-CM | POA: Insufficient documentation

## 2017-11-20 DIAGNOSIS — R262 Difficulty in walking, not elsewhere classified: Secondary | ICD-10-CM | POA: Diagnosis not present

## 2017-11-20 DIAGNOSIS — R600 Localized edema: Secondary | ICD-10-CM

## 2017-11-20 DIAGNOSIS — M25562 Pain in left knee: Secondary | ICD-10-CM | POA: Diagnosis not present

## 2017-11-20 DIAGNOSIS — M6281 Muscle weakness (generalized): Secondary | ICD-10-CM | POA: Diagnosis not present

## 2017-11-20 DIAGNOSIS — M25662 Stiffness of left knee, not elsewhere classified: Secondary | ICD-10-CM | POA: Diagnosis not present

## 2017-11-20 NOTE — Therapy (Signed)
[facility] [place], [place], [address] Phone: [phone]   Fax:  [phone]  Physical Therapy Evaluation  Patient Details  Name: Jonathan Ochoa MRN: [id number] Date of Birth: 1950-09-25 Referring Provider: Mickel Crow, PA-C (Surgeon: [name], MD)   Encounter Date: 11/20/2017  PT End of Session - 11/20/17 1208    Visit Number  1    Number of Visits  19    Date for PT Re-Evaluation  01/01/18 mini reassess 12/11/17    Authorization Type  [employer] HMO    Authorization Time Period  11/20/17 to 01/01/18    Authorization - Visit Number  1    Authorization - Number of Visits  10    PT Start Time  0900    PT Stop Time  0944    PT Time Calculation (min)  44 min    Activity Tolerance  Patient tolerated treatment well;Patient limited by pain    Behavior During Therapy  [facility] for tasks assessed/performed       Past Medical History:  Diagnosis Date  . Arthritis   . Hypothyroidism   . Thyroid disease     Past Surgical History:  Procedure Laterality Date  . adhesions of abdomen     from scar tissue from appendectomy  . AMPUTATION OF REPLICATED TOES     partial tip amputation of left big toe, and next toe   . APPENDECTOMY  1977  . arthroscpy      left knee -meniscus tear  . STERIOD INJECTION Right 11/16/2017   Procedure: CORTISONE  INJECTION RIGHT KNEE;  Surgeon: [name], MD;  Location: [facility];  Service: Orthopedics;  Laterality: Right;  . TOE SURGERY    . TOTAL KNEE ARTHROPLASTY Left 11/16/2017   Procedure: LEFT TOTAL KNEE ARTHROPLASTY;  Surgeon: [name], MD;  Location: [facility];  Service: Orthopedics;  Laterality: Left;    There were no vitals filed for this visit.   Subjective Assessment - 11/20/17 0906    Subjective  Pt reports undergoing L TKA on 11/16/17 by Dr. Wynelle Link. He did not have any HHPT following his discharge from the hospital. He reports having increased L knee pain for  approximately 2 years and had a knee scope prior to his TKA. He was not using any AD prior to the surgery. He is currently having the most difficulty with sleeping and bending his knee. He states that at ths hospital he could lift his leg but now he can't. His knee pain isn't so bad unless he get s it in certain positions.    Limitations  Walking    How long can you sit comfortably?  30-60 mins    How long can you stand comfortably?  <5 mins    How long can you walk comfortably?  unsure    Patient Stated Goals  get well    Currently in Pain?  Yes    Pain Score  2     Pain Location  Knee    Pain Orientation  Left    Pain Descriptors / Indicators  Throbbing    Pain Type  Surgical pain    Pain Onset  In the past 7 days    Pain Frequency  Intermittent    Aggravating Factors   bending, trying to stand up    Pain Relieving Factors  rest    Effect of Pain on Daily Activities  increases         [facility]  PT Assessment - 11/20/17 0001      Assessment   Medical Diagnosis  L TKA    Referring Provider  Mickel Crow, PA-C Surgeon: [name], MD    Onset Date/Surgical Date  11/16/17    Next MD Visit  12/01/17    Prior Therapy  none      Restrictions   Weight Bearing Restrictions  No      Balance Screen   Has the patient fallen in the past 6 months  No    Has the patient had a decrease in activity level because of a fear of falling?   No    Is the patient reluctant to leave their home because of a fear of falling?   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)    Leisure  relax, sit at home      Observation/Other Assessments   Focus on Therapeutic Outcomes (FOTO)   77% limitation      Observation/Other Assessments-Edema    Edema  Circumferential      Circumferential Edema   Circumferential - Right  42cm joint line    Circumferential - Left   47cm joint line (over  gauze)      ROM / Strength   AROM / PROM / Strength  AROM;Strength      AROM   AROM Assessment Site  Knee    Right/Left Knee  Left;Right    Right Knee Extension  7    Right Knee Flexion  126    Left Knee Extension  19    Left Knee Flexion  77      Strength   Strength Assessment Site  Hip;Knee;Ankle    Right Hip Flexion  4+/5    Right Hip Extension  2+/5    Right Hip ABduction  4-/5    Left Hip Flexion  3-/5    Left Hip Extension  3-/5    Left Hip ABduction  3-/5    Right Knee Flexion  5/5    Right Knee Extension  5/5    Left Knee Flexion  3/5 through available range    Left Knee Extension  3-/5 through available range    Right Ankle Dorsiflexion  5/5    Left Ankle Dorsiflexion  5/5      Palpation   Patella mobility  hypombile throughout    Palpation comment  increased soft tissue restrictions throughout knee musculature      Ambulation/Gait   Ambulation Distance (Feet)  218 Feet 3MWT    Assistive device  Rolling walker    Gait Pattern  Step-through pattern;Decreased step length - right;Decreased stance time - left;Decreased stride length;Decreased dorsiflexion - left;Decreased hip/knee flexion - left;Trendelenburg;Antalgic;Trunk flexed;Left foot flat increased knee flexion in stance BLE, decr heel strike    Gait Comments  increased hip flexion/lumbar extension      Balance   Balance Assessed  Yes      Static Standing Balance   Static Standing - Balance Support  No upper extremity supported    Static Standing Balance -  Activities   Single Leg Stance - Right Leg;Single Leg Stance - Left Leg    Static Standing - Comment/# of Minutes  R: 3 sec or < L: 0 sec      Standardized Balance Assessment   Standardized Balance Assessment  Five Times Sit  to Stand    Five times sit to stand comments   17 sec, chair, no UE, LLE extended            Objective measurements completed on examination: See above findings.       PT Education - 11/20/17 1208    Education provided   Yes    Education Details  exam findings, POC, HEP    Person(s) Educated  Patient;Spouse    Methods  Explanation;Demonstration;Handout    Comprehension  Verbalized understanding;Returned demonstration       PT Short Term Goals - 11/20/17 1213      PT SHORT TERM GOAL #1   Title  Pt will be independent with HEP and perform consistently in order to improve ROM and strength to promtoe return to PLOF.     Time  3    Period  Weeks    Status  New    Target Date  12/11/17      PT SHORT TERM GOAL #2   Title  Pt will have decreased L knee edema by 2cm or > at joint line in order to decrease pain and improve ROM.    Time  3    Period  Weeks    Status  New    Target Date  12/11/17      PT SHORT TERM GOAL #3   Title  Pt will have improved L knee AROM from 10-100deg in order to decrease pain and maximize gait.    Time  3    Period  Weeks    Status  New      PT SHORT TERM GOAL #4   Title  Pt will have 1/2 grade improvement in MMT throughout all in order to maximize balance, gait, and ability to perform functional tasks at home and in the community.    Time  3    Period  Weeks    Status  New        PT Long Term Goals - 11/20/17 1243      PT LONG TERM GOAL #1   Title  Pt will have improved L knee AROM from 5-115deg or better in order to further decrease pain and improve gait and stair ambulation.    Time  6    Period  Weeks    Status  New    Target Date  01/01/18      PT LONG TERM GOAL #2   Title  Pt will have 1 grade improvement in MMT throughout all muscle groups tested in order to further maximize STS transfers, stairs, and overall gait.     Time  6    Period  Weeks    Status  New      PT LONG TERM GOAL #3   Title  Pt will be able to perform 5xSTS in 12 sec or < with no UE and proper mechanics in order to demo improved overall functional strength.    Time  6    Period  Weeks    Status  New      PT LONG TERM GOAL #4   Title  Pt will be able to perform bil SLS for 10 sec  or > with no UE in order to demo improved balance and to maximize gait and stair ambulation.    Time  6    Period  Weeks    Status  New      PT LONG TERM GOAL #5   Title  Pt will have improved 3MWT by 174ft or > with LRAD and gait mechanics WFL and without increases in L knee pain in order to maximzie pt's community access.    Time  6    Period  Weeks    Status  New             Plan - 11/20/17 1209    Clinical Impression Statement  Pt presents to OPPT s/p L TKA on 11/16/17 by Dr. Wynelle Link. He currently presents with post-op deficits in swelling, ROM, MMT, gait, balance, functional strength, and pain. Pt currently has bariatric RW and PT educated pt to try and get a regular RW as the bariatric is too big for him. He has max gait deviations as illustrated above. He has 5cm of swelling joint line compared to the R knee. His AROM this date was 19-77deg. Pt needs skilled PT intervention to address deficits in order to decrease pain and edema and increase strength, ROM, and overall return to PLOF.     History and Personal Factors relevant to plan of care:  chronic history of knee pain, acute stage of recovery, motivated to get better    Clinical Presentation  Stable    Clinical Presentation due to:  ROM, MMT, SLS, gait, edema, functional strength, 5xSTS    Clinical Decision Making  Low    Rehab Potential  Good    PT Frequency  3x / week    PT Duration  6 weeks    PT Treatment/Interventions  ADLs/Self Care Home Management;Biofeedback;Cryotherapy;Electrical Stimulation;Moist Heat;Ultrasound;DME Instruction;Gait training;Stair training;Functional mobility training;Therapeutic activities;Therapeutic exercise;Balance training;Neuromuscular re-education;Patient/family education;Manual techniques;Scar mobilization;Passive range of motion;Dry needling;Energy conservation;Taping;Compression bandaging    PT Next Visit Plan  review eval and HEP; begin addressing ROM and edema    PT Home Exercise Plan   eval: heel slides, quad set, supine HS stretch    Consulted and Agree with Plan of Care  Patient;Family member/caregiver    Family Member Consulted  wife       Patient will benefit from skilled therapeutic intervention in order to improve the following deficits and impairments:  Abnormal gait, Decreased activity tolerance, Decreased balance, Decreased endurance, Decreased mobility, Decreased range of motion, Decreased scar mobility, Decreased strength, Difficulty walking, Hypomobility, Increased edema, Increased fascial restricitons, Increased muscle spasms, Impaired flexibility, Improper body mechanics, Pain  Visit Diagnosis: Stiffness of left knee, not elsewhere classified - Plan: PT plan of care cert/re-cert  Chronic pain of left knee - Plan: PT plan of care cert/re-cert  Localized edema - Plan: PT plan of care cert/re-cert  Muscle weakness (generalized) - Plan: PT plan of care cert/re-cert  Difficulty in walking, not elsewhere classified - Plan: PT plan of care cert/re-cert     Problem List Patient Active Problem List   Diagnosis Date Noted  . OA (osteoarthritis) of knee 11/16/2017      Geraldine Solar PT, DPT  [facility] [address] [place], [place], [address] Phone: [phone]   Fax:  [phone]  Name: DEJAN ANGERT MRN: [id number] Date of Birth: 1950/08/12

## 2017-11-20 NOTE — Patient Instructions (Signed)
Access Code: [email or web address]  URL: [email or web address]  Date: 11/20/2017  Prepared by: Geraldine Solar   Exercises Long Sitting Quad Set - 10 reps - 2-3 sets - 5-10 hold - 2x daily - 7x weekly Supine Heel Slide with Strap - 10 reps - 2-3 sets - 5-10 hold - 2x daily - 7x weekly Supine Hamstring Stretch with Strap - 3-5 reps - 30-60 hold - 2x daily - 7x weekly

## 2017-11-23 ENCOUNTER — Ambulatory Visit (HOSPITAL_COMMUNITY): Payer: Medicare HMO | Admitting: Physical Therapy

## 2017-11-23 DIAGNOSIS — G8929 Other chronic pain: Secondary | ICD-10-CM | POA: Diagnosis not present

## 2017-11-23 DIAGNOSIS — R262 Difficulty in walking, not elsewhere classified: Secondary | ICD-10-CM

## 2017-11-23 DIAGNOSIS — R600 Localized edema: Secondary | ICD-10-CM

## 2017-11-23 DIAGNOSIS — M25562 Pain in left knee: Secondary | ICD-10-CM

## 2017-11-23 DIAGNOSIS — M6281 Muscle weakness (generalized): Secondary | ICD-10-CM | POA: Diagnosis not present

## 2017-11-23 DIAGNOSIS — M25662 Stiffness of left knee, not elsewhere classified: Secondary | ICD-10-CM

## 2017-11-23 NOTE — Therapy (Signed)
[facility] [place], [place], [address] Phone: [phone]   Fax:  [phone]  Physical Therapy Treatment  Patient Details  Name: Jonathan Ochoa MRN: [id number] Date of Birth: 1951-06-10 Referring Provider: Mickel Crow, PA-C (Surgeon: [name], MD)   Encounter Date: 11/23/2017  PT End of Session - 11/23/17 1613    Visit Number  2    Number of Visits  19    Date for PT Re-Evaluation  01/01/18 mini reassess 12/11/17    Authorization Type  [employer] HMO    Authorization Time Period  11/20/17 to 01/01/18    Authorization - Visit Number  2    Authorization - Number of Visits  10    PT Start Time  4315    PT Stop Time  1515    PT Time Calculation (min)  38 min    Activity Tolerance  Patient tolerated treatment well;Patient limited by pain    Behavior During Therapy  [facility] for tasks assessed/performed       Past Medical History:  Diagnosis Date  . Arthritis   . Hypothyroidism   . Thyroid disease     Past Surgical History:  Procedure Laterality Date  . adhesions of abdomen     from scar tissue from appendectomy  . AMPUTATION OF REPLICATED TOES     partial tip amputation of left big toe, and next toe   . APPENDECTOMY  1977  . arthroscpy      left knee -meniscus tear  . STERIOD INJECTION Right 11/16/2017   Procedure: CORTISONE  INJECTION RIGHT KNEE;  Surgeon: [name], MD;  Location: [facility];  Service: Orthopedics;  Laterality: Right;  . TOE SURGERY    . TOTAL KNEE ARTHROPLASTY Left 11/16/2017   Procedure: LEFT TOTAL KNEE ARTHROPLASTY;  Surgeon: [name], MD;  Location: [facility];  Service: Orthopedics;  Laterality: Left;    There were no vitals filed for this visit.  Subjective Assessment - 11/23/17 1620    Subjective  pt reports complaince with HEP.  Stiffness and pain persists.    Pertinent History  Lt TKA 11/16/17 [place]    Currently in Pain?  Yes    Pain Score  2     Pain Location   Knee    Pain Orientation  Left    Pain Descriptors / Indicators  Throbbing                       [facility] Adult PT Treatment/Exercise - 11/23/17 0001      Knee/Hip Exercises: Stretches   Active Hamstring Stretch  Left;2 reps;30 seconds    Active Hamstring Stretch Limitations  supine      Knee/Hip Exercises: Seated   Long Arc Quad  Left;10 reps      Knee/Hip Exercises: Supine   Quad Sets  Left;10 reps    Short Arc Quad Sets  Left;10 reps    Heel Slides  Left;10 reps      Knee/Hip Exercises: Prone   Hamstring Curl  10 reps    Prone Knee Hang  5 minutes    Prone Knee Hang Limitations  with manual to posterior knee      Manual Therapy   Manual Therapy  Edema management;Soft tissue mobilization    Manual therapy comments  completed seperately from all other skilled interventions    Edema Management  Lt knee     Soft tissue mobilization  to decrease adhesions and  pain to posterior knee              PT Education - 11/23/17 1612    Education provided  Yes    Education Details  evaluation goals and HEP.  Given imformation on the loan closet to obtain a proper walker.    Person(s) Educated  Patient    Methods  Explanation;Tactile cues;Handout;Demonstration;Verbal cues    Comprehension  Verbalized understanding;Returned demonstration;Verbal cues required;Tactile cues required;Need further instruction       PT Short Term Goals - 11/20/17 1213      PT SHORT TERM GOAL #1   Title  Pt will be independent with HEP and perform consistently in order to improve ROM and strength to promtoe return to PLOF.     Time  3    Period  Weeks    Status  New    Target Date  12/11/17      PT SHORT TERM GOAL #2   Title  Pt will have decreased L knee edema by 2cm or > at joint line in order to decrease pain and improve ROM.    Time  3    Period  Weeks    Status  New    Target Date  12/11/17      PT SHORT TERM GOAL #3   Title  Pt will have improved L knee AROM from 10-100deg  in order to decrease pain and maximize gait.    Time  3    Period  Weeks    Status  New      PT SHORT TERM GOAL #4   Title  Pt will have 1/2 grade improvement in MMT throughout all in order to maximize balance, gait, and ability to perform functional tasks at home and in the community.    Time  3    Period  Weeks    Status  New        PT Long Term Goals - 11/20/17 1243      PT LONG TERM GOAL #1   Title  Pt will have improved L knee AROM from 5-115deg or better in order to further decrease pain and improve gait and stair ambulation.    Time  6    Period  Weeks    Status  New    Target Date  01/01/18      PT LONG TERM GOAL #2   Title  Pt will have 1 grade improvement in MMT throughout all muscle groups tested in order to further maximize STS transfers, stairs, and overall gait.     Time  6    Period  Weeks    Status  New      PT LONG TERM GOAL #3   Title  Pt will be able to perform 5xSTS in 12 sec or < with no UE and proper mechanics in order to demo improved overall functional strength.    Time  6    Period  Weeks    Status  New      PT LONG TERM GOAL #4   Title  Pt will be able to perform bil SLS for 10 sec or > with no UE in order to demo improved balance and to maximize gait and stair ambulation.    Time  6    Period  Weeks    Status  New      PT LONG TERM GOAL #5   Title  Pt will have improved 3MWT by 135ft or >  with LRAD and gait mechanics WFL and without increases in L knee pain in order to maximzie pt's community access.    Time  6    Period  Weeks    Status  New            Plan - 11/23/17 1614    Clinical Impression Statement  Reveiwed evaluation and goals.  Pt able to complete HEP with minimal cues given.  Encouraged pateint to spend more time in prone to stretch knee.  Attempted to adjust walker, however would not go higher to height.  Pt to obtain proper walker and given infomration on loan closet here in town.  ROM improved today from initial visit to  14-88 degrees (was 19-77 degrees). NOted improvment in ambualtion at EOS following ROM and manual to Lt knee.      Rehab Potential  Good    PT Frequency  3x / week    PT Duration  6 weeks    PT Treatment/Interventions  ADLs/Self Care Home Management;Biofeedback;Cryotherapy;Electrical Stimulation;Moist Heat;Ultrasound;DME Instruction;Gait training;Stair training;Functional mobility training;Therapeutic activities;Therapeutic exercise;Balance training;Neuromuscular re-education;Patient/family education;Manual techniques;Scar mobilization;Passive range of motion;Dry needling;Energy conservation;Taping;Compression bandaging    PT Next Visit Plan  continue with focus on ROM and edema of Lt knee.    PT Home Exercise Plan  eval: heel slides, quad set, supine HS stretch    Consulted and Agree with Plan of Care  Patient;Family member/caregiver    Family Member Consulted  wife       Patient will benefit from skilled therapeutic intervention in order to improve the following deficits and impairments:  Abnormal gait, Decreased activity tolerance, Decreased balance, Decreased endurance, Decreased mobility, Decreased range of motion, Decreased scar mobility, Decreased strength, Difficulty walking, Hypomobility, Increased edema, Increased fascial restricitons, Increased muscle spasms, Impaired flexibility, Improper body mechanics, Pain  Visit Diagnosis: Stiffness of left knee, not elsewhere classified  Chronic pain of left knee  Localized edema  Muscle weakness (generalized)  Difficulty in walking, not elsewhere classified     Problem List Patient Active Problem List   Diagnosis Date Noted  . OA (osteoarthritis) of knee 11/16/2017   Teena Irani, PTA/CLT [phone]  Teena Irani 11/23/2017, 4:22 PM  [facility] [address] [place], [place], [address] Phone: [phone]   Fax:  [phone]  Name: Jonathan Ochoa MRN: [id number] Date of  Birth: 21-Oct-1950

## 2017-11-24 DIAGNOSIS — S80862A Insect bite (nonvenomous), left lower leg, initial encounter: Secondary | ICD-10-CM | POA: Diagnosis not present

## 2017-11-24 DIAGNOSIS — Z1389 Encounter for screening for other disorder: Secondary | ICD-10-CM | POA: Diagnosis not present

## 2017-11-24 DIAGNOSIS — E063 Autoimmune thyroiditis: Secondary | ICD-10-CM | POA: Diagnosis not present

## 2017-11-24 DIAGNOSIS — Z6828 Body mass index (BMI) 28.0-28.9, adult: Secondary | ICD-10-CM | POA: Diagnosis not present

## 2017-11-24 DIAGNOSIS — M1991 Primary osteoarthritis, unspecified site: Secondary | ICD-10-CM | POA: Diagnosis not present

## 2017-11-25 ENCOUNTER — Ambulatory Visit (HOSPITAL_COMMUNITY): Payer: Medicare HMO

## 2017-11-25 ENCOUNTER — Encounter (HOSPITAL_COMMUNITY): Payer: Self-pay

## 2017-11-25 DIAGNOSIS — M6281 Muscle weakness (generalized): Secondary | ICD-10-CM

## 2017-11-25 DIAGNOSIS — R262 Difficulty in walking, not elsewhere classified: Secondary | ICD-10-CM | POA: Diagnosis not present

## 2017-11-25 DIAGNOSIS — M25662 Stiffness of left knee, not elsewhere classified: Secondary | ICD-10-CM

## 2017-11-25 DIAGNOSIS — R600 Localized edema: Secondary | ICD-10-CM | POA: Diagnosis not present

## 2017-11-25 DIAGNOSIS — M25562 Pain in left knee: Secondary | ICD-10-CM | POA: Diagnosis not present

## 2017-11-25 DIAGNOSIS — G8929 Other chronic pain: Secondary | ICD-10-CM | POA: Diagnosis not present

## 2017-11-25 NOTE — Therapy (Signed)
[place], [place], [address] Phone: [phone]   Fax:  [phone]  Physical Therapy Treatment  Patient Details  Name: Jonathan Ochoa MRN: [id number] Date of Birth: 1950/07/12 Referring Provider: Mickel Crow, PA-C (Surgeon: [name], MD)   Encounter Date: 11/25/2017  PT End of Session - 11/25/17 1435    Visit Number  3    Number of Visits  19    Date for PT Re-Evaluation  01/01/18 mini reassess 12/11/17    Authorization Type  [employer] HMO    Authorization Time Period  11/20/17 to 01/01/18    Authorization - Visit Number  3    Authorization - Number of Visits  10    PT Start Time  6789    PT Stop Time  1512    PT Time Calculation (min)  41 min    Activity Tolerance  Patient tolerated treatment well;Patient limited by pain    Behavior During Therapy  [facility] for tasks assessed/performed       Past Medical History:  Diagnosis Date  . Arthritis   . Hypothyroidism   . Thyroid disease     Past Surgical History:  Procedure Laterality Date  . adhesions of abdomen     from scar tissue from appendectomy  . AMPUTATION OF REPLICATED TOES     partial tip amputation of left big toe, and next toe   . APPENDECTOMY  1977  . arthroscpy      left knee -meniscus tear  . STERIOD INJECTION Right 11/16/2017   Procedure: CORTISONE  INJECTION RIGHT KNEE;  Surgeon: [name], MD;  Location: [facility];  Service: Orthopedics;  Laterality: Right;  . TOE SURGERY    . TOTAL KNEE ARTHROPLASTY Left 11/16/2017   Procedure: LEFT TOTAL KNEE ARTHROPLASTY;  Surgeon: [name], MD;  Location: [facility];  Service: Orthopedics;  Laterality: Left;    There were no vitals filed for this visit.  Subjective Assessment - 11/25/17 1436    Subjective  Pt states that he got another RW and feels this one is the correct one. He just feels weak and his knee stiffens up when he sits for a while.     Pertinent History  Lt TKA  11/16/17 Aluisio    Currently in Pain?  Yes    Pain Score  1     Pain Location  Knee    Pain Orientation  Left    Pain Descriptors / Indicators  Throbbing    Pain Type  Surgical pain    Pain Onset  1 to 4 weeks ago    Pain Frequency  Intermittent    Aggravating Factors   beding, trying to stand up    Pain Relieving Factors  rest    Effect of Pain on Daily Activities  increases            [facility] Adult PT Treatment/Exercise - 11/25/17 0001      Knee/Hip Exercises: Stretches   Active Hamstring Stretch  Left;3 reps;30 seconds    Active Hamstring Stretch Limitations  supine    Gastroc Stretch  Both;3 reps;30 seconds;Limitations    Gastroc Stretch Limitations  slant board      Knee/Hip Exercises: Standing   Rocker Board  2 minutes R/L    Gait Training  x1 lap around gym wtih RW; cues to maintain upright posture      Knee/Hip Exercises: Seated   Long Arc Quad  Left;15 reps  Knee/Hip Exercises: Supine   Quad Sets  Left;15 reps;Limitations    Quad Sets Limitations  in long sitting    Short Arc Quad Sets  Left;15 reps    Short Arc Quad Sets Limitations  5" holds    Heel Slides  Left;15 reps    Knee Extension Limitations  14    Knee Flexion Limitations  99      Manual Therapy   Manual Therapy  Edema management    Manual therapy comments  completed seperately from all other skilled interventions    Edema Management  retro massage with BLE elevated + ankle pumps            PT Education - 11/25/17 1437    Education provided  Yes    Education Details  exercise technique, continue HEP, proper gait    Person(s) Educated  Patient    Methods  Explanation;Demonstration    Comprehension  Verbalized understanding;Returned demonstration       PT Short Term Goals - 11/20/17 1213      PT SHORT TERM GOAL #1   Title  Pt will be independent with HEP and perform consistently in order to improve ROM and strength to promtoe return to PLOF.     Time  3    Period  Weeks    Status   New    Target Date  12/11/17      PT SHORT TERM GOAL #2   Title  Pt will have decreased L knee edema by 2cm or > at joint line in order to decrease pain and improve ROM.    Time  3    Period  Weeks    Status  New    Target Date  12/11/17      PT SHORT TERM GOAL #3   Title  Pt will have improved L knee AROM from 10-100deg in order to decrease pain and maximize gait.    Time  3    Period  Weeks    Status  New      PT SHORT TERM GOAL #4   Title  Pt will have 1/2 grade improvement in MMT throughout all in order to maximize balance, gait, and ability to perform functional tasks at home and in the community.    Time  3    Period  Weeks    Status  New        PT Long Term Goals - 11/20/17 1243      PT LONG TERM GOAL #1   Title  Pt will have improved L knee AROM from 5-115deg or better in order to further decrease pain and improve gait and stair ambulation.    Time  6    Period  Weeks    Status  New    Target Date  01/01/18      PT LONG TERM GOAL #2   Title  Pt will have 1 grade improvement in MMT throughout all muscle groups tested in order to further maximize STS transfers, stairs, and overall gait.     Time  6    Period  Weeks    Status  New      PT LONG TERM GOAL #3   Title  Pt will be able to perform 5xSTS in 12 sec or < with no UE and proper mechanics in order to demo improved overall functional strength.    Time  6    Period  Weeks    Status  New  PT LONG TERM GOAL #4   Title  Pt will be able to perform bil SLS for 10 sec or > with no UE in order to demo improved balance and to maximize gait and stair ambulation.    Time  6    Period  Weeks    Status  New      PT LONG TERM GOAL #5   Title  Pt will have improved 3MWT by 146ft or > with LRAD and gait mechanics WFL and without increases in L knee pain in order to maximzie pt's community access.    Time  6    Period  Weeks    Status  New            Plan - 11/25/17 1517    Clinical Impression Statement   Continued with established POC focusing on ROM and edema. Pt able to have much improved quad contraction when performing in long sitting so PT recommends continuing quad set in this position. Able to add standing calf stretch and rockerboard this date all with good tolerance. Pt's gait more upright his date but he continues to require cues to decrease fwd trunk flexion. Ended with manual for edema management. Pt's AROM improving each session as he was 14-99deg this date. Continue as planned, progressing as able.     Rehab Potential  Good    PT Frequency  3x / week    PT Duration  6 weeks    PT Treatment/Interventions  ADLs/Self Care Home Management;Biofeedback;Cryotherapy;Electrical Stimulation;Moist Heat;Ultrasound;DME Instruction;Gait training;Stair training;Functional mobility training;Therapeutic activities;Therapeutic exercise;Balance training;Neuromuscular re-education;Patient/family education;Manual techniques;Scar mobilization;Passive range of motion;Dry needling;Energy conservation;Taping;Compression bandaging    PT Next Visit Plan  continue to focus on ROM and edema of Lt knee    PT Home Exercise Plan  eval: heel slides, quad set, supine HS stretch    Consulted and Agree with Plan of Care  Patient    Family Member Consulted  --       Patient will benefit from skilled therapeutic intervention in order to improve the following deficits and impairments:  Abnormal gait, Decreased activity tolerance, Decreased balance, Decreased endurance, Decreased mobility, Decreased range of motion, Decreased scar mobility, Decreased strength, Difficulty walking, Hypomobility, Increased edema, Increased fascial restricitons, Increased muscle spasms, Impaired flexibility, Improper body mechanics, Pain  Visit Diagnosis: Stiffness of left knee, not elsewhere classified  Chronic pain of left knee  Localized edema  Muscle weakness (generalized)  Difficulty in walking, not elsewhere  classified     Problem List Patient Active Problem List   Diagnosis Date Noted  . OA (osteoarthritis) of knee 11/16/2017       Geraldine Solar PT, DPT  [place] [address] [place], [place], [address] Phone: [phone]   Fax:  [phone]  Name: VINCIENT VANAMAN MRN: [id number] Date of Birth: 03-16-1951

## 2017-11-27 ENCOUNTER — Encounter (HOSPITAL_COMMUNITY): Payer: Self-pay

## 2017-11-27 ENCOUNTER — Ambulatory Visit (HOSPITAL_COMMUNITY): Payer: Medicare HMO

## 2017-11-27 DIAGNOSIS — M25662 Stiffness of left knee, not elsewhere classified: Secondary | ICD-10-CM | POA: Diagnosis not present

## 2017-11-27 DIAGNOSIS — G8929 Other chronic pain: Secondary | ICD-10-CM | POA: Diagnosis not present

## 2017-11-27 DIAGNOSIS — R600 Localized edema: Secondary | ICD-10-CM | POA: Diagnosis not present

## 2017-11-27 DIAGNOSIS — M25562 Pain in left knee: Secondary | ICD-10-CM

## 2017-11-27 DIAGNOSIS — M6281 Muscle weakness (generalized): Secondary | ICD-10-CM

## 2017-11-27 DIAGNOSIS — R262 Difficulty in walking, not elsewhere classified: Secondary | ICD-10-CM | POA: Diagnosis not present

## 2017-11-27 NOTE — Therapy (Signed)
[place], [place], [address] Phone: [phone]   Fax:  [phone]  Physical Therapy Treatment  Patient Details  Name: Jonathan Ochoa MRN: [id number] Date of Birth: Mar 06, 1951 Referring Provider: Mickel Crow, PA-C (Surgeon: [name], MD)   Encounter Date: 11/27/2017  PT End of Session - 11/27/17 1526    Visit Number  4    Number of Visits  16    Date for PT Re-Evaluation  01/01/18 Mini-reassess 12/11/17    Authorization Type  [employer] HMO    Authorization Time Period  11/20/17 to 01/01/18    Authorization - Visit Number  4    Authorization - Number of Visits  10    PT Start Time  3716    PT Stop Time  1558    PT Time Calculation (min)  39 min    Activity Tolerance  Patient tolerated treatment well;Patient limited by pain    Behavior During Therapy  [facility] for tasks assessed/performed       Past Medical History:  Diagnosis Date  . Arthritis   . Hypothyroidism   . Thyroid disease     Past Surgical History:  Procedure Laterality Date  . adhesions of abdomen     from scar tissue from appendectomy  . AMPUTATION OF REPLICATED TOES     partial tip amputation of left big toe, and next toe   . APPENDECTOMY  1977  . arthroscpy      left knee -meniscus tear  . STERIOD INJECTION Right 11/16/2017   Procedure: CORTISONE  INJECTION RIGHT KNEE;  Surgeon: [name], MD;  Location: [facility];  Service: Orthopedics;  Laterality: Right;  . TOE SURGERY    . TOTAL KNEE ARTHROPLASTY Left 11/16/2017   Procedure: LEFT TOTAL KNEE ARTHROPLASTY;  Surgeon: [name], MD;  Location: [facility];  Service: Orthopedics;  Laterality: Left;    There were no vitals filed for this visit.  Subjective Assessment - 11/27/17 1524    Subjective  Pt stated he has minimal throbbing pain, no reports of pain currently.  Reports compliance with HEP with HEP 2x daily.  Reports knee continues to stiffen up if sits too long     Pertinent History  Lt TKA 11/16/17 Aluisio    Patient Stated Goals  get well    Currently in Pain?  No/denies intermittent throbbing on occassion         [facility] PT Assessment - 11/27/17 0001      Assessment   Medical Diagnosis  L TKA    Referring Provider  Mickel Crow, PA-C Surgeon: [name], MD    Onset Date/Surgical Date  11/16/17    Next MD Visit  12/01/17    Prior Therapy  none                   [facility] Adult PT Treatment/Exercise - 11/27/17 0001      Ambulation/Gait   Ambulation Distance (Feet)  226 Feet    Assistive device  Rolling walker    Gait Pattern  Step-through pattern;Decreased step length - right;Decreased stance time - left;Decreased stride length;Decreased dorsiflexion - left;Decreased hip/knee flexion - left;Trendelenburg;Antalgic;Trunk flexed;Left foot flat    Gait Comments  increased hip flexion/lumbar extension      Knee/Hip Exercises: Stretches   Active Hamstring Stretch  Left;3 reps;30 seconds    Active Hamstring Stretch Limitations  supine    Knee: Self-Stretch to increase Flexion  5 reps;10 seconds knee drive  on 12in step 10" holds    Press photographer  Both;3 reps;30 seconds;Limitations    Gastroc Stretch Limitations  slant board      Knee/Hip Exercises: Standing   Heel Raises  10 reps Toe raises on incline slope    Rocker Board  2 minutes lateral    Gait Training  244ft w/ RW; cueing for posture and heel to toe mechanics      Knee/Hip Exercises: Supine   Quad Sets  Left;15 reps;Limitations    Short Arc Quad Sets  Left;15 reps    Short Arc Quad Sets Limitations  5" holds    Heel Slides  Left;15 reps    Knee Extension  AROM    Knee Extension Limitations  12 was 14    Knee Flexion  AROM    Knee Flexion Limitations  106  was 99               PT Short Term Goals - 11/20/17 1213      PT SHORT TERM GOAL #1   Title  Pt will be independent with HEP and perform consistently in order to improve ROM and strength to  promtoe return to PLOF.     Time  3    Period  Weeks    Status  New    Target Date  12/11/17      PT SHORT TERM GOAL #2   Title  Pt will have decreased L knee edema by 2cm or > at joint line in order to decrease pain and improve ROM.    Time  3    Period  Weeks    Status  New    Target Date  12/11/17      PT SHORT TERM GOAL #3   Title  Pt will have improved L knee AROM from 10-100deg in order to decrease pain and maximize gait.    Time  3    Period  Weeks    Status  New      PT SHORT TERM GOAL #4   Title  Pt will have 1/2 grade improvement in MMT throughout all in order to maximize balance, gait, and ability to perform functional tasks at home and in the community.    Time  3    Period  Weeks    Status  New        PT Long Term Goals - 11/20/17 1243      PT LONG TERM GOAL #1   Title  Pt will have improved L knee AROM from 5-115deg or better in order to further decrease pain and improve gait and stair ambulation.    Time  6    Period  Weeks    Status  New    Target Date  01/01/18      PT LONG TERM GOAL #2   Title  Pt will have 1 grade improvement in MMT throughout all muscle groups tested in order to further maximize STS transfers, stairs, and overall gait.     Time  6    Period  Weeks    Status  New      PT LONG TERM GOAL #3   Title  Pt will be able to perform 5xSTS in 12 sec or < with no UE and proper mechanics in order to demo improved overall functional strength.    Time  6    Period  Weeks    Status  New      PT LONG TERM  GOAL #4   Title  Pt will be able to perform bil SLS for 10 sec or > with no UE in order to demo improved balance and to maximize gait and stair ambulation.    Time  6    Period  Weeks    Status  New      PT LONG TERM GOAL #5   Title  Pt will have improved 3MWT by 158ft or > with LRAD and gait mechanics WFL and without increases in L knee pain in order to maximzie pt's community access.    Time  6    Period  Weeks    Status  New             Plan - 11/27/17 1556    Clinical Impression Statement  Session focus on knee mobility.  Began session with gait training to improve knee mobilty wtih gait, min cueing for posture and heel to toe mechanics.  Rockerboard to improve weight distribution and standing stretches for flexibilty.  Added heel raises for gastroc strenghtening for gait strengthening.  Manual technqiues complete for edema control prior ROM based exercises.  Min verbal/tactilie cueing to reduce gluteal activation and improve quad contraction, pt presents with better control with long sitting vs supine.  AROM at EOS improved 12-106 degrees (was 14-99 degress last session).  No reports of increased pain through session.      Rehab Potential  Good    PT Frequency  3x / week    PT Duration  6 weeks    PT Treatment/Interventions  ADLs/Self Care Home Management;Biofeedback;Cryotherapy;Electrical Stimulation;Moist Heat;Ultrasound;DME Instruction;Gait training;Stair training;Functional mobility training;Therapeutic activities;Therapeutic exercise;Balance training;Neuromuscular re-education;Patient/family education;Manual techniques;Scar mobilization;Passive range of motion;Dry needling;Energy conservation;Taping;Compression bandaging    PT Next Visit Plan  MD apt on 12/01/17, send mini progress note with ROM to MD.  continue to focus on ROM and edema of Lt knee.      PT Home Exercise Plan  eval: heel slides, quad set, supine HS stretch       Patient will benefit from skilled therapeutic intervention in order to improve the following deficits and impairments:  Abnormal gait, Decreased activity tolerance, Decreased balance, Decreased endurance, Decreased mobility, Decreased range of motion, Decreased scar mobility, Decreased strength, Difficulty walking, Hypomobility, Increased edema, Increased fascial restricitons, Increased muscle spasms, Impaired flexibility, Improper body mechanics, Pain  Visit Diagnosis: Stiffness of left  knee, not elsewhere classified  Chronic pain of left knee  Localized edema  Muscle weakness (generalized)  Difficulty in walking, not elsewhere classified     Problem List Patient Active Problem List   Diagnosis Date Noted  . OA (osteoarthritis) of knee 11/16/2017   Ihor Austin, LPTA; CBIS [phone]  Aldona Lento 11/27/2017, 4:02 PM  [place], [place], [address] Phone: [phone]   Fax:  [phone]  Name: Jonathan Ochoa MRN: [id number] Date of Birth: 1951/05/24

## 2017-12-01 ENCOUNTER — Ambulatory Visit (HOSPITAL_COMMUNITY): Payer: Medicare HMO | Admitting: Physical Therapy

## 2017-12-01 DIAGNOSIS — M25562 Pain in left knee: Secondary | ICD-10-CM | POA: Diagnosis not present

## 2017-12-01 DIAGNOSIS — R262 Difficulty in walking, not elsewhere classified: Secondary | ICD-10-CM | POA: Diagnosis not present

## 2017-12-01 DIAGNOSIS — R600 Localized edema: Secondary | ICD-10-CM | POA: Diagnosis not present

## 2017-12-01 DIAGNOSIS — G8929 Other chronic pain: Secondary | ICD-10-CM

## 2017-12-01 DIAGNOSIS — M25662 Stiffness of left knee, not elsewhere classified: Secondary | ICD-10-CM

## 2017-12-01 DIAGNOSIS — M6281 Muscle weakness (generalized): Secondary | ICD-10-CM

## 2017-12-01 NOTE — Therapy (Signed)
[place], [place], [address] Phone: [phone]   Fax:  [phone]  Physical Therapy Treatment  Patient Details  Name: Jonathan Ochoa MRN: [id number] Date of Birth: 1950/08/01 Referring Provider: Mickel Crow, PA-C (Surgeon: [name], MD)   Encounter Date: 12/01/2017  PT End of Session - 12/01/17 1452    Visit Number  5    Number of Visits  16    Date for PT Re-Evaluation  01/01/18 Mini-reassess 12/11/17    Authorization Type  [employer] H[employer]    Authorization Time Period  11/20/17 to 01/01/18    Authorization - Visit Number  5    Authorization - Number of Visits  10    PT Start Time  0946    PT Stop Time  1030    PT Time Calculation (min)  44 min    Activity Tolerance  Patient tolerated treatment well;Patient limited by pain    Behavior During Therapy  [facility] for tasks assessed/performed       Past Medical History:  Diagnosis Date  . Arthritis   . Hypothyroidism   . Thyroid disease     Past Surgical History:  Procedure Laterality Date  . adhesions of abdomen     from scar tissue from appendectomy  . AMPUTATION OF REPLICATED TOES     partial tip amputation of left big toe, and next toe   . APPENDECTOMY  1977  . arthroscpy      left knee -meniscus tear  . STERIOD INJECTION Right 11/16/2017   Procedure: CORTISONE  INJECTION RIGHT KNEE;  Surgeon: [name], MD;  Location: [facility];  Service: Orthopedics;  Laterality: Right;  . TOE SURGERY    . TOTAL KNEE ARTHROPLASTY Left 11/16/2017   Procedure: LEFT TOTAL KNEE ARTHROPLASTY;  Surgeon: [name], MD;  Location: [facility];  Service: Orthopedics;  Laterality: Left;    There were no vitals filed for this visit.  Subjective Assessment - 12/01/17 0956    Subjective  Pt comes today using quad cane with overall improving upright posturing.  Pt  reports minimal discomfort at 1/10.    Currently in Pain?  Yes    Pain Score  1     Pain  Location  Knee    Pain Orientation  Left    Pain Descriptors / Indicators  Throbbing    Pain Type  Surgical pain                       [facility] Adult PT Treatment/Exercise - 12/01/17 0001      Ambulation/Gait   Ambulation Distance (Feet)  226 Feet    Assistive device  Small based quad cane    Gait Pattern  Step-through pattern;Decreased step length - right;Decreased stance time - left;Decreased stride length;Decreased dorsiflexion - left;Decreased hip/knee flexion - left;Trendelenburg;Antalgic;Trunk flexed;Left foot flat    Gait Comments  tendelenburg Lt and forward bent posturing, decreased knee extension      Knee/Hip Exercises: Stretches   Active Hamstring Stretch  Left;3 reps;30 seconds    Active Hamstring Stretch Limitations  supine    Knee: Self-Stretch to increase Flexion  10 seconds;Limitations    Knee: Self-Stretch Limitations  10 reps on 12" step    Gastroc Stretch  Both;3 reps;30 seconds;Limitations    Gastroc Stretch Limitations  slant board      Knee/Hip Exercises: Standing   Heel Raises  10 reps;Limitations    Heel Raises Limitations  toeraises 10 reps    Knee Flexion  Left;10 reps    Rocker Board  2 minutes;Limitations    Rocker Board Limitations  a/p and rt/lt with manual assist to isolate correct musculature    Gait Training  226" with quad cane working on cadence, technique and posturing    Other Standing Knee Exercises  hip hikes with 2" step 15 reps each      Knee/Hip Exercises: Supine   Quad Sets  Left;15 reps;Limitations    Short Arc Quad Sets  Left;15 reps    Short Arc Quad Sets Limitations  5" holds    Heel Slides  Left;15 reps    Knee Extension  AROM    Knee Extension Limitations  14 was 14 AROM, achieved 8 degrees PROM today    Knee Flexion  AROM    Knee Flexion Limitations  118 was 99 AROM               PT Short Term Goals - 11/20/17 1213      PT SHORT TERM GOAL #1   Title  Pt will be independent with HEP and perform  consistently in order to improve ROM and strength to promtoe return to PLOF.     Time  3    Period  Weeks    Status  New    Target Date  12/11/17      PT SHORT TERM GOAL #2   Title  Pt will have decreased L knee edema by 2cm or > at joint line in order to decrease pain and improve ROM.    Time  3    Period  Weeks    Status  New    Target Date  12/11/17      PT SHORT TERM GOAL #3   Title  Pt will have improved L knee AROM from 10-100deg in order to decrease pain and maximize gait.    Time  3    Period  Weeks    Status  New      PT SHORT TERM GOAL #4   Title  Pt will have 1/2 grade improvement in MMT throughout all in order to maximize balance, gait, and ability to perform functional tasks at home and in the community.    Time  3    Period  Weeks    Status  New        PT Long Term Goals - 11/20/17 1243      PT LONG TERM GOAL #1   Title  Pt will have improved L knee AROM from 5-115deg or better in order to further decrease pain and improve gait and stair ambulation.    Time  6    Period  Weeks    Status  New    Target Date  01/01/18      PT LONG TERM GOAL #2   Title  Pt will have 1 grade improvement in MMT throughout all muscle groups tested in order to further maximize STS transfers, stairs, and overall gait.     Time  6    Period  Weeks    Status  New      PT LONG TERM GOAL #3   Title  Pt will be able to perform 5xSTS in 12 sec or < with no UE and proper mechanics in order to demo improved overall functional strength.    Time  6    Period  Weeks    Status  New  PT LONG TERM GOAL #4   Title  Pt will be able to perform bil SLS for 10 sec or > with no UE in order to demo improved balance and to maximize gait and stair ambulation.    Time  6    Period  Weeks    Status  New      PT LONG TERM GOAL #5   Title  Pt will have improved 3MWT by 142ft or > with LRAD and gait mechanics WFL and without increases in L knee pain in order to maximzie pt's community access.     Time  6    Period  Weeks    Status  New            Plan - 12/01/17 1452    Clinical Impression Statement  Continued with focus on improving knee mobility and improving overall gait quality.  Pt has transitioned to [facility] at this point with overall safety and stability noted.  Cues to ambulate in more upright posturing and decrease trendelenburg gait.  Instructed wtih hip hiking to help increase glute strength/reduce trendelenburg.  Pt required manual facilitation to decrease body compensation with rockerboard in both directions.   Flexion with great gains, improving since initial evaluation from  88 to 118 degrees.  Extension has remained at 14 degrees from neutral.  Instructed patient to work more on extension and overall gait at home.      Rehab Potential  Good    PT Frequency  3x / week    PT Duration  6 weeks    PT Treatment/Interventions  ADLs/Self Care Home Management;Biofeedback;Cryotherapy;Electrical Stimulation;Moist Heat;Ultrasound;DME Instruction;Gait training;Stair training;Functional mobility training;Therapeutic activities;Therapeutic exercise;Balance training;Neuromuscular re-education;Patient/family education;Manual techniques;Scar mobilization;Passive range of motion;Dry needling;Energy conservation;Taping;Compression bandaging    PT Next Visit Plan  continue to focus on ROM (extension) and progress functional strength of Lt knee.  Await further orders from MD.    PT Home Exercise Plan  eval: heel slides, quad set, supine HS stretch       Patient will benefit from skilled therapeutic intervention in order to improve the following deficits and impairments:  Abnormal gait, Decreased activity tolerance, Decreased balance, Decreased endurance, Decreased mobility, Decreased range of motion, Decreased scar mobility, Decreased strength, Difficulty walking, Hypomobility, Increased edema, Increased fascial restricitons, Increased muscle spasms, Impaired flexibility, Improper body  mechanics, Pain  Visit Diagnosis: Stiffness of left knee, not elsewhere classified  Chronic pain of left knee  Localized edema  Muscle weakness (generalized)  Difficulty in walking, not elsewhere classified     Problem List Patient Active Problem List   Diagnosis Date Noted  . OA (osteoarthritis) of knee 11/16/2017   Teena Irani, PTA/CLT [phone]  Teena Irani 12/01/2017, 3:04 PM  [place] [address] [place], [place], [address] Phone: [phone]   Fax:  [phone]  Name: FACUNDO ALLEMAND MRN: [id number] Date of Birth: 1950-09-11

## 2017-12-02 ENCOUNTER — Encounter (HOSPITAL_COMMUNITY): Payer: Self-pay

## 2017-12-02 ENCOUNTER — Ambulatory Visit (HOSPITAL_COMMUNITY): Payer: Medicare HMO

## 2017-12-02 DIAGNOSIS — R600 Localized edema: Secondary | ICD-10-CM

## 2017-12-02 DIAGNOSIS — M25662 Stiffness of left knee, not elsewhere classified: Secondary | ICD-10-CM | POA: Diagnosis not present

## 2017-12-02 DIAGNOSIS — M25562 Pain in left knee: Secondary | ICD-10-CM | POA: Diagnosis not present

## 2017-12-02 DIAGNOSIS — G8929 Other chronic pain: Secondary | ICD-10-CM

## 2017-12-02 DIAGNOSIS — M6281 Muscle weakness (generalized): Secondary | ICD-10-CM

## 2017-12-02 DIAGNOSIS — R262 Difficulty in walking, not elsewhere classified: Secondary | ICD-10-CM | POA: Diagnosis not present

## 2017-12-02 NOTE — Therapy (Signed)
[place], [place], [address] Phone: [phone]   Fax:  [phone]  Physical Therapy Treatment  Patient Details  Name: Jonathan Ochoa MRN: [id number] Date of Birth: [birth year] Referring Provider: Mickel Crow, PA-C (Surgeon: [name], MD)   Encounter Date: 12/02/2017  PT End of Session - 12/02/17 1438    Visit Number  6    Number of Visits  16    Date for PT Re-Evaluation  01/01/18 Minireassess 12/11/17    Authorization Type  [employer] HMO    Authorization Time Period  11/20/17 to 01/01/18    Authorization - Visit Number  6    Authorization - Number of Visits  10    PT Start Time  2423    PT Stop Time  1513    PT Time Calculation (min)  40 min    Activity Tolerance  Patient tolerated treatment well    Behavior During Therapy  [facility] for tasks assessed/performed       Past Medical History:  Diagnosis Date  . Arthritis   . Hypothyroidism   . Thyroid disease     Past Surgical History:  Procedure Laterality Date  . adhesions of abdomen     from scar tissue from appendectomy  . AMPUTATION OF REPLICATED TOES     partial tip amputation of left big toe, and next toe   . APPENDECTOMY  1977  . arthroscpy      left knee -meniscus tear  . STERIOD INJECTION Right 11/16/2017   Procedure: CORTISONE  INJECTION RIGHT KNEE;  Surgeon: [name], MD;  Location: [facility];  Service: Orthopedics;  Laterality: Right;  . TOE SURGERY    . TOTAL KNEE ARTHROPLASTY Left 11/16/2017   Procedure: LEFT TOTAL KNEE ARTHROPLASTY;  Surgeon: [name], MD;  Location: [facility];  Service: Orthopedics;  Laterality: Left;    There were no vitals filed for this visit.  Subjective Assessment - 12/02/17 1425    Subjective  Pt stated he went to MD yesterday, arrived without dressing covering incisionm, steri strips still intact.  Reports increased soreness today, 2/10 and some stiffness.      Pertinent History  Lt TKA  11/16/17 [place]    Patient Stated Goals  get well    Currently in Pain?  Yes    Pain Score  2     Pain Location  Knee    Pain Orientation  Left    Pain Descriptors / Indicators  Sore;Tightness    Pain Type  Surgical pain    Pain Onset  1 to 4 weeks ago    Pain Frequency  Intermittent    Aggravating Factors   bending, trying to stand up    Pain Relieving Factors  rest    Effect of Pain on Daily Activities  increases         [facility] PT Assessment - 12/02/17 0001      Assessment   Medical Diagnosis  L TKA    Referring Provider  Mickel Crow, PA-C Surgeon: [name], MD    Onset Date/Surgical Date  11/16/17    Next MD Visit  3 weeks from 12/01/17    Prior Therapy  none                   [facility] Adult PT Treatment/Exercise - 12/02/17 0001      Ambulation/Gait   Ambulation Distance (Feet)  226 Feet    Assistive device  Small  based quad cane    Gait Pattern  Step-through pattern;Decreased step length - right;Decreased stance time - left;Decreased stride length;Decreased dorsiflexion - left;Decreased hip/knee flexion - left;Trendelenburg;Antalgic;Trunk flexed;Left foot flat    Gait Comments  tendelenburg Lt and forward bent posturing, decreased knee extension      Knee/Hip Exercises: Stretches   Active Hamstring Stretch  Left;3 reps;30 seconds    Active Hamstring Stretch Limitations  standing 12in step    Knee: Self-Stretch to increase Flexion  10 seconds;Limitations    Knee: Self-Stretch Limitations  10 reps on 12" step    Gastroc Stretch  Both;3 reps;30 seconds;Limitations    Gastroc Stretch Limitations  slant board      Knee/Hip Exercises: Supine   Quad Sets  Left;15 reps;Limitations    Short Arc [employer]  Left;15 reps    Short Arc Quad Sets Limitations  5"    Heel Slides  Left;15 reps    Knee Extension  AROM    Knee Extension Limitations  12 was 14    Knee Flexion  AROM    Knee Flexion Limitations  120 was 118    Other Supine Knee/Hip Exercises  Rt  knee extension at 12 degrees lacking      Manual Therapy   Manual Therapy  Edema management    Manual therapy comments  completed seperately from all other skilled interventions    Edema Management  retro massage with BLE elevated + ankle pumps    Soft tissue mobilization  to decrease adhesions and pain to posterior knee              PT Education - 12/02/17 1849    Education provided  Yes    Education Details  Pt educated on benefits of compression hose for edema control    Person(s) Educated  Patient    Methods  Explanation    Comprehension  Verbalized understanding       PT Short Term Goals - 11/20/17 1213      PT SHORT TERM GOAL #1   Title  Pt will be independent with HEP and perform consistently in order to improve ROM and strength to promtoe return to PLOF.     Time  3    Period  Weeks    Status  New    Target Date  12/11/17      PT SHORT TERM GOAL #2   Title  Pt will have decreased L knee edema by 2cm or > at joint line in order to decrease pain and improve ROM.    Time  3    Period  Weeks    Status  New    Target Date  12/11/17      PT SHORT TERM GOAL #3   Title  Pt will have improved L knee AROM from 10-100deg in order to decrease pain and maximize gait.    Time  3    Period  Weeks    Status  New      PT SHORT TERM GOAL #4   Title  Pt will have 1/2 grade improvement in MMT throughout all in order to maximize balance, gait, and ability to perform functional tasks at home and in the community.    Time  3    Period  Weeks    Status  New        PT Long Term Goals - 11/20/17 1243      PT LONG TERM GOAL #1   Title  Pt will  have improved L knee AROM from 5-115deg or better in order to further decrease pain and improve gait and stair ambulation.    Time  6    Period  Weeks    Status  New    Target Date  01/01/18      PT LONG TERM GOAL #2   Title  Pt will have 1 grade improvement in MMT throughout all muscle groups tested in order to further maximize  STS transfers, stairs, and overall gait.     Time  6    Period  Weeks    Status  New      PT LONG TERM GOAL #3   Title  Pt will be able to perform 5xSTS in 12 sec or < with no UE and proper mechanics in order to demo improved overall functional strength.    Time  6    Period  Weeks    Status  New      PT LONG TERM GOAL #4   Title  Pt will be able to perform bil SLS for 10 sec or > with no UE in order to demo improved balance and to maximize gait and stair ambulation.    Time  6    Period  Weeks    Status  New      PT LONG TERM GOAL #5   Title  Pt will have improved 3MWT by 132ft or > with LRAD and gait mechanics WFL and without increases in L knee pain in order to maximzie pt's community access.    Time  6    Period  Weeks    Status  New            Plan - 12/02/17 1452    Clinical Impression Statement  Pt presents with increased edema proximal knee this session.  Retrograde massage complete for edema control and educated on benefits of compression hose.  Pt currently wearing TED hose, stated to wear til this Sunday per MD.  Pt explained difference in TED hose and compression hose, pt uncertain if could tolerate compression hose at this stage, continue to educate benefits if continues to have excessive edema proximal knee.  Session focus on knee mobility and gait training to improve posture to reduce strain on anterior knee.  Noted Rt knee is lacking 12 degrees extension as well.  AROM improved 11-120 degrees (was 14-118 degrees last session.)  No reports of increased pain through session.      Rehab Potential  Good    PT Frequency  3x / week    PT Duration  6 weeks    PT Treatment/Interventions  ADLs/Self Care Home Management;Biofeedback;Cryotherapy;Electrical Stimulation;Moist Heat;Ultrasound;DME Instruction;Gait training;Stair training;Functional mobility training;Therapeutic activities;Therapeutic exercise;Balance training;Neuromuscular re-education;Patient/family  education;Manual techniques;Scar mobilization;Passive range of motion;Dry needling;Energy conservation;Taping;Compression bandaging    PT Next Visit Plan  continue to focus on ROM (extension) and progress functional strength of Lt knee.      PT Home Exercise Plan  eval: heel slides, quad set, supine HS stretch    Recommended Other Services  Compression hose if continue to have excessive edema present proximal knee       Patient will benefit from skilled therapeutic intervention in order to improve the following deficits and impairments:  Abnormal gait, Decreased activity tolerance, Decreased balance, Decreased endurance, Decreased mobility, Decreased range of motion, Decreased scar mobility, Decreased strength, Difficulty walking, Hypomobility, Increased edema, Increased fascial restricitons, Increased muscle spasms, Impaired flexibility, Improper body mechanics, Pain  Visit Diagnosis: Stiffness  of left knee, not elsewhere classified  Chronic pain of left knee  Localized edema  Muscle weakness (generalized)  Difficulty in walking, not elsewhere classified     Problem List Patient Active Problem List   Diagnosis Date Noted  . OA (osteoarthritis) of knee 11/16/2017   Ihor Austin, LPTA; CBIS [phone]  Aldona Lento 12/02/2017, 6:49 PM  [place] [address] [place], [place], [address] Phone: [phone]   Fax:  [phone]  Name: Jonathan Ochoa MRN: [id number] Date of Birth: 09-23-1950

## 2017-12-04 ENCOUNTER — Ambulatory Visit (HOSPITAL_COMMUNITY): Payer: Medicare HMO

## 2017-12-04 ENCOUNTER — Encounter (HOSPITAL_COMMUNITY): Payer: Self-pay

## 2017-12-04 DIAGNOSIS — M6281 Muscle weakness (generalized): Secondary | ICD-10-CM | POA: Diagnosis not present

## 2017-12-04 DIAGNOSIS — R600 Localized edema: Secondary | ICD-10-CM | POA: Diagnosis not present

## 2017-12-04 DIAGNOSIS — M25662 Stiffness of left knee, not elsewhere classified: Secondary | ICD-10-CM | POA: Diagnosis not present

## 2017-12-04 DIAGNOSIS — G8929 Other chronic pain: Secondary | ICD-10-CM

## 2017-12-04 DIAGNOSIS — R262 Difficulty in walking, not elsewhere classified: Secondary | ICD-10-CM | POA: Diagnosis not present

## 2017-12-04 DIAGNOSIS — M25562 Pain in left knee: Secondary | ICD-10-CM | POA: Diagnosis not present

## 2017-12-04 NOTE — Therapy (Signed)
[place], [place], [address] Phone: [phone]   Fax:  [phone]  Physical Therapy Treatment  Patient Details  Name: Jonathan Ochoa MRN: [id number] Date of Birth: 02/05/1951 Referring Provider: Mickel Crow, PA-C (Surgeon: [name], MD)   Encounter Date: 12/04/2017  PT End of Session - 12/04/17 1442    Visit Number  7    Number of Visits  16    Date for PT Re-Evaluation  01/01/18 [facility] 12/11/17    Authorization Type  [employer] HMO    Authorization Time Period  11/20/17 to 01/01/18    Authorization - Visit Number  7    Authorization - Number of Visits  10    PT Start Time  7124    PT Stop Time  1515    PT Time Calculation (min)  43 min    Equipment Utilized During Treatment  Gait belt    Activity Tolerance  Patient tolerated treatment well    Behavior During Therapy  [facility] for tasks assessed/performed       Past Medical History:  Diagnosis Date  . Arthritis   . Hypothyroidism   . Thyroid disease     Past Surgical History:  Procedure Laterality Date  . adhesions of abdomen     from scar tissue from appendectomy  . AMPUTATION OF REPLICATED TOES     partial tip amputation of left big toe, and next toe   . APPENDECTOMY  1977  . arthroscpy      left knee -meniscus tear  . STERIOD INJECTION Right 11/16/2017   Procedure: CORTISONE  INJECTION RIGHT KNEE;  Surgeon: [name], MD;  Location: [facility];  Service: Orthopedics;  Laterality: Right;  . TOE SURGERY    . TOTAL KNEE ARTHROPLASTY Left 11/16/2017   Procedure: LEFT TOTAL KNEE ARTHROPLASTY;  Surgeon: [name], MD;  Location: [facility];  Service: Orthopedics;  Laterality: Left;    There were no vitals filed for this visit.  Subjective Assessment - 12/04/17 1432    Subjective  Pt stated he is feeling good today, left his QC in car.  Reports the steri strips have started to fall off.  Reports has been applying ice for pain  and edema control.    Pertinent History  Lt TKA 11/16/17 [place]    Patient Stated Goals  get well    Currently in Pain?  No/denies         [facility] PT Assessment - 12/04/17 0001      Assessment   Medical Diagnosis  L TKA    Referring Provider  Mickel Crow, PA-C Surgeon: [name], MD    Onset Date/Surgical Date  11/16/17    Next MD Visit  12/22/17    Prior Therapy  none                   [facility] Adult PT Treatment/Exercise - 12/04/17 0001      Ambulation/Gait   Ambulation Distance (Feet)  226 Feet    Assistive device  None    Gait Pattern  Step-through pattern;Decreased step length - right;Decreased stance time - left;Decreased stride length;Decreased dorsiflexion - left;Decreased hip/knee flexion - left;Trendelenburg;Antalgic;Trunk flexed;Left foot flat    Gait Comments  tendelenburg Lt and forward bent posturing, decreased knee extension      Knee/Hip Exercises: Stretches   Active Hamstring Stretch  Left;3 reps;30 seconds    Active Hamstring Stretch Limitations  supine wiht rope    Hip  Flexor Stretch  2 reps;30 seconds;Limitations    Hip Flexor Stretch Limitations  standing 12in step with therapist assistance    Gastroc Stretch  Both;3 reps;30 seconds;Limitations    Gastroc Stretch Limitations  slant board      Knee/Hip Exercises: Standing   Terminal Knee Extension  10 reps;Left push ball into wall      Knee/Hip Exercises: Supine   Quad Sets  Both;15 reps    Quad Sets Limitations  in long sitting and supine     Short Arc [employer]  Both;15 reps    Short Arc Quad Sets Limitations  5"    Knee Extension  AROM    Knee Extension Limitations  10    Knee Flexion  AROM    Knee Flexion Limitations  118    Other Supine Knee/Hip Exercises  Rt knee extension at 12 degrees lacking      Knee/Hip Exercises: Prone   Prone Knee Hang  5 minutes    Prone Knee Hang Limitations  with manual to posterior knee      Manual Therapy   Manual Therapy  Edema management     Manual therapy comments  completed seperately from all other skilled interventions    Edema Management  retro massage with BLE elevated + ankle pumps    Soft tissue mobilization  to decrease adhesions and pain to posterior knee                PT Short Term Goals - 11/20/17 1213      PT SHORT TERM GOAL #1   Title  Pt will be independent with HEP and perform consistently in order to improve ROM and strength to promtoe return to PLOF.     Time  3    Period  Weeks    Status  New    Target Date  12/11/17      PT SHORT TERM GOAL #2   Title  Pt will have decreased L knee edema by 2cm or > at joint line in order to decrease pain and improve ROM.    Time  3    Period  Weeks    Status  New    Target Date  12/11/17      PT SHORT TERM GOAL #3   Title  Pt will have improved L knee AROM from 10-100deg in order to decrease pain and maximize gait.    Time  3    Period  Weeks    Status  New      PT SHORT TERM GOAL #4   Title  Pt will have 1/2 grade improvement in MMT throughout all in order to maximize balance, gait, and ability to perform functional tasks at home and in the community.    Time  3    Period  Weeks    Status  New        PT Long Term Goals - 11/20/17 1243      PT LONG TERM GOAL #1   Title  Pt will have improved L knee AROM from 5-115deg or better in order to further decrease pain and improve gait and stair ambulation.    Time  6    Period  Weeks    Status  New    Target Date  01/01/18      PT LONG TERM GOAL #2   Title  Pt will have 1 grade improvement in MMT throughout all muscle groups tested in order to further maximize STS  transfers, stairs, and overall gait.     Time  6    Period  Weeks    Status  New      PT LONG TERM GOAL #3   Title  Pt will be able to perform 5xSTS in 12 sec or < with no UE and proper mechanics in order to demo improved overall functional strength.    Time  6    Period  Weeks    Status  New      PT LONG TERM GOAL #4   Title   Pt will be able to perform bil SLS for 10 sec or > with no UE in order to demo improved balance and to maximize gait and stair ambulation.    Time  6    Period  Weeks    Status  New      PT LONG TERM GOAL #5   Title  Pt will have improved 3MWT by 190ft or > with LRAD and gait mechanics WFL and without increases in L knee pain in order to maximzie pt's community access.    Time  6    Period  Weeks    Status  New            Plan - 12/04/17 1518    Clinical Impression Statement  Pt arrived without AD, continues to ambulate with tredelenburg gait and side bends to Lt with Rt LE WB and forward flexed at hips.  Pt educated on importance of posture during gait.  Pt encouraged to return to AD to improve mechanics.  Added hip flexor stretches and continues wiht knee extension based exercises.  Continued to have edema present proximal knee and distal LE.  Manual retrograde massage complete for edema control prior ROM based exercises.  Added prone knee hang and standing TKE to assist with knee extension.  AROM 10-118 degrees at EOS.  Pt encouraged to complete quad sets Bil knees as both knees are lacking extension.  No reports of pain through session.      Rehab Potential  Good    PT Frequency  3x / week    PT Duration  6 weeks    PT Treatment/Interventions  ADLs/Self Care Home Management;Biofeedback;Cryotherapy;Electrical Stimulation;Moist Heat;Ultrasound;DME Instruction;Gait training;Stair training;Functional mobility training;Therapeutic activities;Therapeutic exercise;Balance training;Neuromuscular re-education;Patient/family education;Manual techniques;Scar mobilization;Passive range of motion;Dry needling;Energy conservation;Taping;Compression bandaging    PT Next Visit Plan  continue to focus on ROM (extension) and progress functional strength of Lt knee.      PT Home Exercise Plan  eval: heel slides, quad set, supine HS stretch    Recommended Other Services  Compression hose for edema control        Patient will benefit from skilled therapeutic intervention in order to improve the following deficits and impairments:  Abnormal gait, Decreased activity tolerance, Decreased balance, Decreased endurance, Decreased mobility, Decreased range of motion, Decreased scar mobility, Decreased strength, Difficulty walking, Hypomobility, Increased edema, Increased fascial restricitons, Increased muscle spasms, Impaired flexibility, Improper body mechanics, Pain  Visit Diagnosis: Stiffness of left knee, not elsewhere classified  Chronic pain of left knee  Localized edema  Muscle weakness (generalized)     Problem List Patient Active Problem List   Diagnosis Date Noted  . OA (osteoarthritis) of knee 11/16/2017   Ihor Austin, LPTA; CBIS [phone]  Aldona Lento 12/04/2017, 3:28 PM  [place] [address] [place], [place], [address] Phone: [phone]   Fax:  [phone]  Name: ALON MAZOR  MRN: [id number] Date of Birth: Jul 22, 1950

## 2017-12-07 ENCOUNTER — Ambulatory Visit (HOSPITAL_COMMUNITY): Payer: Medicare HMO | Attending: Orthopedic Surgery | Admitting: Physical Therapy

## 2017-12-07 DIAGNOSIS — G8929 Other chronic pain: Secondary | ICD-10-CM | POA: Diagnosis not present

## 2017-12-07 DIAGNOSIS — R600 Localized edema: Secondary | ICD-10-CM | POA: Diagnosis not present

## 2017-12-07 DIAGNOSIS — M25662 Stiffness of left knee, not elsewhere classified: Secondary | ICD-10-CM | POA: Insufficient documentation

## 2017-12-07 DIAGNOSIS — R262 Difficulty in walking, not elsewhere classified: Secondary | ICD-10-CM | POA: Insufficient documentation

## 2017-12-07 DIAGNOSIS — M25562 Pain in left knee: Secondary | ICD-10-CM | POA: Diagnosis not present

## 2017-12-07 DIAGNOSIS — M6281 Muscle weakness (generalized): Secondary | ICD-10-CM

## 2017-12-07 NOTE — Therapy (Signed)
[place], [place], [address] Phone: [phone]   Fax:  [phone]  Physical Therapy Treatment  Patient Details  Name: Jonathan Ochoa MRN: [id number] Date of Birth: [birth year] Referring Provider: Mickel Crow, PA-C (Surgeon: [name], MD)   Encounter Date: 12/07/2017  PT End of Session - 12/07/17 1545    Visit Number  8    Number of Visits  16    Date for PT Re-Evaluation  01/01/18 [facility]ess 12/11/17    Authorization Type  [employer] HMO    Authorization Time Period  11/20/17 to 01/01/18    Authorization - Visit Number  8    Authorization - Number of Visits  10    PT Start Time  4496    PT Stop Time  1516    PT Time Calculation (min)  43 min    Equipment Utilized During Treatment  Gait belt    Activity Tolerance  Patient tolerated treatment well    Behavior During Therapy  [facility] for tasks assessed/performed       Past Medical History:  Diagnosis Date  . Arthritis   . Hypothyroidism   . Thyroid disease     Past Surgical History:  Procedure Laterality Date  . adhesions of abdomen     from scar tissue from appendectomy  . AMPUTATION OF REPLICATED TOES     partial tip amputation of left big toe, and next toe   . APPENDECTOMY  1977  . arthroscpy      left knee -meniscus tear  . STERIOD INJECTION Right 11/16/2017   Procedure: CORTISONE  INJECTION RIGHT KNEE;  Surgeon: [name], MD;  Location: [facility];  Service: Orthopedics;  Laterality: Right;  . TOE SURGERY    . TOTAL KNEE ARTHROPLASTY Left 11/16/2017   Procedure: LEFT TOTAL KNEE ARTHROPLASTY;  Surgeon: [name], MD;  Location: [facility];  Service: Orthopedics;  Laterality: Left;    There were no vitals filed for this visit.  Subjective Assessment - 12/07/17 1440    Subjective  Pt comes today without AD.  States he feels the cane was doing more harm than good and the main issue is his Rt knee, which he willl address at his  next MD appt.  States he is not having any pain just tightness.     Currently in Pain?  No/denies                       [facility] Adult PT Treatment/Exercise - 12/07/17 0001      Ambulation/Gait   Gait Comments  trendelenburg Lt and forward bent posturing, decreased knee extension      Knee/Hip Exercises: Stretches   Active Hamstring Stretch  Left;3 reps;30 seconds    Active Hamstring Stretch Limitations  supine wiht rope    Hip Flexor Stretch  30 seconds;Limitations;3 reps    Hip Flexor Stretch Limitations  standing 7in step with therapist assistance    Gastroc Stretch  Both;3 reps;30 seconds;Limitations    Gastroc Stretch Limitations  slant board      Knee/Hip Exercises: Supine   Quad Sets  15 reps;Left    Short Arc Quad Sets  15 reps;Left    Heel Slides  Left;15 reps    Knee Extension  AROM    Knee Extension Limitations  5    Knee Flexion  AROM    Knee Flexion Limitations  118      Knee/Hip Exercises: Prone  Prone Knee Hang  5 minutes    Prone Knee Hang Limitations  with manual to posterior knee      Manual Therapy   Manual Therapy  Edema management    Manual therapy comments  completed seperately from all other skilled interventions    Edema Management  retro massage with BLE elevated + ankle pumps    Soft tissue mobilization  to decrease adhesions and pain to posterior knee                PT Short Term Goals - 11/20/17 1213      PT SHORT TERM GOAL #1   Title  Pt will be independent with HEP and perform consistently in order to improve ROM and strength to promtoe return to PLOF.     Time  3    Period  Weeks    Status  New    Target Date  12/11/17      PT SHORT TERM GOAL #2   Title  Pt will have decreased L knee edema by 2cm or > at joint line in order to decrease pain and improve ROM.    Time  3    Period  Weeks    Status  New    Target Date  12/11/17      PT SHORT TERM GOAL #3   Title  Pt will have improved L knee AROM from 10-100deg in  order to decrease pain and maximize gait.    Time  3    Period  Weeks    Status  New      PT SHORT TERM GOAL #4   Title  Pt will have 1/2 grade improvement in MMT throughout all in order to maximize balance, gait, and ability to perform functional tasks at home and in the community.    Time  3    Period  Weeks    Status  New        PT Long Term Goals - 11/20/17 1243      PT LONG TERM GOAL #1   Title  Pt will have improved L knee AROM from 5-115deg or better in order to further decrease pain and improve gait and stair ambulation.    Time  6    Period  Weeks    Status  New    Target Date  01/01/18      PT LONG TERM GOAL #2   Title  Pt will have 1 grade improvement in MMT throughout all muscle groups tested in order to further maximize STS transfers, stairs, and overall gait.     Time  6    Period  Weeks    Status  New      PT LONG TERM GOAL #3   Title  Pt will be able to perform 5xSTS in 12 sec or < with no UE and proper mechanics in order to demo improved overall functional strength.    Time  6    Period  Weeks    Status  New      PT LONG TERM GOAL #4   Title  Pt will be able to perform bil SLS for 10 sec or > with no UE in order to demo improved balance and to maximize gait and stair ambulation.    Time  6    Period  Weeks    Status  New      PT LONG TERM GOAL #5   Title  Pt will have improved  3MWT by 165ft or > with LRAD and gait mechanics WFL and without increases in L knee pain in order to maximzie pt's community access.    Time  6    Period  Weeks    Status  New            Plan - 12/07/17 1546    Clinical Impression Statement  Continued focus on gait to decrease trendelenburg and ROM of LT knee.  Manual completed in prone and supine with noted improvement in ROM following for extension to 5 degrees (was 10 last session).  Flexion remains the same at 118 degrees.  Pt continues to have edema at proximal knee and tightness along scar line.  No return of pain in  Lt knee, however Rt knee continues to bother him and reports he feels this is his biggest issue at this point.     Rehab Potential  Good    PT Frequency  3x / week    PT Duration  6 weeks    PT Treatment/Interventions  ADLs/Self Care Home Management;Biofeedback;Cryotherapy;Electrical Stimulation;Moist Heat;Ultrasound;DME Instruction;Gait training;Stair training;Functional mobility training;Therapeutic activities;Therapeutic exercise;Balance training;Neuromuscular re-education;Patient/family education;Manual techniques;Scar mobilization;Passive range of motion;Dry needling;Energy conservation;Taping;Compression bandaging    PT Next Visit Plan  continue to focus on ROM (extension) and progress functional strength of Lt knee.  Next session begin step ups and lunges    PT Home Exercise Plan  eval: heel slides, quad set, supine HS stretch       Patient will benefit from skilled therapeutic intervention in order to improve the following deficits and impairments:  Abnormal gait, Decreased activity tolerance, Decreased balance, Decreased endurance, Decreased mobility, Decreased range of motion, Decreased scar mobility, Decreased strength, Difficulty walking, Hypomobility, Increased edema, Increased fascial restricitons, Increased muscle spasms, Impaired flexibility, Improper body mechanics, Pain  Visit Diagnosis: Stiffness of left knee, not elsewhere classified  Chronic pain of left knee  Localized edema  Difficulty in walking, not elsewhere classified  Muscle weakness (generalized)     Problem List Patient Active Problem List   Diagnosis Date Noted  . OA (osteoarthritis) of knee 11/16/2017   Teena Irani, PTA/CLT [phone]  Teena Irani 12/07/2017, 3:49 PM  [place] [address] [place], [place], [address] Phone: [phone]   Fax:  [phone]  Name: REGNALD BOWENS MRN: [id number] Date of Birth: 11/05/1950

## 2017-12-09 ENCOUNTER — Ambulatory Visit (HOSPITAL_COMMUNITY): Payer: Medicare HMO

## 2017-12-09 ENCOUNTER — Encounter (HOSPITAL_COMMUNITY): Payer: Self-pay

## 2017-12-09 DIAGNOSIS — M25662 Stiffness of left knee, not elsewhere classified: Secondary | ICD-10-CM | POA: Diagnosis not present

## 2017-12-09 DIAGNOSIS — M6281 Muscle weakness (generalized): Secondary | ICD-10-CM

## 2017-12-09 DIAGNOSIS — G8929 Other chronic pain: Secondary | ICD-10-CM | POA: Diagnosis not present

## 2017-12-09 DIAGNOSIS — R262 Difficulty in walking, not elsewhere classified: Secondary | ICD-10-CM

## 2017-12-09 DIAGNOSIS — M25562 Pain in left knee: Secondary | ICD-10-CM

## 2017-12-09 DIAGNOSIS — R600 Localized edema: Secondary | ICD-10-CM

## 2017-12-09 NOTE — Patient Instructions (Signed)
Abduction: Side Leg Lift (Eccentric) - Side-Lying    Lie on side. Lift top leg slightly higher than shoulder level. Keep top leg straight with body, toes pointing forward. Slowly lower for 3-5 seconds. 10-20 reps per set, 2 sets per day, 4 days per week.  http://ecce.exer.us/62   Copyright  VHI. All rights reserved.

## 2017-12-09 NOTE — Therapy (Signed)
[place], [place], [address] Phone: [phone]   Fax:  [phone]  Physical Therapy Treatment  Patient Details  Name: Jonathan Ochoa MRN: [id number] Date of Birth: Aug 09, 1950 Referring Provider: Mickel Crow, PA-C (Surgeon: [name], MD)   Encounter Date: 12/09/2017  PT End of Session - 12/09/17 1438    Visit Number  9    Number of Visits  16    Date for PT Re-Evaluation  01/01/18 minireassess 12/11/17    Authorization Type  [employer] HMO    Authorization Time Period  11/20/17 to 01/01/18    Authorization - Visit Number  9    Authorization - Number of Visits  10    PT Start Time  7425    PT Stop Time  1516    PT Time Calculation (min)  45 min    Activity Tolerance  Patient tolerated treatment well    Behavior During Therapy  [facility] for tasks assessed/performed       Past Medical History:  Diagnosis Date  . Arthritis   . Hypothyroidism   . Thyroid disease     Past Surgical History:  Procedure Laterality Date  . adhesions of abdomen     from scar tissue from appendectomy  . AMPUTATION OF REPLICATED TOES     partial tip amputation of left big toe, and next toe   . APPENDECTOMY  1977  . arthroscpy      left knee -meniscus tear  . STERIOD INJECTION Right 11/16/2017   Procedure: CORTISONE  INJECTION RIGHT KNEE;  Surgeon: [name], MD;  Location: [facility];  Service: Orthopedics;  Laterality: Right;  . TOE SURGERY    . TOTAL KNEE ARTHROPLASTY Left 11/16/2017   Procedure: LEFT TOTAL KNEE ARTHROPLASTY;  Surgeon: [name], MD;  Location: [facility];  Service: Orthopedics;  Laterality: Left;    There were no vitals filed for this visit.  Subjective Assessment - 12/09/17 1432    Subjective  Pt arrived without AD.  Stated the Lt knee is stiff following sitting for 10-15 minutes.  Reports the Lt knee feels strong and no problem, continues to have issues with his Rt knee.      Pertinent  History  Lt TKA 11/16/17 [place]    Patient Stated Goals  get well    Currently in Pain?  No/denies Lt knee stiff, no pain    Pain Score  2  Rt knee 2/10 with weight bearing    Pain Location  Knee    Pain Orientation  Right;Left    Pain Descriptors / Indicators  Tightness Stiffness     Pain Onset  1 to 4 weeks ago    Pain Frequency  Intermittent    Aggravating Factors   bending, trying to stand up    Pain Relieving Factors  rest    Effect of Pain on Daily Activities  increases                       [facility] Adult PT Treatment/Exercise - 12/09/17 0001      Ambulation/Gait   Gait Comments  trendelenburg Lt and forward bent posturing, decreased knee extension      Knee/Hip Exercises: Stretches   Active Hamstring Stretch  Left;3 reps;30 seconds    Active Hamstring Stretch Limitations  supine wiht rope    Hip Flexor Stretch  30 seconds;Limitations;3 reps    Hip Flexor Stretch Limitations  standing 7in step  with therapist assistance    Gastroc Stretch  Both;3 reps;30 seconds;Limitations    Gastroc Stretch Limitations  slant board      Knee/Hip Exercises: Standing   Forward Lunges  Both;10 reps;Limitations    Forward Lunges Limitations  6in step    Lateral Step Up  Left;10 reps;Hand Hold: 2;Step Height: 4"    Forward Step Up  Both;10 reps;Hand Hold: 1;Step Height: 4"      Knee/Hip Exercises: Supine   Short Arc Quad Sets  20 reps    Short Arc Quad Sets Limitations  5" holds    Theraband Level (Terminal Knee Extension)  Other (comment) attempted    Knee Extension  AROM    Knee Extension Limitations  5    Knee Flexion  AROM    Knee Flexion Limitations  118      Knee/Hip Exercises: Sidelying   Hip ABduction  Both;10 reps      Knee/Hip Exercises: Prone   Prone Knee Hang  5 minutes    Prone Knee Hang Limitations  with manual to posterior knee, 5#      Manual Therapy   Manual Therapy  Edema management;Soft tissue mobilization    Manual therapy comments  completed  seperately from all other skilled interventions    Edema Management  retro massage with BLE elevated + ankle pumps    Soft tissue mobilization  to decrease adhesions and pain to posterior knee                PT Short Term Goals - 11/20/17 1213      PT SHORT TERM GOAL #1   Title  Pt will be independent with HEP and perform consistently in order to improve ROM and strength to promtoe return to PLOF.     Time  3    Period  Weeks    Status  New    Target Date  12/11/17      PT SHORT TERM GOAL #2   Title  Pt will have decreased L knee edema by 2cm or > at joint line in order to decrease pain and improve ROM.    Time  3    Period  Weeks    Status  New    Target Date  12/11/17      PT SHORT TERM GOAL #3   Title  Pt will have improved L knee AROM from 10-100deg in order to decrease pain and maximize gait.    Time  3    Period  Weeks    Status  New      PT SHORT TERM GOAL #4   Title  Pt will have 1/2 grade improvement in MMT throughout all in order to maximize balance, gait, and ability to perform functional tasks at home and in the community.    Time  3    Period  Weeks    Status  New        PT Long Term Goals - 11/20/17 1243      PT LONG TERM GOAL #1   Title  Pt will have improved L knee AROM from 5-115deg or better in order to further decrease pain and improve gait and stair ambulation.    Time  6    Period  Weeks    Status  New    Target Date  01/01/18      PT LONG TERM GOAL #2   Title  Pt will have 1 grade improvement in MMT throughout all muscle  groups tested in order to further maximize STS transfers, stairs, and overall gait.     Time  6    Period  Weeks    Status  New      PT LONG TERM GOAL #3   Title  Pt will be able to perform 5xSTS in 12 sec or < with no UE and proper mechanics in order to demo improved overall functional strength.    Time  6    Period  Weeks    Status  New      PT LONG TERM GOAL #4   Title  Pt will be able to perform bil SLS for  10 sec or > with no UE in order to demo improved balance and to maximize gait and stair ambulation.    Time  6    Period  Weeks    Status  New      PT LONG TERM GOAL #5   Title  Pt will have improved 3MWT by 129ft or > with LRAD and gait mechanics WFL and without increases in L knee pain in order to maximzie pt's community access.    Time  6    Period  Weeks    Status  New            Plan - 12/09/17 1453    Clinical Impression Statement  Continued with knee mobilty, gait training and progressed functional strengthening this session.  Manual complete in prone to assist with extension and supine to assist with edema and myofascial tissue to reduce restrictions along scar line.  Noted pt has close to 20 small bug bites posterior thigh, pt unaware of them thought it was one bug bite.  Pt making good gains with knee mobilitiy with AROM 5-118.  Added glut med strengthening to improve gait mechanics.  Pt continues to ambulate trendelenburg gait mechanics, states it's due to Rt knee issues, plans with MD later this month to look at Rt knee.  Progressed to functional strengthening with additional step up and lunges exercises.  No reports of pain through session.      Rehab Potential  Good    PT Frequency  3x / week    PT Duration  6 weeks    PT Treatment/Interventions  ADLs/Self Care Home Management;Biofeedback;Cryotherapy;Electrical Stimulation;Moist Heat;Ultrasound;DME Instruction;Gait training;Stair training;Functional mobility training;Therapeutic activities;Therapeutic exercise;Balance training;Neuromuscular re-education;Patient/family education;Manual techniques;Scar mobilization;Passive range of motion;Dry needling;Energy conservation;Taping;Compression bandaging    PT Next Visit Plan  continue to focus on ROM (extension) and progress functional strength of Lt knee.  Add SLS and sidestep next session.      PT Home Exercise Plan  eval: heel slides, quad set, supine HS stretch; sidelying abd        Patient will benefit from skilled therapeutic intervention in order to improve the following deficits and impairments:  Abnormal gait, Decreased activity tolerance, Decreased balance, Decreased endurance, Decreased mobility, Decreased range of motion, Decreased scar mobility, Decreased strength, Difficulty walking, Hypomobility, Increased edema, Increased fascial restricitons, Increased muscle spasms, Impaired flexibility, Improper body mechanics, Pain  Visit Diagnosis: Stiffness of left knee, not elsewhere classified  Chronic pain of left knee  Localized edema  Difficulty in walking, not elsewhere classified  Muscle weakness (generalized)     Problem List Patient Active Problem List   Diagnosis Date Noted  . OA (osteoarthritis) of knee 11/16/2017   Ihor Austin, LPTA; CBIS [phone]  Aldona Lento 12/09/2017, 4:06 PM  [facility]  [place], [place], [address] Phone: [phone]   Fax:  [phone]  Name: ODIE EDMONDS MRN: [id number] Date of Birth: [birth year]

## 2017-12-11 ENCOUNTER — Ambulatory Visit (HOSPITAL_COMMUNITY): Payer: Medicare HMO | Admitting: Physical Therapy

## 2017-12-11 DIAGNOSIS — M6281 Muscle weakness (generalized): Secondary | ICD-10-CM

## 2017-12-11 DIAGNOSIS — M25662 Stiffness of left knee, not elsewhere classified: Secondary | ICD-10-CM | POA: Diagnosis not present

## 2017-12-11 DIAGNOSIS — M25562 Pain in left knee: Secondary | ICD-10-CM

## 2017-12-11 DIAGNOSIS — G8929 Other chronic pain: Secondary | ICD-10-CM

## 2017-12-11 DIAGNOSIS — R600 Localized edema: Secondary | ICD-10-CM

## 2017-12-11 DIAGNOSIS — R262 Difficulty in walking, not elsewhere classified: Secondary | ICD-10-CM

## 2017-12-11 NOTE — Therapy (Signed)
[place], [place], [address] Phone: [phone]   Fax:  [phone]  Physical Therapy Treatment / Re-assessment  Patient Details  Name: Jonathan Ochoa MRN: [id number] Date of Birth: May 26, 1951 Referring Provider: Mickel Crow, PA-C (Surgeon [name])   Encounter Date: 12/11/2017   Progress Note Reporting Period 11/20/17 to 12/11/17  See note below for Objective Data and Assessment of Progress/Goals.       PT End of Session - 12/11/17 1525    Visit Number  10    Number of Visits  16    Date for PT Re-Evaluation  01/01/18 minireassess 12/11/17    Authorization Type  [employer] HMO    Authorization Time Period  11/20/17 to 01/01/18    Authorization - Visit Number  0    Authorization - Number of Visits  10    PT Start Time  1749    PT Stop Time  1515    PT Time Calculation (min)  46 min    Activity Tolerance  Patient tolerated treatment well    Behavior During Therapy  WFL for tasks assessed/performed       Past Medical History:  Diagnosis Date  . Arthritis   . Hypothyroidism   . Thyroid disease     Past Surgical History:  Procedure Laterality Date  . adhesions of abdomen     from scar tissue from appendectomy  . AMPUTATION OF REPLICATED TOES     partial tip amputation of left big toe, and next toe   . APPENDECTOMY  1977  . arthroscpy      left knee -meniscus tear  . STERIOD INJECTION Right 11/16/2017   Procedure: CORTISONE  INJECTION RIGHT KNEE;  Surgeon: [name], MD;  Location: [facility];  Service: Orthopedics;  Laterality: Right;  . TOE SURGERY    . TOTAL KNEE ARTHROPLASTY Left 11/16/2017   Procedure: LEFT TOTAL KNEE ARTHROPLASTY;  Surgeon: [name], MD;  Location: [facility];  Service: Orthopedics;  Laterality: Left;    There were no vitals filed for this visit.  Subjective Assessment - 12/11/17 1434    Subjective  Pt arrived without AD. Left knee has no pain, but right  knee has been a little sore.     Pertinent History  Lt TKA 11/16/17 [place]    How long can you sit comfortably?  2 hours (was 30-60 minutes)    How long can you stand comfortably?  1 hour (was <5 minutes)    How long can you walk comfortably?  unsure    Patient Stated Goals  get well    Currently in Pain?  Yes    Pain Score  2     Pain Location  Knee    Pain Orientation  Right    Pain Descriptors / Indicators  Sore         [facility] PT Assessment - 12/11/17 0001      Assessment   Medical Diagnosis  L TKA    Referring Provider  Mickel Crow, PA-C Surgeon [name]    Next MD Visit  12/22/17      Prior Function   Level of Independence  Independent      Cognition   Overall Cognitive Status  Within Functional Limits for tasks assessed      Observation/Other Assessments   Focus on Therapeutic Outcomes (FOTO)   48% (52% limited)      Circumferential Edema   Circumferential - Right  42 cm at joint line    Circumferential - Left   47 cm at joint line      AROM   Left Knee Extension  5    Left Knee Flexion  115      Strength   Right Hip Flexion  4+/5 was 4+/5    Right Hip Extension  3/5 was 2+/5    Right Hip ABduction  4/5 was 4-/5    Left Hip Flexion  4/5 was 3-/5    Left Hip Extension  4/5 was 3-/5    Left Hip ABduction  4-/5 was 3-/5    Right Knee Flexion  5/5    Right Knee Extension  5/5    Left Knee Flexion  4+/5 was 3/5    Left Knee Extension  4/5 was 3-/5    Right Ankle Dorsiflexion  5/5    Left Ankle Dorsiflexion  5/5      Ambulation/Gait   Ambulation Distance (Feet)  653 Feet 3MWT    Assistive device  None    Gait Pattern  Step-through pattern;Decreased step length - right;Decreased stance time - left;Decreased stride length;Decreased dorsiflexion - left;Decreased hip/knee flexion - left;Trendelenburg;Antalgic;Trunk flexed      Static Standing Balance   Static Standing - Balance Support  No upper extremity supported    Static Standing Balance -   Activities   Single Leg Stance - Right Leg;Single Leg Stance - Left Leg    Static Standing - Comment/# of Minutes  SLS Rt: 7.35 seconds, SLS Lt: >1 minute      Standardized Balance Assessment   Five times sit to stand comments   13 seconds, from standard chair, no upper extremities                   [facility] Adult PT Treatment/Exercise - 12/11/17 0001      Knee/Hip Exercises: Stretches   Passive Hamstring Stretch  Left;30 seconds;3 reps;Limitations    Passive Hamstring Stretch Limitations  On 12 inch step    Knee: Self-Stretch to increase Flexion  10 seconds;Limitations;Other (comment) 10 repetitions onto 12 inch step    Gastroc Stretch  Both;3 reps;30 seconds;Limitations    Gastroc Stretch Limitations  slant board      Knee/Hip Exercises: Standing   Forward Lunges  Both;10 reps;Limitations    Forward Lunges Limitations  6in step    Lateral Step Up  Left;10 reps;Step Height: 4";Hand Hold: 1;Step Height: 6"    Forward Step Up  Both;10 reps;Hand Hold: 1;Step Height: 6";Hand Hold: 0      Knee/Hip Exercises: Seated   Long Arc Quad  Left;15 reps 5'' holds      Knee/Hip Exercises: Supine   Short Arc [employer]  20 reps    Short Arc Quad Sets Limitations  5'' holds over a basketball    Knee Extension  AROM    Knee Extension Limitations  5    Knee Flexion  AROM    Knee Flexion Limitations  115      Knee/Hip Exercises: Sidelying   Hip ABduction  Both;15 reps             PT Education - 12/11/17 1524    Education provided  Yes    Education Details  Educated patient on the benefits of a compression garment for edema control, educated on re-assessment results.     Person(s) Educated  Patient    Methods  Explanation    Comprehension  Verbalized understanding  PT Short Term Goals - 12/11/17 1535      PT SHORT TERM GOAL #1   Title  Pt will be independent with HEP and perform consistently in order to improve ROM and strength to promtoe return to PLOF.     Baseline   12/11/17: Patient has been educated on initial HEP, continue to update as needed.     Time  3    Period  Weeks    Status  On-going      PT SHORT TERM GOAL #2   Title  Pt will have decreased L knee edema by 2cm or > at joint line in order to decrease pain and improve ROM.    Baseline  12/11/17: Patient did not demonstrate an improvement of 2 cm with edema from evaluation.     Time  3    Period  Weeks    Status  On-going      PT SHORT TERM GOAL #3   Title  Pt will have improved L knee AROM from 10-100deg in order to decrease pain and maximize gait.    Baseline  12/11/17: Patient demontrated 5-115 degrees AROM of the left knee.     Time  3    Period  Weeks    Status  Achieved      PT SHORT TERM GOAL #4   Title  Pt will have 1/2 grade improvement in MMT throughout all in order to maximize balance, gait, and ability to perform functional tasks at home and in the community.    Baseline  12/11/17: Patient demonstrated improved strength of 1/2 grade in some, but not all musculature. See MMT.     Time  3    Period  Weeks    Status  On-going        PT Long Term Goals - 12/11/17 1538      PT LONG TERM GOAL #1   Title  Pt will have improved L knee AROM from 5-115deg or better in order to further decrease pain and improve gait and stair ambulation.    Baseline  12/11/17: Patient demonstrated L knee AROM from 5-115 degrees.     Time  6    Period  Weeks    Status  Achieved      PT LONG TERM GOAL #2   Title  Pt will have 1 grade improvement in MMT throughout all muscle groups tested in order to further maximize STS transfers, stairs, and overall gait.     Baseline  12/11/17: Patient improved by 1 grade MMT in some, but not all muscle groups. See MMT.     Time  6    Period  Weeks    Status  On-going      PT LONG TERM GOAL #3   Title  Pt will be able to perform 5xSTS in 12 sec or < with no UE and proper mechanics in order to demo improved overall functional strength.    Baseline  12/11/17: Patient  performed 5 x STS in 13 seconds.     Time  6    Period  Weeks    Status  On-going      PT LONG TERM GOAL #4   Title  Pt will be able to perform bil SLS for 10 sec or > with no UE in order to demo improved balance and to maximize gait and stair ambulation.    Baseline  12/11/17: Patient demonstrated ability to maintain SLS on the left lower extremity for >  1 minute, but for 7.35 seconds on the right lower extremity.     Time  6    Period  Weeks    Status  Partially Met      PT LONG TERM GOAL #5   Title  Pt will have improved 3MWT by 15f or > with LRAD and gait mechanics WFL and without increases in L knee pain in order to maximzie pt's community access.    Baseline  12/11/17: Patient improved distance ambulated by greater than 100 feet however patient demonstrated deviations with gait mechanics.     Time  6    Period  Weeks    Status  Partially Met      Additional Long Term Goals   Additional Long Term Goals  Yes      PT LONG TERM GOAL #6   Title  Pt will have improved L knee AROM from 5-120deg or better in order to further decrease pain and improve gait and stair ambulation.    Time  3    Period  Weeks    Status  New    Target Date  01/01/18            Plan - 12/11/17 1550    Clinical Impression Statement  This session performed a re-assessment of patient's progress toward goals. Patient achieved 1 short term goal. Patient achieved 1 out of 5 of existing long term goals and partially met 2 out of 5 of his existing long term goals. Patient demonstrated left knee AROM from 5-115 degrees this session and therefore the ROM goal was updated to 5-120 degrees to continue progressing patient with ROM. The remainder of the session patient performed therapeutic exercises to improve lower extremity strength and knee ROM, patient. This session increased step height to 6 inches with step ups. Patient was educated about the purpose and benefits of a compression garment, however patient refused  the compression garment at this time. Patient had demonstrated good progress with therapy and would benefit from additional therapy in order to continue addressing patient's deficits in ROM, strength, balance, and overall functional mobility.     Rehab Potential  Good    PT Frequency  3x / week    PT Duration  6 weeks    PT Treatment/Interventions  ADLs/Self Care Home Management;Biofeedback;Cryotherapy;Electrical Stimulation;Moist Heat;Ultrasound;DME Instruction;Gait training;Stair training;Functional mobility training;Therapeutic activities;Therapeutic exercise;Balance training;Neuromuscular re-education;Patient/family education;Manual techniques;Scar mobilization;Passive range of motion;Dry needling;Energy conservation;Taping;Compression bandaging    PT Next Visit Plan  continue to focus on ROM and progress functional strength of Lt knee.  Add SLS and sidestep next session.      PT Home Exercise Plan  eval: heel slides, quad set, supine HS stretch; sidelying abd    Consulted and Agree with Plan of Care  Patient       Patient will benefit from skilled therapeutic intervention in order to improve the following deficits and impairments:  Abnormal gait, Decreased activity tolerance, Decreased balance, Decreased endurance, Decreased mobility, Decreased range of motion, Decreased scar mobility, Decreased strength, Difficulty walking, Hypomobility, Increased edema, Increased fascial restricitons, Increased muscle spasms, Impaired flexibility, Improper body mechanics, Pain  Visit Diagnosis: Stiffness of left knee, not elsewhere classified  Chronic pain of left knee  Localized edema  Difficulty in walking, not elsewhere classified  Muscle weakness (generalized)     Problem List Patient Active Problem List   Diagnosis Date Noted  . OA (osteoarthritis) of knee 11/16/2017   MClarene CritchleyPT, DPT 3:56 PM, 12/11/17 [phone] [facility]  [place], [place], [address] Phone: [phone]   Fax:  [phone]  Name: Jonathan Ochoa MRN: [id number] Date of Birth: 05-18-1951

## 2017-12-14 ENCOUNTER — Ambulatory Visit (HOSPITAL_COMMUNITY): Payer: Medicare HMO

## 2017-12-14 DIAGNOSIS — G8929 Other chronic pain: Secondary | ICD-10-CM | POA: Diagnosis not present

## 2017-12-14 DIAGNOSIS — M25662 Stiffness of left knee, not elsewhere classified: Secondary | ICD-10-CM | POA: Diagnosis not present

## 2017-12-14 DIAGNOSIS — M6281 Muscle weakness (generalized): Secondary | ICD-10-CM

## 2017-12-14 DIAGNOSIS — R262 Difficulty in walking, not elsewhere classified: Secondary | ICD-10-CM

## 2017-12-14 DIAGNOSIS — R600 Localized edema: Secondary | ICD-10-CM

## 2017-12-14 DIAGNOSIS — M25562 Pain in left knee: Secondary | ICD-10-CM | POA: Diagnosis not present

## 2017-12-14 NOTE — Therapy (Signed)
[place], [place], [address] Phone: [phone]   Fax:  [phone]  Physical Therapy Treatment  Patient Details  Name: Jonathan Ochoa MRN: [id number] Date of Birth: 12/10/1950 Referring Provider: Mickel Crow, PA-C (Surgeon [name])   Encounter Date: 12/14/2017  PT End of Session - 12/14/17 1433    Visit Number  11    Number of Visits  19 Updated to match plan    Date for PT Re-Evaluation  01/01/18 minireassess 12/11/17    Authorization Type  [employer] HMO    Authorization Time Period  11/20/17 to 01/01/18    Authorization - Visit Number  1    Authorization - Number of Visits  10    PT Start Time  6389    PT Stop Time  1514    PT Time Calculation (min)  43 min    Activity Tolerance  Patient tolerated treatment well    Behavior During Therapy  [facility] for tasks assessed/performed       Past Medical History:  Diagnosis Date  . Arthritis   . Hypothyroidism   . Thyroid disease     Past Surgical History:  Procedure Laterality Date  . adhesions of abdomen     from scar tissue from appendectomy  . AMPUTATION OF REPLICATED TOES     partial tip amputation of left big toe, and next toe   . APPENDECTOMY  1977  . arthroscpy      left knee -meniscus tear  . STERIOD INJECTION Right 11/16/2017   Procedure: CORTISONE  INJECTION RIGHT KNEE;  Surgeon: [name], MD;  Location: [facility];  Service: Orthopedics;  Laterality: Right;  . TOE SURGERY    . TOTAL KNEE ARTHROPLASTY Left 11/16/2017   Procedure: LEFT TOTAL KNEE ARTHROPLASTY;  Surgeon: [name], MD;  Location: [facility];  Service: Orthopedics;  Laterality: Left;    There were no vitals filed for this visit.  Subjective Assessment - 12/14/17 1530    Subjective  Pt reports he's doing well. His L knee is feeling great, it's mainly his R knee that is limiting him now.     Pertinent History  Lt TKA 11/16/17 [place]    How long can you sit  comfortably?  2 hours (was 30-60 minutes)    How long can you stand comfortably?  1 hour (was <5 minutes)    How long can you walk comfortably?  unsure    Patient Stated Goals  get well    Currently in Pain?  Yes    Pain Score  2     Pain Location  Knee    Pain Orientation  Right    Pain Descriptors / Indicators  Sore    Pain Type  Chronic pain    Pain Onset  More than a month ago    Pain Frequency  Intermittent    Aggravating Factors   bending, trying to stand up    Pain Relieving Factors  rest    Effect of Pain on Daily Activities  increases         [facility] PT Assessment - 12/14/17 0001      Circumferential Edema   Circumferential - Left   45cm joint line           [facility] Adult PT Treatment/Exercise - 12/14/17 0001      Knee/Hip Exercises: Stretches   Passive Hamstring Stretch  Left;30 seconds;3 reps;Limitations    Passive Hamstring Stretch Limitations  On 12 inch step    Gastroc Stretch  Both;3 reps;30 seconds;Limitations    Gastroc Stretch Limitations  slant board      Knee/Hip Exercises: Aerobic   Stationary Bike  x3 mins, full revolutions (unbilled)      Knee/Hip Exercises: Standing   Heel Raises  Both;20 reps    Heel Raises Limitations  heel and toe    Terminal Knee Extension Limitations  LLE, 10x10" BTB    Lateral Step Up  Left;15 reps;Step Height: 6"    Forward Step Up  Left;15 reps;Hand Hold: 0;Step Height: 6"    Step Down  Left;15 reps;Hand Hold: 2;Step Height: 6"    Functional Squat  15 reps;Limitations    Functional Squat Limitations  cues for form, chair behind for form      Knee/Hip Exercises: Seated   Long Arc Quad  Left;15 reps;Limitations    Long Arc Quad Limitations  3#, 3-5" holds      Knee/Hip Exercises: Supine   Short Arc [employer]  Left;15 reps    Short Arc Quad Sets Limitations  5#, 5" holds    Straight Leg Raises  Left;15 reps    Knee Extension  AROM    Knee Extension Limitations  4    Knee Flexion  AROM    Knee Flexion Limitations   117      Manual Therapy   Manual Therapy  Edema management;Joint mobilization    Manual therapy comments  completed seperately from all other skilled interventions    Edema Management  retro massage with BLE elevated + ankle pumps    Joint Mobilization  patellar mobs in all directions for mobility             PT Education - 12/14/17 1434    Education provided  Yes    Education Details  exercise technique, continue HEP    Person(s) Educated  Patient    Methods  Explanation;Demonstration    Comprehension  Verbalized understanding;Returned demonstration       PT Short Term Goals - 12/11/17 1535      PT SHORT TERM GOAL #1   Title  Pt will be independent with HEP and perform consistently in order to improve ROM and strength to promtoe return to PLOF.     Baseline  12/11/17: Patient has been educated on initial HEP, continue to update as needed.     Time  3    Period  Weeks    Status  On-going      PT SHORT TERM GOAL #2   Title  Pt will have decreased L knee edema by 2cm or > at joint line in order to decrease pain and improve ROM.    Baseline  12/11/17: Patient did not demonstrate an improvement of 2 cm with edema from evaluation.     Time  3    Period  Weeks    Status  On-going      PT SHORT TERM GOAL #3   Title  Pt will have improved L knee AROM from 10-100deg in order to decrease pain and maximize gait.    Baseline  12/11/17: Patient demontrated 5-115 degrees AROM of the left knee.     Time  3    Period  Weeks    Status  Achieved      PT SHORT TERM GOAL #4   Title  Pt will have 1/2 grade improvement in MMT throughout all in order to maximize balance, gait, and ability to perform  functional tasks at home and in the community.    Baseline  12/11/17: Patient demonstrated improved strength of 1/2 grade in some, but not all musculature. See MMT.     Time  3    Period  Weeks    Status  On-going        PT Long Term Goals - 12/11/17 1538      PT LONG TERM GOAL #1   Title   Pt will have improved L knee AROM from 5-115deg or better in order to further decrease pain and improve gait and stair ambulation.    Baseline  12/11/17: Patient demonstrated L knee AROM from 5-115 degrees.     Time  6    Period  Weeks    Status  Achieved      PT LONG TERM GOAL #2   Title  Pt will have 1 grade improvement in MMT throughout all muscle groups tested in order to further maximize STS transfers, stairs, and overall gait.     Baseline  12/11/17: Patient improved by 1 grade MMT in some, but not all muscle groups. See MMT.     Time  6    Period  Weeks    Status  On-going      PT LONG TERM GOAL #3   Title  Pt will be able to perform 5xSTS in 12 sec or < with no UE and proper mechanics in order to demo improved overall functional strength.    Baseline  12/11/17: Patient performed 5 x STS in 13 seconds.     Time  6    Period  Weeks    Status  On-going      PT LONG TERM GOAL #4   Title  Pt will be able to perform bil SLS for 10 sec or > with no UE in order to demo improved balance and to maximize gait and stair ambulation.    Baseline  12/11/17: Patient demonstrated ability to maintain SLS on the left lower extremity for >1 minute, but for 7.35 seconds on the right lower extremity.     Time  6    Period  Weeks    Status  Partially Met      PT LONG TERM GOAL #5   Title  Pt will have improved 3MWT by 127f or > with LRAD and gait mechanics WFL and without increases in L knee pain in order to maximzie pt's community access.    Baseline  12/11/17: Patient improved distance ambulated by greater than 100 feet however patient demonstrated deviations with gait mechanics.     Time  6    Period  Weeks    Status  Partially Met      Additional Long Term Goals   Additional Long Term Goals  Yes      PT LONG TERM GOAL #6   Title  Pt will have improved L knee AROM from 5-120deg or better in order to further decrease pain and improve gait and stair ambulation.    Time  3    Period  Weeks    Status   New    Target Date  01/01/18            Plan - 12/14/17 1527    Clinical Impression Statement  PT able to progress pt in functional strengthening this date since his AROM has begun to normalize. Pt tolerated all therex well, he just reported some fatigue during. Able to add weights to LAQ and SAQ  and began SLR this date. Pt still with edema present but this PT measured it as 2cm less at 45cm joint line compared to 47cm joint line at his initial eval. Began patellar mobs to continue to address knee joint mobility; pt noted to be more hypomobile in superior joint mob. AROM 4-117 deg this date. PT educated pt that he will likely experience DOMS following today's session and he verbalized understanding. Updated HEP to include SLR, fwd, and lat step ups. Continue as planned, progressing as able.     Rehab Potential  Good    PT Frequency  3x / week    PT Duration  6 weeks    PT Treatment/Interventions  ADLs/Self Care Home Management;Biofeedback;Cryotherapy;Electrical Stimulation;Moist Heat;Ultrasound;DME Instruction;Gait training;Stair training;Functional mobility training;Therapeutic activities;Therapeutic exercise;Balance training;Neuromuscular re-education;Patient/family education;Manual techniques;Scar mobilization;Passive range of motion;Dry needling;Energy conservation;Taping;Compression bandaging    PT Next Visit Plan  continue to address knee ROM but begin to progress functional and BLE strengthening since ROM has begun to normalize; Add SLS and sidestep next session.  continue patellar mobs    PT Home Exercise Plan  eval: heel slides, quad set, supine HS stretch; sidelying abd; 6/10: SLR, fwd/lat step ups    Consulted and Agree with Plan of Care  Patient       Patient will benefit from skilled therapeutic intervention in order to improve the following deficits and impairments:  Abnormal gait, Decreased activity tolerance, Decreased balance, Decreased endurance, Decreased mobility, Decreased  range of motion, Decreased scar mobility, Decreased strength, Difficulty walking, Hypomobility, Increased edema, Increased fascial restricitons, Increased muscle spasms, Impaired flexibility, Improper body mechanics, Pain  Visit Diagnosis: Stiffness of left knee, not elsewhere classified  Chronic pain of left knee  Localized edema  Difficulty in walking, not elsewhere classified  Muscle weakness (generalized)     Problem List Patient Active Problem List   Diagnosis Date Noted  . OA (osteoarthritis) of knee 11/16/2017       Geraldine Solar PT, DPT  [place] [address] [place], [place], [address] Phone: [phone]   Fax:  [phone]  Name: Jonathan Ochoa MRN: [id number] Date of Birth: 1951/02/22

## 2017-12-16 ENCOUNTER — Ambulatory Visit (HOSPITAL_COMMUNITY): Payer: Medicare HMO

## 2017-12-16 ENCOUNTER — Encounter (HOSPITAL_COMMUNITY): Payer: Self-pay

## 2017-12-16 DIAGNOSIS — M25562 Pain in left knee: Secondary | ICD-10-CM

## 2017-12-16 DIAGNOSIS — R600 Localized edema: Secondary | ICD-10-CM | POA: Diagnosis not present

## 2017-12-16 DIAGNOSIS — G8929 Other chronic pain: Secondary | ICD-10-CM

## 2017-12-16 DIAGNOSIS — M25662 Stiffness of left knee, not elsewhere classified: Secondary | ICD-10-CM

## 2017-12-16 DIAGNOSIS — M6281 Muscle weakness (generalized): Secondary | ICD-10-CM

## 2017-12-16 DIAGNOSIS — R262 Difficulty in walking, not elsewhere classified: Secondary | ICD-10-CM

## 2017-12-16 NOTE — Therapy (Signed)
[place], [place], [address] Phone: [phone]   Fax:  [phone]  Physical Therapy Treatment  Patient Details  Name: Jonathan Ochoa MRN: [id number] Date of Birth: 1951/06/14 Referring Provider: Mickel Crow, PA-C (Surgeon [name])   Encounter Date: 12/16/2017  PT End of Session - 12/16/17 1516    Visit Number  12    Number of Visits  19    Date for PT Re-Evaluation  01/01/18 Minireassess complete 12/11/17    Authorization Type  [employer] Medicare HMO    Authorization Time Period  11/20/17 to 01/01/18    Authorization - Visit Number  2    Authorization - Number of Visits  10    PT Start Time  4315    PT Stop Time  1515    PT Time Calculation (min)  44 min    Activity Tolerance  Patient tolerated treatment well    Behavior During Therapy  [facility] for tasks assessed/performed       Past Medical History:  Diagnosis Date  . Arthritis   . Hypothyroidism   . Thyroid disease     Past Surgical History:  Procedure Laterality Date  . adhesions of abdomen     from scar tissue from appendectomy  . AMPUTATION OF REPLICATED TOES     partial tip amputation of left big toe, and next toe   . APPENDECTOMY  1977  . arthroscpy      left knee -meniscus tear  . STERIOD INJECTION Right 11/16/2017   Procedure: CORTISONE  INJECTION RIGHT KNEE;  Surgeon: [name], MD;  Location: [facility];  Service: Orthopedics;  Laterality: Right;  . TOE SURGERY    . TOTAL KNEE ARTHROPLASTY Left 11/16/2017   Procedure: LEFT TOTAL KNEE ARTHROPLASTY;  Surgeon: [name], MD;  Location: [facility];  Service: Orthopedics;  Laterality: Left;    There were no vitals filed for this visit.  Subjective Assessment - 12/16/17 1435    Subjective  Pt stated he is doing well today, no pain currently.  Rt knee doesn't bother him unless walking a lot on it.  Next apt scheduled with MD next Tuesday, plans to discuss Rt knee surgery.       Patient Stated Goals  get well    Currently in Pain?  No/denies                       [facility] Adult PT Treatment/Exercise - 12/16/17 0001      Knee/Hip Exercises: Stretches   Active Hamstring Stretch  Left;3 reps;30 seconds    Active Hamstring Stretch Limitations  supine wiht rope    Gastroc Stretch  Both;3 reps;30 seconds;Limitations    Gastroc Stretch Limitations  slant board      Knee/Hip Exercises: Aerobic   Stationary Bike  x3 mins, full revolutions (unbilled)      Knee/Hip Exercises: Standing   Heel Raises  Both;20 reps    Heel Raises Limitations  heel and toe    Terminal Knee Extension Limitations  LLE, 15x10" BTB    Lateral Step Up  Left;15 reps;Step Height: 6"    Forward Step Up  Left;15 reps;Hand Hold: 0;Step Height: 6"    Step Down  Left;15 reps;Hand Hold: 2;Step Height: 6"    Functional Squat  15 reps;Limitations    Functional Squat Limitations  cues for form, chair behind for form    SLS  3 reps Lt 46", Rt 6" max  Other Standing Knee Exercises  sidestep with RTB 2RT, cueing to reduce ER and posture      Knee/Hip Exercises: Supine   Short Arc Quad Sets  Left;15 reps    Short Arc Quad Sets Limitations  5#, 5" holds      Manual Therapy   Manual Therapy  Edema management;Joint mobilization    Manual therapy comments  completed seperately from all other skilled interventions    Edema Management  retro massage with BLE elevated + ankle pumps    Joint Mobilization  patellar mobs in all directions for mobility               PT Short Term Goals - 12/11/17 1535      PT SHORT TERM GOAL #1   Title  Pt will be independent with HEP and perform consistently in order to improve ROM and strength to promtoe return to PLOF.     Baseline  12/11/17: Patient has been educated on initial HEP, continue to update as needed.     Time  3    Period  Weeks    Status  On-going      PT SHORT TERM GOAL #2   Title  Pt will have decreased L knee edema by 2cm or > at  joint line in order to decrease pain and improve ROM.    Baseline  12/11/17: Patient did not demonstrate an improvement of 2 cm with edema from evaluation.     Time  3    Period  Weeks    Status  On-going      PT SHORT TERM GOAL #3   Title  Pt will have improved L knee AROM from 10-100deg in order to decrease pain and maximize gait.    Baseline  12/11/17: Patient demontrated 5-115 degrees AROM of the left knee.     Time  3    Period  Weeks    Status  Achieved      PT SHORT TERM GOAL #4   Title  Pt will have 1/2 grade improvement in MMT throughout all in order to maximize balance, gait, and ability to perform functional tasks at home and in the community.    Baseline  12/11/17: Patient demonstrated improved strength of 1/2 grade in some, but not all musculature. See MMT.     Time  3    Period  Weeks    Status  On-going        PT Long Term Goals - 12/11/17 1538      PT LONG TERM GOAL #1   Title  Pt will have improved L knee AROM from 5-115deg or better in order to further decrease pain and improve gait and stair ambulation.    Baseline  12/11/17: Patient demonstrated L knee AROM from 5-115 degrees.     Time  6    Period  Weeks    Status  Achieved      PT LONG TERM GOAL #2   Title  Pt will have 1 grade improvement in MMT throughout all muscle groups tested in order to further maximize STS transfers, stairs, and overall gait.     Baseline  12/11/17: Patient improved by 1 grade MMT in some, but not all muscle groups. See MMT.     Time  6    Period  Weeks    Status  On-going      PT LONG TERM GOAL #3   Title  Pt will be able to perform 5xSTS  in 12 sec or < with no UE and proper mechanics in order to demo improved overall functional strength.    Baseline  12/11/17: Patient performed 5 x STS in 13 seconds.     Time  6    Period  Weeks    Status  On-going      PT LONG TERM GOAL #4   Title  Pt will be able to perform bil SLS for 10 sec or > with no UE in order to demo improved balance and to  maximize gait and stair ambulation.    Baseline  12/11/17: Patient demonstrated ability to maintain SLS on the left lower extremity for >1 minute, but for 7.35 seconds on the right lower extremity.     Time  6    Period  Weeks    Status  Partially Met      PT LONG TERM GOAL #5   Title  Pt will have improved 3MWT by 129f or > with LRAD and gait mechanics WFL and without increases in L knee pain in order to maximzie pt's community access.    Baseline  12/11/17: Patient improved distance ambulated by greater than 100 feet however patient demonstrated deviations with gait mechanics.     Time  6    Period  Weeks    Status  Partially Met      Additional Long Term Goals   Additional Long Term Goals  Yes      PT LONG TERM GOAL #6   Title  Pt will have improved L knee AROM from 5-120deg or better in order to further decrease pain and improve gait and stair ambulation.    Time  3    Period  Weeks    Status  New    Target Date  01/01/18            Plan - 12/16/17 1622    Clinical Impression Statement  Continued session focus with knee mobility and functional strengthening.  Pt progressing well wtih functional strengthening, improved form and presents with less effort to complete tasks.  Pt continues to be limited with gait mechanics due to Rt knee pain/mobility and presents with trendelenburg pain mechanics also due to gluteal weakness.  Added sidestep and SLS to increased gluteal activaiton to assist with SLS during gait training.  Pt improving posture through does continues to require cueing to reduce forward flexion at the hips.  AROM progressing well with improved flexion today AROM 4-123 degrees.  Added standing TKE to HEP.  Pt continues to exhibit edema present proximal knee, manual retromassage complete for edema control and patella mobs complete, noted impaired patella movements medial to lateral.  No reports of increased pain through session.      Rehab Potential  Good    PT Frequency  3x  / week    PT Duration  6 weeks    PT Treatment/Interventions  ADLs/Self Care Home Management;Biofeedback;Cryotherapy;Electrical Stimulation;Moist Heat;Ultrasound;DME Instruction;Gait training;Stair training;Functional mobility training;Therapeutic activities;Therapeutic exercise;Balance training;Neuromuscular re-education;Patient/family education;Manual techniques;Scar mobilization;Passive range of motion;Dry needling;Energy conservation;Taping;Compression bandaging    PT Next Visit Plan  F/U wiht MD apt.  continue to address knee ROM but begin to progress functional and BLE strengthening since ROM has begun to normalize; Continue SLS and sidestep next session.  continue patellar mobs    PT Home Exercise Plan  eval: heel slides, quad set, supine HS stretch; sidelying abd; 6/10: SLR, fwd/lat step ups       Patient will benefit from skilled therapeutic  intervention in order to improve the following deficits and impairments:  Abnormal gait, Decreased activity tolerance, Decreased balance, Decreased endurance, Decreased mobility, Decreased range of motion, Decreased scar mobility, Decreased strength, Difficulty walking, Hypomobility, Increased edema, Increased fascial restricitons, Increased muscle spasms, Impaired flexibility, Improper body mechanics, Pain  Visit Diagnosis: Stiffness of left knee, not elsewhere classified  Chronic pain of left knee  Localized edema  Difficulty in walking, not elsewhere classified  Muscle weakness (generalized)     Problem List Patient Active Problem List   Diagnosis Date Noted  . OA (osteoarthritis) of knee 11/16/2017   Jonathan Ochoa, LPTA; CBIS [phone]  Jonathan Ochoa 12/16/2017, 4:30 PM  [place], [place], [address] Phone: [phone]   Fax:  [phone]  Name: Jonathan Ochoa MRN: [id number] Date of Birth: [birth year]

## 2017-12-16 NOTE — Patient Instructions (Signed)
Knee Extension: Quad Set (Eccentric), (Resistance Band)    Stand holding support, tubing behind fully extended knee. Slowly bend knee for 3-5 seconds. Use red resistance band. 15 reps per set, 1-2 sets per day,, 4 days per week.  http://ecce.exer.us/132   Copyright  VHI. All rights reserved.

## 2017-12-18 ENCOUNTER — Encounter (HOSPITAL_COMMUNITY): Payer: Self-pay

## 2017-12-18 ENCOUNTER — Ambulatory Visit (HOSPITAL_COMMUNITY): Payer: Medicare HMO

## 2017-12-18 DIAGNOSIS — M25562 Pain in left knee: Secondary | ICD-10-CM | POA: Diagnosis not present

## 2017-12-18 DIAGNOSIS — R600 Localized edema: Secondary | ICD-10-CM

## 2017-12-18 DIAGNOSIS — R262 Difficulty in walking, not elsewhere classified: Secondary | ICD-10-CM

## 2017-12-18 DIAGNOSIS — M25662 Stiffness of left knee, not elsewhere classified: Secondary | ICD-10-CM

## 2017-12-18 DIAGNOSIS — G8929 Other chronic pain: Secondary | ICD-10-CM | POA: Diagnosis not present

## 2017-12-18 DIAGNOSIS — M6281 Muscle weakness (generalized): Secondary | ICD-10-CM | POA: Diagnosis not present

## 2017-12-18 NOTE — Therapy (Signed)
[place], [place], [address] Phone: [phone]   Fax:  [phone]  Physical Therapy Treatment  Patient Details  Name: Jonathan Ochoa MRN: [id number] Date of Birth: September 12, 1950 Referring Provider: Mickel Crow, PA-C (Surgeon [name])   Encounter Date: 12/18/2017  PT End of Session - 12/18/17 1440    Visit Number  13    Number of Visits  19    Date for PT Re-Evaluation  01/01/18 Minireassess complete 12/11/17    Authorization Type  [employer] HMO    Authorization Time Period  11/20/17 to 01/01/18    Authorization - Visit Number  3    Authorization - Number of Visits  10    PT Start Time  1435 3' on bike, no charge    PT Stop Time  1517    PT Time Calculation (min)  42 min    Activity Tolerance  Patient tolerated treatment well    Behavior During Therapy  [facility] for tasks assessed/performed       Past Medical History:  Diagnosis Date  . Arthritis   . Hypothyroidism   . Thyroid disease     Past Surgical History:  Procedure Laterality Date  . adhesions of abdomen     from scar tissue from appendectomy  . AMPUTATION OF REPLICATED TOES     partial tip amputation of left big toe, and next toe   . APPENDECTOMY  1977  . arthroscpy      left knee -meniscus tear  . STERIOD INJECTION Right 11/16/2017   Procedure: CORTISONE  INJECTION RIGHT KNEE;  Surgeon: [name], MD;  Location: [facility];  Service: Orthopedics;  Laterality: Right;  . TOE SURGERY    . TOTAL KNEE ARTHROPLASTY Left 11/16/2017   Procedure: LEFT TOTAL KNEE ARTHROPLASTY;  Surgeon: [name], MD;  Location: [facility];  Service: Orthopedics;  Laterality: Left;    There were no vitals filed for this visit.  Subjective Assessment - 12/18/17 1438    Subjective  Pt stated he is doing well today, no reoprts of current pain.  No reports of Rt knee pain, feels it does affect his walking abilities.     Patient Stated Goals  get well    Currently in Pain?  No/denies                       [facility] Adult PT Treatment/Exercise - 12/18/17 0001      Knee/Hip Exercises: Standing   Heel Raises  Both;20 reps    Heel Raises Limitations  heel and toe    Terminal Knee Extension Limitations  TKE HEP    Lateral Step Up  Left;15 reps;Hand Hold: 1;Limitations    Lateral Step Up Limitations  7in step height    Functional Squat  20 reps;Limitations    Functional Squat Limitations  improved form, chair behind    Stairs  5RT reciprocal 1 HR    SLS  3 reps Lt 60", Rt 4"    SLS with Vectors  Rt LE 5x 5" with visual cueing to improve awareness of posture    Other Standing Knee Exercises  hip hikes with 2" step 15 reps each    Other Standing Knee Exercises  sidestep with RTB 2RT, cueing to reduce ER and posture; tandem stance on foam 30" holds; 2 set with palvo      Knee/Hip Exercises: Supine   Quad Sets  15 reps;Left  Quad Sets Limitations  cueing to reduce glut contraction in supine, improved long sitting. tactile cue8    Short Arc [employer]  Left;15 reps    Short Arc Quad Sets Limitations  -- 5#, 5" holds    Knee Extension  AROM    Knee Extension Limitations  4    Knee Flexion  AROM    Knee Flexion Limitations  125      Manual Therapy   Manual Therapy  Edema management;Joint mobilization    Manual therapy comments  completed seperately from all other skilled interventions    Edema Management  retro massage with BLE elevated + ankle pumps    Joint Mobilization  patellar mobs in all directions for mobility               PT Short Term Goals - 12/11/17 1535      PT SHORT TERM GOAL #1   Title  Pt will be independent with HEP and perform consistently in order to improve ROM and strength to promtoe return to PLOF.     Baseline  12/11/17: Patient has been educated on initial HEP, continue to update as needed.     Time  3    Period  Weeks    Status  On-going      PT SHORT TERM GOAL #2   Title  Pt will have  decreased L knee edema by 2cm or > at joint line in order to decrease pain and improve ROM.    Baseline  12/11/17: Patient did not demonstrate an improvement of 2 cm with edema from evaluation.     Time  3    Period  Weeks    Status  On-going      PT SHORT TERM GOAL #3   Title  Pt will have improved L knee AROM from 10-100deg in order to decrease pain and maximize gait.    Baseline  12/11/17: Patient demontrated 5-115 degrees AROM of the left knee.     Time  3    Period  Weeks    Status  Achieved      PT SHORT TERM GOAL #4   Title  Pt will have 1/2 grade improvement in MMT throughout all in order to maximize balance, gait, and ability to perform functional tasks at home and in the community.    Baseline  12/11/17: Patient demonstrated improved strength of 1/2 grade in some, but not all musculature. See MMT.     Time  3    Period  Weeks    Status  On-going        PT Long Term Goals - 12/11/17 1538      PT LONG TERM GOAL #1   Title  Pt will have improved L knee AROM from 5-115deg or better in order to further decrease pain and improve gait and stair ambulation.    Baseline  12/11/17: Patient demonstrated L knee AROM from 5-115 degrees.     Time  6    Period  Weeks    Status  Achieved      PT LONG TERM GOAL #2   Title  Pt will have 1 grade improvement in MMT throughout all muscle groups tested in order to further maximize STS transfers, stairs, and overall gait.     Baseline  12/11/17: Patient improved by 1 grade MMT in some, but not all muscle groups. See MMT.     Time  6    Period  Weeks  Status  On-going      PT LONG TERM GOAL #3   Title  Pt will be able to perform 5xSTS in 12 sec or < with no UE and proper mechanics in order to demo improved overall functional strength.    Baseline  12/11/17: Patient performed 5 x STS in 13 seconds.     Time  6    Period  Weeks    Status  On-going      PT LONG TERM GOAL #4   Title  Pt will be able to perform bil SLS for 10 sec or > with no UE in  order to demo improved balance and to maximize gait and stair ambulation.    Baseline  12/11/17: Patient demonstrated ability to maintain SLS on the left lower extremity for >1 minute, but for 7.35 seconds on the right lower extremity.     Time  6    Period  Weeks    Status  Partially Met      PT LONG TERM GOAL #5   Title  Pt will have improved 3MWT by 153f or > with LRAD and gait mechanics WFL and without increases in L knee pain in order to maximzie pt's community access.    Baseline  12/11/17: Patient improved distance ambulated by greater than 100 feet however patient demonstrated deviations with gait mechanics.     Time  6    Period  Weeks    Status  Partially Met      Additional Long Term Goals   Additional Long Term Goals  Yes      PT LONG TERM GOAL #6   Title  Pt will have improved L knee AROM from 5-120deg or better in order to further decrease pain and improve gait and stair ambulation.    Time  3    Period  Weeks    Status  New    Target Date  01/01/18            Plan - 12/18/17 1529    Clinical Impression Statement  Continued session focus with knee mobility and functional strengthening.  Progress hip strengthening with balance activities for gluteal strengthening with resistance with sidestepping, vector stance and tandem stance on foam.  Progressed functional strengthening with stair training and increased height with lateral step ups.  AROM at 4-125 degrees.  Noted reduced edema through does continues to have some proximal knee, patella mobs complete wiht impaired movements medial to lateral as well as superior/inferionr.  No reports of pain through session.    Rehab Potential  Good    PT Frequency  3x / week    PT Duration  6 weeks    PT Treatment/Interventions  ADLs/Self Care Home Management;Biofeedback;Cryotherapy;Electrical Stimulation;Moist Heat;Ultrasound;DME Instruction;Gait training;Stair training;Functional mobility training;Therapeutic activities;Therapeutic  exercise;Balance training;Neuromuscular re-education;Patient/family education;Manual techniques;Scar mobilization;Passive range of motion;Dry needling;Energy conservation;Taping;Compression bandaging    PT Next Visit Plan  Review goals next session prior MD apt.  Continue to address knee extension and progress functional strengthening.  Continue manual including patella mobs.      PT Home Exercise Plan  eval: heel slides, quad set, supine HS stretch; sidelying abd; 6/10: SLR, fwd/lat step ups; TKE       Patient will benefit from skilled therapeutic intervention in order to improve the following deficits and impairments:     Visit Diagnosis: Stiffness of left knee, not elsewhere classified  Chronic pain of left knee  Localized edema  Difficulty in walking, not elsewhere classified  Muscle weakness (generalized)     Problem List Patient Active Problem List   Diagnosis Date Noted  . OA (osteoarthritis) of knee 11/16/2017   Ihor Austin, LPTA; CBIS [phone]  Aldona Lento 12/18/2017, 3:54 PM  [place], [place], [address] Phone: [phone]   Fax:  [phone]  Name: Jonathan Ochoa MRN: [id number] Date of Birth: 1950/11/12

## 2017-12-21 ENCOUNTER — Telehealth (HOSPITAL_COMMUNITY): Payer: Self-pay

## 2017-12-21 ENCOUNTER — Encounter (HOSPITAL_COMMUNITY): Payer: Self-pay

## 2017-12-21 ENCOUNTER — Ambulatory Visit (HOSPITAL_COMMUNITY): Payer: Medicare HMO

## 2017-12-21 DIAGNOSIS — R600 Localized edema: Secondary | ICD-10-CM

## 2017-12-21 DIAGNOSIS — G8929 Other chronic pain: Secondary | ICD-10-CM

## 2017-12-21 DIAGNOSIS — M25562 Pain in left knee: Secondary | ICD-10-CM

## 2017-12-21 DIAGNOSIS — M6281 Muscle weakness (generalized): Secondary | ICD-10-CM

## 2017-12-21 DIAGNOSIS — M25662 Stiffness of left knee, not elsewhere classified: Secondary | ICD-10-CM | POA: Diagnosis not present

## 2017-12-21 DIAGNOSIS — R262 Difficulty in walking, not elsewhere classified: Secondary | ICD-10-CM | POA: Diagnosis not present

## 2017-12-21 NOTE — Therapy (Signed)
[place], [place], [address] Phone: [phone]   Fax:  [phone]  Physical Therapy Treatment  Patient Details  Name: Jonathan Ochoa MRN: [id number] Date of Birth: 01-Dec-1950 Referring Provider: Mickel Crow, PA-C (Surgeon [name])   Encounter Date: 12/21/2017  PT End of Session - 12/21/17 1348    Visit Number  14    Number of Visits  19    Date for PT Re-Evaluation  01/01/18 Minireassess complete 12/11/17    Authorization Type  [employer] Medicare HMO    Authorization Time Period  11/20/17 to 01/01/18    Authorization - Visit Number  4    Authorization - Number of Visits  10    PT Start Time  1346    PT Stop Time  1426    PT Time Calculation (min)  40 min    Activity Tolerance  Patient tolerated treatment well    Behavior During Therapy  [facility] for tasks assessed/performed       Past Medical History:  Diagnosis Date  . Arthritis   . Hypothyroidism   . Thyroid disease     Past Surgical History:  Procedure Laterality Date  . adhesions of abdomen     from scar tissue from appendectomy  . AMPUTATION OF REPLICATED TOES     partial tip amputation of left big toe, and next toe   . APPENDECTOMY  1977  . arthroscpy      left knee -meniscus tear  . STERIOD INJECTION Right 11/16/2017   Procedure: CORTISONE  INJECTION RIGHT KNEE;  Surgeon: [name], MD;  Location: [facility];  Service: Orthopedics;  Laterality: Right;  . TOE SURGERY    . TOTAL KNEE ARTHROPLASTY Left 11/16/2017   Procedure: LEFT TOTAL KNEE ARTHROPLASTY;  Surgeon: [name], MD;  Location: [facility];  Service: Orthopedics;  Laterality: Left;    There were no vitals filed for this visit.  Subjective Assessment - 12/21/17 1348    Subjective  Pt reports that his L knee is feeling great. He sees his MD tomorrow.     Patient Stated Goals  get well    Currently in Pain?  No/denies          [facility] PT Assessment - 12/21/17 0001       Circumferential Edema   Circumferential - Left   45cm joint line           [facility] Adult PT Treatment/Exercise - 12/21/17 0001      Knee/Hip Exercises: Stretches   Passive Hamstring Stretch  Left;30 seconds;3 reps;Limitations    Passive Hamstring Stretch Limitations  On 12 inch step    Knee: Self-Stretch to increase Flexion  3 reps;10 seconds    Knee: Self-Stretch Limitations  12" step    Gastroc Stretch  Both;3 reps;30 seconds;Limitations    Gastroc Stretch Limitations  slant board      Knee/Hip Exercises: Machines for Strengthening   Cybex Knee Extension  2 plates, LLE only, 2x20 reps    Cybex Knee Flexion  3 plates, LLE only, 2x20 reps    Other Machine  power tower leg press: 34deg BLE x20 reps, 25deg LLE only x20 reps      Knee/Hip Exercises: Standing   Hip Abduction  Both;15 reps    Abduction Limitations  BTB, hip diagonals    Other Standing Knee Exercises  hip hikes on 4" step 2x15 reps each    Other Standing Knee Exercises  sidestepping BTB 1f  x2RT      Knee/Hip Exercises: Supine   Knee Extension Limitations  2    Knee Flexion Limitations  122      Manual Therapy   Manual Therapy  Edema management;Joint mobilization    Manual therapy comments  completed seperately from all other skilled interventions    Edema Management  retro massage with BLE elevated + ankle pumps    Joint Mobilization  patellar mobs in all directions for mobility             PT Short Term Goals - 12/11/17 1535      PT SHORT TERM GOAL #1   Title  Pt will be independent with HEP and perform consistently in order to improve ROM and strength to promtoe return to PLOF.     Baseline  12/11/17: Patient has been educated on initial HEP, continue to update as needed.     Time  3    Period  Weeks    Status  On-going      PT SHORT TERM GOAL #2   Title  Pt will have decreased L knee edema by 2cm or > at joint line in order to decrease pain and improve ROM.    Baseline  12/11/17: Patient did not  demonstrate an improvement of 2 cm with edema from evaluation.     Time  3    Period  Weeks    Status  On-going      PT SHORT TERM GOAL #3   Title  Pt will have improved L knee AROM from 10-100deg in order to decrease pain and maximize gait.    Baseline  12/11/17: Patient demontrated 5-115 degrees AROM of the left knee.     Time  3    Period  Weeks    Status  Achieved      PT SHORT TERM GOAL #4   Title  Pt will have 1/2 grade improvement in MMT throughout all in order to maximize balance, gait, and ability to perform functional tasks at home and in the community.    Baseline  12/11/17: Patient demonstrated improved strength of 1/2 grade in some, but not all musculature. See MMT.     Time  3    Period  Weeks    Status  On-going        PT Long Term Goals - 12/11/17 1538      PT LONG TERM GOAL #1   Title  Pt will have improved L knee AROM from 5-115deg or better in order to further decrease pain and improve gait and stair ambulation.    Baseline  12/11/17: Patient demonstrated L knee AROM from 5-115 degrees.     Time  6    Period  Weeks    Status  Achieved      PT LONG TERM GOAL #2   Title  Pt will have 1 grade improvement in MMT throughout all muscle groups tested in order to further maximize STS transfers, stairs, and overall gait.     Baseline  12/11/17: Patient improved by 1 grade MMT in some, but not all muscle groups. See MMT.     Time  6    Period  Weeks    Status  On-going      PT LONG TERM GOAL #3   Title  Pt will be able to perform 5xSTS in 12 sec or < with no UE and proper mechanics in order to demo improved overall functional strength.  Baseline  12/11/17: Patient performed 5 x STS in 13 seconds.     Time  6    Period  Weeks    Status  On-going      PT LONG TERM GOAL #4   Title  Pt will be able to perform bil SLS for 10 sec or > with no UE in order to demo improved balance and to maximize gait and stair ambulation.    Baseline  12/11/17: Patient demonstrated ability to  maintain SLS on the left lower extremity for >1 minute, but for 7.35 seconds on the right lower extremity.     Time  6    Period  Weeks    Status  Partially Met      PT LONG TERM GOAL #5   Title  Pt will have improved 3MWT by 124f or > with LRAD and gait mechanics WFL and without increases in L knee pain in order to maximzie pt's community access.    Baseline  12/11/17: Patient improved distance ambulated by greater than 100 feet however patient demonstrated deviations with gait mechanics.     Time  6    Period  Weeks    Status  Partially Met      Additional Long Term Goals   Additional Long Term Goals  Yes      PT LONG TERM GOAL #6   Title  Pt will have improved L knee AROM from 5-120deg or better in order to further decrease pain and improve gait and stair ambulation.    Time  3    Period  Weeks    Status  New    Target Date  01/01/18            Plan - 12/21/17 1426    Clinical Impression Statement  PT progressed pt's strengthening this date by progressing to BTB during hip strengthening and adding cybex and power tower leg press. Pt tolerated all well, only slightly limited by R knee pain. Pt returns to his MD tomorrow so PT updated pt's edema and AROM measurements and forwarded to his MD. Pt is progressing very well towards his goals. His AROM was 2 to 122deg this date. Due to progress made thus far and advances in AROM, PT decreasing pt's frequency to 2x/week for remainder of his POC with potential to d/c early, pending MD follow up tomorrow. Pt agreeable to this plan. Continue as planned, progressing strengthening as tolerated.     Rehab Potential  Good    PT Frequency  2x / week    PT Duration  2 weeks decreasing frequency to 2x/week for remainder of PT POC    PT Treatment/Interventions  ADLs/Self Care Home Management;Biofeedback;Cryotherapy;Electrical Stimulation;Moist Heat;Ultrasound;DME Instruction;Gait training;Stair training;Functional mobility training;Therapeutic  activities;Therapeutic exercise;Balance training;Neuromuscular re-education;Patient/family education;Manual techniques;Scar mobilization;Passive range of motion;Dry needling;Energy conservation;Taping;Compression bandaging    PT Next Visit Plan  Continue cybex and leg press machines; Continue to address knee extension and progress functional strengthening. Continue manual including patella mobs.      PT Home Exercise Plan  eval: heel slides, quad set, supine HS stretch; sidelying abd; 6/10: SLR, fwd/lat step ups; TKE    Consulted and Agree with Plan of Care  Patient       Patient will benefit from skilled therapeutic intervention in order to improve the following deficits and impairments:     Visit Diagnosis: Stiffness of left knee, not elsewhere classified  Chronic pain of left knee  Localized edema  Difficulty in walking, not elsewhere  classified  Muscle weakness (generalized)     Problem List Patient Active Problem List   Diagnosis Date Noted  . OA (osteoarthritis) of knee 11/16/2017       Geraldine Solar PT, DPT  [facility] [address] [place], [place], [address] Phone: [phone]   Fax:  [phone]  Name: KELLIS MCADAM MRN: [id number] Date of Birth: Oct 11, 1950

## 2017-12-21 NOTE — Telephone Encounter (Signed)
Therapist decrease treatment to twice a week

## 2017-12-22 DIAGNOSIS — Z96652 Presence of left artificial knee joint: Secondary | ICD-10-CM | POA: Diagnosis not present

## 2017-12-22 DIAGNOSIS — Z471 Aftercare following joint replacement surgery: Secondary | ICD-10-CM | POA: Diagnosis not present

## 2017-12-22 DIAGNOSIS — M1711 Unilateral primary osteoarthritis, right knee: Secondary | ICD-10-CM | POA: Diagnosis not present

## 2017-12-23 ENCOUNTER — Ambulatory Visit (HOSPITAL_COMMUNITY): Payer: Medicare HMO

## 2017-12-23 DIAGNOSIS — R262 Difficulty in walking, not elsewhere classified: Secondary | ICD-10-CM | POA: Diagnosis not present

## 2017-12-23 DIAGNOSIS — M25662 Stiffness of left knee, not elsewhere classified: Secondary | ICD-10-CM

## 2017-12-23 DIAGNOSIS — G8929 Other chronic pain: Secondary | ICD-10-CM

## 2017-12-23 DIAGNOSIS — M6281 Muscle weakness (generalized): Secondary | ICD-10-CM | POA: Diagnosis not present

## 2017-12-23 DIAGNOSIS — M25562 Pain in left knee: Secondary | ICD-10-CM

## 2017-12-23 DIAGNOSIS — R600 Localized edema: Secondary | ICD-10-CM | POA: Diagnosis not present

## 2017-12-23 NOTE — Patient Instructions (Signed)
Access Code: [email or web address]  URL: [email or web address]  Date: 12/23/2017  Prepared by: Geraldine Solar   Exercises Knee Extension with Weight Machine - 10 reps - 3 sets - 1x daily - 7x weekly Single Leg Knee Extension with Weight Machine - 10 reps - 3 sets                            - 1x daily - 7x weekly Eccentric Knee Extension with Weight Machine - 10 reps - 3 sets - 1x daily - 7x weekly Hamstring Curl with Weight Machine - 10 reps - 3 sets - 1x daily - 7x weekly Single Leg Hamstring Curl with Weight Machine - 10 reps - 3 sets                            - 1x daily - 7x weekly Eccentric Hamstring Curl with Weight Machine - 10 reps - 3 sets - 1x daily - 7x weekly Side Stepping with Resistance at Ankles - 10 reps - 3 sets - 1x daily - 7x weekly Sidelying Hip Abduction - 10 reps - 3 sets - 1x daily - 7x weekly Standing Hip Abduction with Resistance at Ankles - 10 reps - 3 sets - 1x daily - 7x weekly Mini Squat with Chair - 10 reps - 3 sets - 1x daily - 7x weekly Supine Bridge - 10 reps - 3 sets - 1x daily - 7x weekly Step Up - 10 reps - 3 sets - 1x daily - 7x weekly Lateral Step Ups - 10 reps - 3 sets - 1x daily - 7x weekly

## 2017-12-23 NOTE — Therapy (Signed)
[place], [place], [address] Phone: [phone]   Fax:  [phone]   Progress Note Reporting Period 12/11/17 to 12/23/17  See note below for Objective Data and Assessment of Progress/Goals.   Physical Therapy Treatment/Discharge Summary  Patient Details  Name: Jonathan Ochoa MRN: [id number] Date of Birth: [birth year] Referring Provider: Mickel Crow, PA-C (Surgeon: [name], MD)   Encounter Date: 12/23/2017  PT End of Session - 12/23/17 1425    Visit Number  15    Number of Visits  19    Date for PT Re-Evaluation  01/01/18 Minireassess complete 12/11/17    Authorization Type  [employer] H[employer]    Authorization Time Period  11/20/17 to 01/01/18    Authorization - Visit Number  5    Authorization - Number of Visits  10    PT Start Time  6546    PT Stop Time  1456    PT Time Calculation (min)  31 min    Activity Tolerance  Patient tolerated treatment well    Behavior During Therapy  [facility] for tasks assessed/performed       Past Medical History:  Diagnosis Date  . Arthritis   . Hypothyroidism   . Thyroid disease     Past Surgical History:  Procedure Laterality Date  . adhesions of abdomen     from scar tissue from appendectomy  . AMPUTATION OF REPLICATED TOES     partial tip amputation of left big toe, and next toe   . APPENDECTOMY  1977  . arthroscpy      left knee -meniscus tear  . STERIOD INJECTION Right 11/16/2017   Procedure: CORTISONE  INJECTION RIGHT KNEE;  Surgeon: [name], MD;  Location: [facility];  Service: Orthopedics;  Laterality: Right;  . TOE SURGERY    . TOTAL KNEE ARTHROPLASTY Left 11/16/2017   Procedure: LEFT TOTAL KNEE ARTHROPLASTY;  Surgeon: [name], MD;  Location: [facility];  Service: Orthopedics;  Laterality: Left;    There were no vitals filed for this visit.  Subjective Assessment - 12/23/17 1426    Subjective  Pt states that Dr. Wynelle Link was very pleased  with his progress so far and told him he could discontinue PT so pt is asking to be discharged today.     Patient Stated Goals  get well    Currently in Pain?  No/denies         [facility] PT Assessment - 12/23/17 0001      Assessment   Medical Diagnosis  L TKA    Referring Provider  Mickel Crow, PA-C Surgeon: [name], MD    Next MD Visit  01/26/18      Circumferential Edema   Circumferential - Left   44.5cm joint line      AROM   Left Knee Extension  0 was 5    Left Knee Flexion  123 was 115      Strength   Right Hip Flexion  5/5 was 4+    Right Hip Extension  4+/5 was 3    Right Hip ABduction  4/5 was 4    Left Hip Flexion  5/5 was 4    Left Hip Extension  4+/5 was 4    Left Hip ABduction  4/5 was 4-    Left Knee Flexion  5/5 was 4+    Left Knee Extension  5/5 was 4      Ambulation/Gait   Ambulation Distance (  Feet)  772 Feet was 678f    Assistive device  None    Gait Pattern  Step-through pattern;Trendelenburg;Antalgic    Gait Comments  R knee pain only limiting factor at this point, [facility]     Balance   Balance Assessed  Yes      Static Standing Balance   Static Standing - Balance Support  No upper extremity supported    Static Standing Balance -  Activities   Single Leg Stance - Right Leg;Single Leg Stance - Left Leg    Static Standing - Comment/# of Minutes  R: 11sec L: 30 sec      Standardized Balance Assessment   Standardized Balance Assessment  Five Times Sit to Stand    Five times sit to stand comments   7.6sec, no UE, good form was 13 sec           PT Education - 12/23/17 1426    Education provided  Yes    Education Details  discharge plans and HEP    Person(s) Educated  Patient    Methods  Explanation;Demonstration;Handout    Comprehension  Verbalized understanding;Returned demonstration       PT Short Term Goals - 12/23/17 1432      PT SHORT TERM GOAL #1   Title  Pt will be independent with HEP and perform consistently  in order to improve ROM and strength to promtoe return to PLOF.     Baseline  6/19: compliant    Time  3    Period  Weeks    Status  Achieved      PT SHORT TERM GOAL #2   Title  Pt will have decreased L knee edema by 2cm or > at joint line in order to decrease pain and improve ROM.    Baseline  6/19: decreased by 2.5cm from eval    Time  3    Period  Weeks    Status  Achieved      PT SHORT TERM GOAL #3   Title  Pt will have improved L knee AROM from 10-100deg in order to decrease pain and maximize gait.    Baseline  6/19: 0-123deg this date    Time  3    Period  Weeks    Status  Achieved      PT SHORT TERM GOAL #4   Title  Pt will have 1/2 grade improvement in MMT throughout all in order to maximize balance, gait, and ability to perform functional tasks at home and in the community.    Baseline  6/19: see MMT    Time  3    Period  Weeks    Status  Achieved        PT Long Term Goals - 12/23/17 1432      PT LONG TERM GOAL #1   Title  Pt will have improved L knee AROM from 5-115deg or better in order to further decrease pain and improve gait and stair ambulation.    Baseline  6/19: 0-123deg     Time  6    Period  Weeks    Status  Achieved      PT LONG TERM GOAL #2   Title  Pt will have 1 grade improvement in MMT throughout all muscle groups tested in order to further maximize STS transfers, stairs, and overall gait.     Baseline  6/19: see MMT    Time  6    Period  Weeks  Status  Achieved      PT LONG TERM GOAL #3   Title  Pt will be able to perform 5xSTS in 12 sec or < with no UE and proper mechanics in order to demo improved overall functional strength.    Baseline  6/19: 7.6sec, proper mechanics    Time  6    Period  Weeks    Status  Achieved      PT LONG TERM GOAL #4   Title  Pt will be able to perform bil SLS for 10 sec or > with no UE in order to demo improved balance and to maximize gait and stair ambulation.    Baseline  6/19: 12 sec RLE, 30 sec LLE     Time  6    Period  Weeks    Status  Achieved      PT LONG TERM GOAL #5   Title  Pt will have improved 3MWT by 133f or > with LRAD and gait mechanics WFL and without increases in L knee pain in order to maximzie pt's community access.    Baseline  6/19: significant improvements in distance, still limited by RLE antalgia; no limitations and WFL for LLE    Time  6    Period  Weeks    Status  Partially Met      PT LONG TERM GOAL #6   Title  Pt will have improved L knee AROM from 5-120deg or better in order to further decrease pain and improve gait and stair ambulation.    Baseline  6/19: 0-123deg    Time  3    Period  Weeks    Status  Achieved            Plan - 12/23/17 1501    Clinical Impression Statement  Pt presents to therapy after having f/u with Dr. AWynelle Linkyesterday who cleared him to be discharged from PT and pt wishing to do so today. PT reassessed pt's goals and outcome measures today. He has made tremendous improvements throughout his ROM, MMT, functional strength, balance, and gait since his initial evaluation. Pt has met all STG and met all but 1 LTG, which was partially met. His AROM was 0-123deg this date and his MMT was at least a 4-5/5 throughout all muscle groups assessed. His main limitation at this point is his R knee, which he is set to replace in the near future as well. PT provided extensive strengthening HEP handout and pt verbalized understanding. At this point, pt is ready and safe for discharge to his HEP for his L TKA.    Rehab Potential  Good    PT Frequency  2x / week    PT Duration  2 weeks decreasing frequency to 2x/week for remainder of PT POC    PT Treatment/Interventions  ADLs/Self Care Home Management;Biofeedback;Cryotherapy;Electrical Stimulation;Moist Heat;Ultrasound;DME Instruction;Gait training;Stair training;Functional mobility training;Therapeutic activities;Therapeutic exercise;Balance training;Neuromuscular re-education;Patient/family  education;Manual techniques;Scar mobilization;Passive range of motion;Dry needling;Energy conservation;Taping;Compression bandaging    PT Next Visit Plan  discharged to HEP    PT Home Exercise Plan  eval: heel slides, quad set, supine HS stretch; sidelying abd; 6/10: SLR, fwd/lat step ups; TKE; 6/19: see below for extensive additions    Consulted and Agree with Plan of Care  Patient       Patient will benefit from skilled therapeutic intervention in order to improve the following deficits and impairments:     Visit Diagnosis: Stiffness of left knee, not elsewhere classified  Chronic  pain of left knee  Localized edema  Difficulty in walking, not elsewhere classified  Muscle weakness (generalized)     Problem List Patient Active Problem List   Diagnosis Date Noted  . OA (osteoarthritis) of knee 11/16/2017    PHYSICAL THERAPY DISCHARGE SUMMARY  Visits from Start of Care: 15  Current functional level related to goals / functional outcomes: See above   Remaining deficits: See above   Education / Equipment: HEP  Plan: Patient agrees to discharge.  Patient goals were met. Patient is being discharged due to meeting the stated rehab goals.  ?????        Geraldine Solar PT, [place] [address] [place], [place], [address] Phone: [phone]   Fax:  [phone]  Name: Jonathan Ochoa MRN: [id number] Date of Birth: Oct 20, 1950

## 2017-12-25 ENCOUNTER — Encounter (HOSPITAL_COMMUNITY): Payer: Medicare HMO

## 2017-12-28 ENCOUNTER — Encounter (HOSPITAL_COMMUNITY): Payer: Medicare HMO

## 2017-12-30 ENCOUNTER — Encounter (HOSPITAL_COMMUNITY): Payer: Medicare HMO

## 2018-01-01 ENCOUNTER — Encounter (HOSPITAL_COMMUNITY): Payer: Medicare HMO

## 2018-02-02 DIAGNOSIS — Z809 Family history of malignant neoplasm, unspecified: Secondary | ICD-10-CM | POA: Diagnosis not present

## 2018-02-02 DIAGNOSIS — Z7722 Contact with and (suspected) exposure to environmental tobacco smoke (acute) (chronic): Secondary | ICD-10-CM | POA: Diagnosis not present

## 2018-02-02 DIAGNOSIS — Z825 Family history of asthma and other chronic lower respiratory diseases: Secondary | ICD-10-CM | POA: Diagnosis not present

## 2018-02-02 DIAGNOSIS — Z823 Family history of stroke: Secondary | ICD-10-CM | POA: Diagnosis not present

## 2018-02-02 DIAGNOSIS — E039 Hypothyroidism, unspecified: Secondary | ICD-10-CM | POA: Diagnosis not present

## 2018-03-09 ENCOUNTER — Other Ambulatory Visit: Payer: Self-pay | Admitting: Orthopedic Surgery

## 2018-03-09 NOTE — Care Plan (Signed)
R TKA scheduled on 04-05-18 DCP:  Home with spouse.  1 story home with 4 ste. DME:  No needs.  Has a RW and 3-in-1. PT:  Forestine Na.  PT eval scheduled for 04-09-18 at 1:45 pm

## 2018-03-09 NOTE — H&P (Signed)
TOTAL KNEE ADMISSION H&P  Patient is being admitted for right total knee arthroplasty.  Subjective:  Chief Complaint:right knee pain.  HPI: Jonathan Ochoa, 67 y.o. male, has a history of pain and functional disability in the right knee due to arthritis and has failed non-surgical conservative treatments for greater than 12 weeks to includecorticosteriod injections, viscosupplementation injections and activity modification.  Onset of symptoms was abrupt, starting 1 years ago with gradually worsening course since that time. The patient noted no past surgery on the right knee(s).  Patient currently rates pain in the right knee(s) at 4 out of 10 with activity. Patient has pain that interferes with activities of daily living and instability and reduced ROM.  Patient has evidence of tri-compartmental bone-on-bone osteoarthritis by imaging studies. There is no active infection.  Patient Active Problem List   Diagnosis Date Noted  . OA (osteoarthritis) of knee 11/16/2017   Past Medical History:  Diagnosis Date  . Arthritis   . Hypothyroidism   . Thyroid disease     Past Surgical History:  Procedure Laterality Date  . adhesions of abdomen     from scar tissue from appendectomy  . AMPUTATION OF REPLICATED TOES     partial tip amputation of left big toe, and next toe   . APPENDECTOMY  1977  . arthroscpy      left knee -meniscus tear  . STERIOD INJECTION Right 11/16/2017   Procedure: CORTISONE  INJECTION RIGHT KNEE;  Surgeon: [name], MD;  Location: [facility];  Service: Orthopedics;  Laterality: Right;  . TOE SURGERY    . TOTAL KNEE ARTHROPLASTY Left 11/16/2017   Procedure: LEFT TOTAL KNEE ARTHROPLASTY;  Surgeon: [name], MD;  Location: [facility];  Service: Orthopedics;  Laterality: Left;    No current facility-administered medications for this encounter.    Current Outpatient Medications  Medication Sig Dispense Refill Last Dose  . aspirin EC 325 MG EC tablet Take 1 tablet (325  mg total) by mouth 2 (two) times daily. 30 tablet 0   . levothyroxine (SYNTHROID, LEVOTHROID) 137 MCG tablet Take 137 mcg by mouth daily before breakfast.   11/16/2017 at 0530  . methocarbamol (ROBAXIN) 500 MG tablet Take 1 tablet (500 mg total) by mouth every 6 (six) hours as needed for muscle spasms. 40 tablet 0   . Misc Natural Products (GLUCOSAMINE CHOND MSM FORMULA PO) Take 2 tablets by mouth every morning.   10+ days ago at Unknown time  . Multiple Vitamins-Minerals (MULTIVITAMIN WITH MINERALS) tablet Take 1 tablet by mouth daily.   10+ days ago at Unknown time  . naproxen sodium (ALEVE) 220 MG tablet Take 440 mg by mouth daily as needed.   10+ days ago at Unknown time  . Omega-3 Fatty Acids (FISH OIL) 1000 MG CAPS Take 1 capsule by mouth 2 (two) times a week.   10+ days ago at Unknown time  . oxyCODONE (OXY IR/ROXICODONE) 5 MG immediate release tablet Take 1-2 tablets (5-10 mg total) by mouth every 6 (six) hours as needed for moderate pain. 56 tablet 0   . saw palmetto 160 MG capsule Take 480 mg by mouth daily. 3 caps daily   10+ days ago at Unknown time  . traMADol (ULTRAM) 50 MG tablet Take 1-2 tablets (50-100 mg total) by mouth every 6 (six) hours as needed for moderate pain (if unresponsive to oxycodone). 40 tablet 0    No Known Allergies  Social History   Tobacco Use  . Smoking status:  Never Smoker  . Smokeless tobacco: Never Used  Substance Use Topics  . Alcohol use: No    No family history on file.   Review of Systems  Constitutional: Negative for chills and fever.  HENT: Negative for congestion, sore throat and tinnitus.   Eyes: Negative for double vision, photophobia and pain.  Respiratory: Negative for cough, shortness of breath and wheezing.   Cardiovascular: Negative for chest pain, palpitations and orthopnea.  Gastrointestinal: Negative for heartburn, nausea and vomiting.  Genitourinary: Negative for dysuria, frequency and urgency.  Musculoskeletal: Positive for joint  pain.  Neurological: Negative for dizziness, weakness and headaches.  Psychiatric/Behavioral: Negative for depression.    Objective:  Physical Exam  Well nourished and well developed. General: Alert and oriented x3, cooperative and pleasant, no acute distress. Head: normocephalic, atraumatic, neck supple. Eyes: EOMI. Respiratory: breath sounds clear in all fields, no wheezing, rales, or rhonchi. Cardiovascular: Regular rate and rhythm, no murmurs, gallops or rubs.  Abdomen: non-tender to palpation and soft, normoactive bowel sounds. Musculoskeletal: Antalgic gait to the right Right Knee Exam: No effusion. Range of motion is 5-135 degrees. There is no crepitus on range of motion of the knee. There is no medial or lateral joint line tenderness. There is no instability noted Calves soft and nontender. Motor function intact in LE. Strength 5/5 LE bilaterally. Neuro: Distal pulses 2+. Sensation to light touch intact in LE.  Vital signs in last 24 hours: Blood pressure: 126/80 mmHg   Labs:   Estimated body mass index is 29.01 kg/m as calculated from the following:   Height as of 11/16/17: 5\' 11"  (1.803 m).   Weight as of 11/16/17: 94.3 kg.   Imaging Review Plain radiographs demonstrate severe degenerative joint disease of the right knee(s). The overall alignment isneutral. The bone quality appears to be adequate for age and reported activity level.   Preoperative templating of the joint replacement has been completed, documented, and submitted to the Operating Room personnel in order to optimize intra-operative equipment management.   Anticipated LOS equal to or greater than 2 midnights due to - Age 27 and older with one or more of the following:  - Obesity  - Expected need for hospital services (PT, OT, Nursing) required for safe  discharge  - Anticipated need for postoperative skilled nursing care or inpatient rehab  - Active co-morbidities: None OR   - Unanticipated  findings during/Post Surgery: None  - Patient is a high risk of re-admission due to: None     Assessment/Plan:  End stage arthritis, right knee   The patient history, physical examination, clinical judgment of the provider and imaging studies are consistent with end stage degenerative joint disease of the right knee(s) and total knee arthroplasty is deemed medically necessary. The treatment options including medical management, injection therapy arthroscopy and arthroplasty were discussed at length. The risks and benefits of total knee arthroplasty were presented and reviewed. The risks due to aseptic loosening, infection, stiffness, patella tracking problems, thromboembolic complications and other imponderables were discussed. The patient acknowledged the explanation, agreed to proceed with the plan and consent was signed. Patient is being admitted for inpatient treatment for surgery, pain control, PT, OT, prophylactic antibiotics, VTE prophylaxis, progressive ambulation and ADL's and discharge planning. The patient is planning to be discharged to home with outpatient physical therapy.   Therapy Plans: outpatient therapy at [place] Disposition: Home with wife Planned DVT Prophylaxis: Aspirin 325 mg BID DME needed: None PCP: Dr. Gerarda Fraction (medical clearance provided)  TXA: IV Allergies: None  - Patient was instructed on what medications to stop prior to surgery. - Follow-up visit in 2 weeks with Dr. Wynelle Link - Begin physical therapy following surgery - Pre-operative lab work as pre-surgical testing - Prescriptions will be provided in hospital at time of discharge  Theresa Duty, PA-C Orthopedic Surgery [facility] [place]

## 2018-03-17 DIAGNOSIS — Z23 Encounter for immunization: Secondary | ICD-10-CM | POA: Diagnosis not present

## 2018-03-25 ENCOUNTER — Encounter (HOSPITAL_COMMUNITY): Payer: Self-pay

## 2018-03-25 NOTE — Patient Instructions (Signed)
Your procedure is scheduled on: Monday, Sept. 30, 2019   Surgery Time:  8:20AM-9:10AM   Report to [facility]  Entrance    Report to admitting at 5:45 AM   Call this number if you have problems the morning of surgery [phone]   Do not eat food or drink liquids :After Midnight.   Brush your teeth the morning of surgery.   Do NOT smoke after Midnight   Take these medicines the morning of surgery with A SIP OF WATER: Levothyroxine                               You may not have any metal on your body including  jewelry, and body piercings             Do not wear lotions, powders, perfumes/cologne, or deodorant                          Men may shave face and neck.   Do not bring valuables to the hospital. [place].   Contacts, dentures or bridgework may not be worn into surgery.   Leave suitcase in the car. After surgery it may be brought to your room.   Special Instructions: Bring a copy of your healthcare power of attorney and living will documents         the day of surgery if you haven't scanned them in before.              Please read over the following fact sheets you were given:  [facility] - Preparing for Surgery Before surgery, you can play an important role.  Because skin is not sterile, your skin needs to be as free of germs as possible.  You can reduce the number of germs on your skin by washing with CHG (chlorahexidine gluconate) soap before surgery.  CHG is an antiseptic cleaner which kills germs and bonds with the skin to continue killing germs even after washing. Please DO NOT use if you have an allergy to CHG or antibacterial soaps.  If your skin becomes reddened/irritated stop using the CHG and inform your nurse when you arrive at Short Stay. Do not shave (including legs and underarms) for at least 48 hours prior to the first CHG shower.  You may shave your face/neck.  Please follow these  instructions carefully:  1.  Shower with CHG Soap the night before surgery and the  morning of surgery.  2.  If you choose to wash your hair, wash your hair first as usual with your normal  shampoo.  3.  After you shampoo, rinse your hair and body thoroughly to remove the shampoo.                             4.  Use CHG as you would any other liquid soap.  You can apply chg directly to the skin and wash.  Gently with a scrungie or clean washcloth.  5.  Apply the CHG Soap to your body ONLY FROM THE NECK DOWN.   Do   not use on face/ open  Wound or open sores. Avoid contact with eyes, ears mouth and   genitals (private parts).                       Wash face,  Genitals (private parts) with your normal soap.             6.  Wash thoroughly, paying special attention to the area where your    surgery  will be performed.  7.  Thoroughly rinse your body with warm water from the neck down.  8.  DO NOT shower/wash with your normal soap after using and rinsing off the CHG Soap.                9.  Pat yourself dry with a clean towel.            10.  Wear clean pajamas.            11.  Place clean sheets on your bed the night of your first shower and do not  sleep with pets. Day of Surgery : Do not apply any lotions/deodorants the morning of surgery.  Please wear clean clothes to the hospital/surgery center.  FAILURE TO FOLLOW THESE INSTRUCTIONS MAY RESULT IN THE CANCELLATION OF YOUR SURGERY  PATIENT SIGNATURE_________________________________  NURSE SIGNATURE__________________________________  ________________________________________________________________________   Jonathan Ochoa  An incentive spirometer is a tool that can help keep your lungs clear and active. This tool measures how well you are filling your lungs with each breath. Taking long deep breaths may help reverse or decrease the chance of developing breathing (pulmonary) problems (especially infection)  following:  A long period of time when you are unable to move or be active. BEFORE THE PROCEDURE   If the spirometer includes an indicator to show your best effort, your nurse or respiratory therapist will set it to a desired goal.  If possible, sit up straight or lean slightly forward. Try not to slouch.  Hold the incentive spirometer in an upright position. INSTRUCTIONS FOR USE  1. Sit on the edge of your bed if possible, or sit up as far as you can in bed or on a chair. 2. Hold the incentive spirometer in an upright position. 3. Breathe out normally. 4. Place the mouthpiece in your mouth and seal your lips tightly around it. 5. Breathe in slowly and as deeply as possible, raising the piston or the ball toward the top of the column. 6. Hold your breath for 3-5 seconds or for as long as possible. Allow the piston or ball to fall to the bottom of the column. 7. Remove the mouthpiece from your mouth and breathe out normally. 8. Rest for a few seconds and repeat Steps 1 through 7 at least 10 times every 1-2 hours when you are awake. Take your time and take a few normal breaths between deep breaths. 9. The spirometer may include an indicator to show your best effort. Use the indicator as a goal to work toward during each repetition. 10. After each set of 10 deep breaths, practice coughing to be sure your lungs are clear. If you have an incision (the cut made at the time of surgery), support your incision when coughing by placing a pillow or rolled up towels firmly against it. Once you are able to get out of bed, walk around indoors and cough well. You may stop using the incentive spirometer when instructed by your caregiver.  RISKS AND COMPLICATIONS  Take your time  so you do not get dizzy or light-headed.  If you are in pain, you may need to take or ask for pain medication before doing incentive spirometry. It is harder to take a deep breath if you are having pain. AFTER USE  Rest and  breathe slowly and easily.  It can be helpful to keep track of a log of your progress. Your caregiver can provide you with a simple table to help with this. If you are using the spirometer at home, follow these instructions: [name] IF:   You are having difficultly using the spirometer.  You have trouble using the spirometer as often as instructed.  Your pain medication is not giving enough relief while using the spirometer.  You develop fever of 100.5 F (38.1 C) or higher. SEEK IMMEDIATE MEDICAL CARE IF:   You cough up bloody sputum that had not been present before.  You develop fever of 102 F (38.9 C) or greater.  You develop worsening pain at or near the incision site. MAKE SURE YOU:   Understand these instructions.  Will watch your condition.  Will get help right away if you are not doing well or get worse. Document Released: 11/03/2006 Document Revised: 09/15/2011 Document Reviewed: 01/04/2007 Exit[employer] Patient Information 2014 [employer], [place].   ________________________________________________________________________  WHAT IS A BLOOD TRANSFUSION? Blood Transfusion Information  A transfusion is the replacement of blood or some of its parts. Blood is made up of multiple cells which provide different functions.  Red blood cells carry oxygen and are used for blood loss replacement.  White blood cells fight against infection.  Platelets control bleeding.  Plasma helps clot blood.  Other blood products are available for specialized needs, such as hemophilia or other clotting disorders. BEFORE THE TRANSFUSION  Who gives blood for transfusions?   Healthy volunteers who are fully evaluated to make sure their blood is safe. This is blood bank blood. Transfusion therapy is the safest it has ever been in the practice of medicine. Before blood is taken from a donor, a complete history is taken to make sure that person has no history of diseases nor engages in  risky social behavior (examples are intravenous drug use or sexual activity with multiple partners). The donor's travel history is screened to minimize risk of transmitting infections, such as malaria. The donated blood is tested for signs of infectious diseases, such as HIV and hepatitis. The blood is then tested to be sure it is compatible with you in order to minimize the chance of a transfusion reaction. If you or a relative donates blood, this is often done in anticipation of surgery and is not appropriate for emergency situations. It takes many days to process the donated blood. RISKS AND COMPLICATIONS Although transfusion therapy is very safe and saves many lives, the main dangers of transfusion include:   Getting an infectious disease.  Developing a transfusion reaction. This is an allergic reaction to something in the blood you were given. Every precaution is taken to prevent this. The decision to have a blood transfusion has been considered carefully by your caregiver before blood is given. Blood is not given unless the benefits outweigh the risks. AFTER THE TRANSFUSION  Right after receiving a blood transfusion, you will usually feel much better and more energetic. This is especially true if your red blood cells have gotten low (anemic). The transfusion raises the level of the red blood cells which carry oxygen, and this usually causes an energy increase.  The  nurse administering the transfusion will monitor you carefully for complications. HOME CARE INSTRUCTIONS  No special instructions are needed after a transfusion. You may find your energy is better. Speak with your caregiver about any limitations on activity for underlying diseases you may have. SEEK MEDICAL CARE IF:   Your condition is not improving after your transfusion.  You develop redness or irritation at the intravenous (IV) site. SEEK IMMEDIATE MEDICAL CARE IF:  Any of the following symptoms occur over the next 12  hours:  Shaking chills.  You have a temperature by mouth above 102 F (38.9 C), not controlled by medicine.  Chest, back, or muscle pain.  People around you feel you are not acting correctly or are confused.  Shortness of breath or difficulty breathing.  Dizziness and fainting.  You get a rash or develop hives.  You have a decrease in urine output.  Your urine turns a dark color or changes to pink, red, or brown. Any of the following symptoms occur over the next 10 days:  You have a temperature by mouth above 102 F (38.9 C), not controlled by medicine.  Shortness of breath.  Weakness after normal activity.  The white part of the eye turns yellow (jaundice).  You have a decrease in the amount of urine or are urinating less often.  Your urine turns a dark color or changes to pink, red, or brown. Document Released: 06/20/2000 Document Revised: 09/15/2011 Document Reviewed: 02/07/2008 [facility] Patient Information 2014 [place], [place].  _______________________________________________________________________

## 2018-03-29 ENCOUNTER — Encounter (HOSPITAL_COMMUNITY): Payer: Self-pay

## 2018-03-29 ENCOUNTER — Other Ambulatory Visit: Payer: Self-pay

## 2018-03-29 ENCOUNTER — Encounter (HOSPITAL_COMMUNITY)
Admission: RE | Admit: 2018-03-29 | Discharge: 2018-03-29 | Disposition: A | Discharge status: Home / Self Care | Payer: Medicare HMO | Source: Ambulatory Visit | Attending: Orthopedic Surgery | Admitting: Orthopedic Surgery

## 2018-03-29 DIAGNOSIS — Z01812 Encounter for preprocedural laboratory examination: Secondary | ICD-10-CM | POA: Diagnosis not present

## 2018-03-29 DIAGNOSIS — Z79899 Other long term (current) drug therapy: Secondary | ICD-10-CM | POA: Diagnosis not present

## 2018-03-29 DIAGNOSIS — Z79891 Long term (current) use of opiate analgesic: Secondary | ICD-10-CM | POA: Insufficient documentation

## 2018-03-29 DIAGNOSIS — M1711 Unilateral primary osteoarthritis, right knee: Secondary | ICD-10-CM | POA: Diagnosis not present

## 2018-03-29 DIAGNOSIS — Z7982 Long term (current) use of aspirin: Secondary | ICD-10-CM | POA: Diagnosis not present

## 2018-03-29 HISTORY — DX: Thyrotoxicosis, unspecified without thyrotoxic crisis or storm: E05.90

## 2018-03-29 LAB — COMPREHENSIVE METABOLIC PANEL
ALT: 20 U/L (ref 0–44)
ANION GAP: 9 (ref 5–15)
AST: 28 U/L (ref 15–41)
Albumin: 3.8 g/dL (ref 3.5–5.0)
Alkaline Phosphatase: 93 U/L (ref 38–126)
BILIRUBIN TOTAL: 0.7 mg/dL (ref 0.3–1.2)
BUN: 21 mg/dL (ref 8–23)
CALCIUM: 9.2 mg/dL (ref 8.9–10.3)
CO2: 24 mmol/L (ref 22–32)
Chloride: 107 mmol/L (ref 98–111)
Creatinine, Ser: 0.77 mg/dL (ref 0.61–1.24)
GFR calc Af Amer: >60 mL/min (ref >60)
Glucose, Bld: 85 mg/dL (ref 70–99)
POTASSIUM: 4.4 mmol/L (ref 3.5–5.1)
Sodium: 140 mmol/L (ref 135–145)
TOTAL PROTEIN: 7.5 g/dL (ref 6.5–8.1)

## 2018-03-29 LAB — CBC
HEMATOCRIT: 40.5 % (ref 39.0–52.0)
Hemoglobin: 13.1 g/dL (ref 13.0–17.0)
MCH: 29.5 pg (ref 26.0–34.0)
MCHC: 32.3 g/dL (ref 30.0–36.0)
MCV: 91.2 fL (ref 78.0–100.0)
Platelets: 271 10*3/uL (ref 150–400)
RBC: 4.44 MIL/uL (ref 4.22–5.81)
RDW: 13 % (ref 11.5–15.5)
WBC: 7.2 10*3/uL (ref 4.0–10.5)

## 2018-03-29 LAB — SURGICAL PCR SCREEN
MRSA, PCR: NEGATIVE
Staphylococcus aureus: NEGATIVE

## 2018-03-29 LAB — APTT: APTT: 28 s (ref 24–36)

## 2018-03-29 LAB — PROTIME-INR
INR: 0.98
PROTHROMBIN TIME: 12.9 s (ref 11.4–15.2)

## 2018-04-03 NOTE — Anesthesia Preprocedure Evaluation (Signed)
Anesthesia Evaluation  Patient identified by MRN, date of birth, ID band Patient awake    Reviewed: Allergy & Precautions, NPO status , Patient's Chart, lab work & pertinent test results  History of Anesthesia Complications Negative for: history of anesthetic complications  Airway Mallampati: II  TM Distance: >3 FB Neck ROM: Full    Dental  (+) Upper Dentures, Missing,    Pulmonary neg pulmonary ROS,    breath sounds clear to auscultation       Cardiovascular negative cardio ROS   Rhythm:Regular     Neuro/Psych negative neurological ROS  negative psych ROS   GI/Hepatic negative GI ROS, Neg liver ROS,   Endo/Other  Hypothyroidism   Renal/GU negative Renal ROS     Musculoskeletal  (+) Arthritis ,   Abdominal   Peds  Hematology negative hematology ROS (+)   Anesthesia Other Findings   Reproductive/Obstetrics                             Lab Results  Component Value Date   WBC 7.2 03/29/2018   HGB 13.1 03/29/2018   HCT 40.5 03/29/2018   MCV 91.2 03/29/2018   PLT 271 03/29/2018   Lab Results  Component Value Date   CREATININE 0.77 03/29/2018   BUN 21 03/29/2018   NA 140 03/29/2018   K 4.4 03/29/2018   CL 107 03/29/2018   CO2 24 03/29/2018   Lab Results  Component Value Date   INR 0.98 03/29/2018   INR 1.07 11/05/2017    Anesthesia Physical  Anesthesia Plan  ASA: II  Anesthesia Plan: MAC, Spinal and Regional   Post-op Pain Management:    Induction:   PONV Risk Score and Plan: 1 and Treatment may vary due to age or medical condition  Airway Management Planned: Nasal Cannula  Additional Equipment: None  Intra-op Plan:   Post-operative Plan:   Informed Consent: I have reviewed the patients History and Physical, chart, labs and discussed the procedure including the risks, benefits and alternatives for the proposed anesthesia with the patient or authorized  representative who has indicated his/her understanding and acceptance.   Dental advisory given  Plan Discussed with: CRNA and Surgeon  Anesthesia Plan Comments:         Anesthesia Quick Evaluation

## 2018-04-04 MED ORDER — BUPIVACAINE LIPOSOME 1.3 % IJ SUSP
20.0000 mL | Freq: Once | INTRAMUSCULAR | Status: DC
  Filled 2018-04-04: qty 20

## 2018-04-05 ENCOUNTER — Encounter (HOSPITAL_COMMUNITY): Payer: Self-pay | Admitting: *Deleted

## 2018-04-05 ENCOUNTER — Inpatient Hospital Stay (HOSPITAL_COMMUNITY)
Admission: RE | Admit: 2018-04-05 | Discharge: 2018-04-06 | DRG: 470 | Disposition: A | Discharge status: Home / Self Care | Payer: Medicare HMO | Attending: Orthopedic Surgery | Admitting: Orthopedic Surgery

## 2018-04-05 ENCOUNTER — Inpatient Hospital Stay (HOSPITAL_COMMUNITY): Payer: Medicare HMO | Admitting: Anesthesiology

## 2018-04-05 ENCOUNTER — Encounter (HOSPITAL_COMMUNITY)
Admission: RE | Disposition: A | Discharge status: Home / Self Care | Payer: Self-pay | Source: Home / Self Care | Attending: Orthopedic Surgery

## 2018-04-05 ENCOUNTER — Other Ambulatory Visit: Payer: Self-pay

## 2018-04-05 DIAGNOSIS — G8918 Other acute postprocedural pain: Secondary | ICD-10-CM | POA: Diagnosis not present

## 2018-04-05 DIAGNOSIS — Z79899 Other long term (current) drug therapy: Secondary | ICD-10-CM

## 2018-04-05 DIAGNOSIS — Z79890 Hormone replacement therapy (postmenopausal): Secondary | ICD-10-CM

## 2018-04-05 DIAGNOSIS — Z7982 Long term (current) use of aspirin: Secondary | ICD-10-CM | POA: Diagnosis not present

## 2018-04-05 DIAGNOSIS — M1711 Unilateral primary osteoarthritis, right knee: Principal | ICD-10-CM | POA: Diagnosis present

## 2018-04-05 DIAGNOSIS — Z96652 Presence of left artificial knee joint: Secondary | ICD-10-CM | POA: Diagnosis not present

## 2018-04-05 DIAGNOSIS — E039 Hypothyroidism, unspecified: Secondary | ICD-10-CM | POA: Diagnosis present

## 2018-04-05 DIAGNOSIS — M179 Osteoarthritis of knee, unspecified: Secondary | ICD-10-CM | POA: Diagnosis present

## 2018-04-05 DIAGNOSIS — M1710 Unilateral primary osteoarthritis, unspecified knee: Secondary | ICD-10-CM | POA: Diagnosis present

## 2018-04-05 HISTORY — PX: TOTAL KNEE ARTHROPLASTY: SHX125

## 2018-04-05 LAB — TYPE AND SCREEN
ABO/RH(D): A POS
ANTIBODY SCREEN: NEGATIVE

## 2018-04-05 SURGERY — ARTHROPLASTY, KNEE, TOTAL
Anesthesia: Monitor Anesthesia Care | Site: Knee | Laterality: Right

## 2018-04-05 MED ORDER — PHENOL 1.4 % MT LIQD: 1.0000 | OROMUCOSAL | Status: DC | PRN

## 2018-04-05 MED ORDER — MIDAZOLAM HCL 2 MG/2ML IJ SOLN
1.0000 mg | INTRAMUSCULAR | Status: DC
  Administered 2018-04-05: 1 mg via INTRAVENOUS

## 2018-04-05 MED ORDER — OXYCODONE HCL 5 MG PO TABS
5.0000 mg | ORAL_TABLET | ORAL | Status: DC | PRN
  Administered 2018-04-05 – 2018-04-06 (×5): 10 mg via ORAL
  Filled 2018-04-05 (×5): qty 2

## 2018-04-05 MED ORDER — HYDROMORPHONE HCL 1 MG/ML IJ SOLN: 0.5000 mg | INTRAMUSCULAR | Status: DC | PRN

## 2018-04-05 MED ORDER — DEXAMETHASONE SODIUM PHOSPHATE 10 MG/ML IJ SOLN
10.0000 mg | Freq: Once | INTRAMUSCULAR | Status: AC
  Administered 2018-04-06: 10 mg via INTRAVENOUS
  Filled 2018-04-05 (×2): qty 1

## 2018-04-05 MED ORDER — DOCUSATE SODIUM 100 MG PO CAPS
100.0000 mg | ORAL_CAPSULE | Freq: Two times a day (BID) | ORAL | Status: DC
  Administered 2018-04-05 – 2018-04-06 (×2): 100 mg via ORAL
  Filled 2018-04-05 (×2): qty 1

## 2018-04-05 MED ORDER — ONDANSETRON HCL 4 MG/2ML IJ SOLN: 4.0000 mg | Freq: Four times a day (QID) | INTRAMUSCULAR | Status: DC | PRN

## 2018-04-05 MED ORDER — FENTANYL CITRATE (PF) 100 MCG/2ML IJ SOLN
50.0000 ug | INTRAMUSCULAR | Status: DC
  Administered 2018-04-05: 50 ug via INTRAVENOUS

## 2018-04-05 MED ORDER — PROPOFOL 10 MG/ML IV BOLUS
INTRAVENOUS | Status: AC
  Filled 2018-04-05: qty 40

## 2018-04-05 MED ORDER — BUPIVACAINE LIPOSOME 1.3 % IJ SUSP
INTRAMUSCULAR | Status: DC | PRN
  Administered 2018-04-05: 20 mL

## 2018-04-05 MED ORDER — ACETAMINOPHEN 10 MG/ML IV SOLN
1000.0000 mg | Freq: Four times a day (QID) | INTRAVENOUS | Status: DC
  Administered 2018-04-05: 1000 mg via INTRAVENOUS
  Filled 2018-04-05 (×2): qty 100

## 2018-04-05 MED ORDER — CLONIDINE HCL (ANALGESIA) 100 MCG/ML EP SOLN
EPIDURAL | Status: DC | PRN
  Administered 2018-04-05: 50 ug

## 2018-04-05 MED ORDER — BISACODYL 10 MG RE SUPP: 10.0000 mg | Freq: Every day | RECTAL | Status: DC | PRN

## 2018-04-05 MED ORDER — DEXAMETHASONE SODIUM PHOSPHATE 10 MG/ML IJ SOLN: 8.0000 mg | Freq: Once | INTRAMUSCULAR | Status: DC

## 2018-04-05 MED ORDER — TRANEXAMIC ACID 1000 MG/10ML IV SOLN
1000.0000 mg | Freq: Once | INTRAVENOUS | Status: AC
  Administered 2018-04-05: 1000 mg via INTRAVENOUS
  Filled 2018-04-05: qty 1000

## 2018-04-05 MED ORDER — ONDANSETRON HCL 4 MG/2ML IJ SOLN: 4.0000 mg | Freq: Once | INTRAMUSCULAR | Status: DC | PRN

## 2018-04-05 MED ORDER — SODIUM CHLORIDE 0.9 % IJ SOLN
INTRAMUSCULAR | Status: AC
  Filled 2018-04-05: qty 50

## 2018-04-05 MED ORDER — BUPIVACAINE HCL (PF) 0.25 % IJ SOLN
INTRAMUSCULAR | Status: AC
  Filled 2018-04-05: qty 30

## 2018-04-05 MED ORDER — ROPIVACAINE HCL 7.5 MG/ML IJ SOLN
INTRAMUSCULAR | Status: DC | PRN
  Administered 2018-04-05: 20 mL via PERINEURAL

## 2018-04-05 MED ORDER — CHLORHEXIDINE GLUCONATE 4 % EX LIQD: 60.0000 mL | Freq: Once | CUTANEOUS | Status: DC

## 2018-04-05 MED ORDER — METOCLOPRAMIDE HCL 5 MG PO TABS: 5.0000 mg | ORAL_TABLET | Freq: Three times a day (TID) | ORAL | Status: DC | PRN

## 2018-04-05 MED ORDER — FENTANYL CITRATE (PF) 100 MCG/2ML IJ SOLN
INTRAMUSCULAR | Status: AC
  Filled 2018-04-05: qty 2

## 2018-04-05 MED ORDER — DIPHENHYDRAMINE HCL 12.5 MG/5ML PO ELIX
12.5000 mg | ORAL_SOLUTION | ORAL | Status: DC | PRN
  Administered 2018-04-06: 25 mg via ORAL
  Filled 2018-04-05: qty 10

## 2018-04-05 MED ORDER — SODIUM CHLORIDE 0.9 % IR SOLN
Status: DC | PRN
  Administered 2018-04-05: 1000 mL

## 2018-04-05 MED ORDER — MIDAZOLAM HCL 2 MG/2ML IJ SOLN
INTRAMUSCULAR | Status: AC
  Administered 2018-04-05: 1 mg via INTRAVENOUS
  Filled 2018-04-05: qty 2

## 2018-04-05 MED ORDER — LACTATED RINGERS IV SOLN
INTRAVENOUS | Status: DC
  Administered 2018-04-05 (×2): via INTRAVENOUS

## 2018-04-05 MED ORDER — MENTHOL 3 MG MT LOZG: 1.0000 | LOZENGE | OROMUCOSAL | Status: DC | PRN

## 2018-04-05 MED ORDER — MIDAZOLAM HCL 5 MG/5ML IJ SOLN
INTRAMUSCULAR | Status: DC | PRN
  Administered 2018-04-05: 1 mg via INTRAVENOUS

## 2018-04-05 MED ORDER — PROPOFOL 10 MG/ML IV BOLUS
INTRAVENOUS | Status: AC
  Filled 2018-04-05: qty 20

## 2018-04-05 MED ORDER — LEVOTHYROXINE SODIUM 137 MCG PO TABS
137.0000 ug | ORAL_TABLET | Freq: Every day | ORAL | Status: DC
  Administered 2018-04-06: 137 ug via ORAL
  Filled 2018-04-05: qty 1

## 2018-04-05 MED ORDER — METHOCARBAMOL 500 MG PO TABS
500.0000 mg | ORAL_TABLET | Freq: Four times a day (QID) | ORAL | Status: DC | PRN
  Administered 2018-04-05 – 2018-04-06 (×4): 500 mg via ORAL
  Filled 2018-04-05 (×4): qty 1

## 2018-04-05 MED ORDER — FLEET ENEMA 7-19 GM/118ML RE ENEM: 1.0000 | ENEMA | Freq: Once | RECTAL | Status: DC | PRN

## 2018-04-05 MED ORDER — SODIUM CHLORIDE 0.9 % IJ SOLN
INTRAMUSCULAR | Status: DC | PRN
  Administered 2018-04-05: 60 mL via INTRAVENOUS

## 2018-04-05 MED ORDER — EPHEDRINE SULFATE 50 MG/ML IJ SOLN
INTRAMUSCULAR | Status: DC | PRN
  Administered 2018-04-05 (×2): 5 mg via INTRAVENOUS

## 2018-04-05 MED ORDER — MIDAZOLAM HCL 2 MG/2ML IJ SOLN
INTRAMUSCULAR | Status: AC
  Filled 2018-04-05: qty 2

## 2018-04-05 MED ORDER — ONDANSETRON HCL 4 MG PO TABS: 4.0000 mg | ORAL_TABLET | Freq: Four times a day (QID) | ORAL | Status: DC | PRN

## 2018-04-05 MED ORDER — ONDANSETRON HCL 4 MG/2ML IJ SOLN
INTRAMUSCULAR | Status: DC | PRN
  Administered 2018-04-05: 4 mg via INTRAVENOUS

## 2018-04-05 MED ORDER — SODIUM CHLORIDE 0.9 % IV SOLN
INTRAVENOUS | Status: DC
  Administered 2018-04-05 (×2): via INTRAVENOUS

## 2018-04-05 MED ORDER — POLYETHYLENE GLYCOL 3350 17 G PO PACK: 17.0000 g | PACK | Freq: Every day | ORAL | Status: DC | PRN

## 2018-04-05 MED ORDER — METHOCARBAMOL 500 MG IVPB - SIMPLE MED
500.0000 mg | Freq: Four times a day (QID) | INTRAVENOUS | Status: DC | PRN
  Filled 2018-04-05: qty 50

## 2018-04-05 MED ORDER — ACETAMINOPHEN 500 MG PO TABS
1000.0000 mg | ORAL_TABLET | Freq: Four times a day (QID) | ORAL | Status: AC
  Administered 2018-04-05 – 2018-04-06 (×4): 1000 mg via ORAL
  Filled 2018-04-05 (×4): qty 2

## 2018-04-05 MED ORDER — TRAMADOL HCL 50 MG PO TABS: 50.0000 mg | ORAL_TABLET | Freq: Four times a day (QID) | ORAL | Status: DC | PRN

## 2018-04-05 MED ORDER — ONDANSETRON HCL 4 MG/2ML IJ SOLN
INTRAMUSCULAR | Status: AC
  Filled 2018-04-05: qty 2

## 2018-04-05 MED ORDER — GABAPENTIN 300 MG PO CAPS
300.0000 mg | ORAL_CAPSULE | Freq: Once | ORAL | Status: AC
  Administered 2018-04-05: 300 mg via ORAL
  Filled 2018-04-05: qty 1

## 2018-04-05 MED ORDER — EPHEDRINE 5 MG/ML INJ
INTRAVENOUS | Status: AC
  Filled 2018-04-05: qty 10

## 2018-04-05 MED ORDER — TRANEXAMIC ACID 1000 MG/10ML IV SOLN
1000.0000 mg | INTRAVENOUS | Status: AC
  Administered 2018-04-05: 1000 mg via INTRAVENOUS
  Filled 2018-04-05 (×2): qty 10

## 2018-04-05 MED ORDER — METOCLOPRAMIDE HCL 5 MG/ML IJ SOLN: 5.0000 mg | Freq: Three times a day (TID) | INTRAMUSCULAR | Status: DC | PRN

## 2018-04-05 MED ORDER — CEFAZOLIN SODIUM-DEXTROSE 2-4 GM/100ML-% IV SOLN
2.0000 g | Freq: Four times a day (QID) | INTRAVENOUS | Status: AC
  Administered 2018-04-05 (×2): 2 g via INTRAVENOUS
  Filled 2018-04-05 (×2): qty 100

## 2018-04-05 MED ORDER — STERILE WATER FOR IRRIGATION IR SOLN
Status: DC | PRN
  Administered 2018-04-05: 1000 mL

## 2018-04-05 MED ORDER — ASPIRIN EC 325 MG PO TBEC
325.0000 mg | DELAYED_RELEASE_TABLET | Freq: Two times a day (BID) | ORAL | Status: DC
  Administered 2018-04-06: 325 mg via ORAL
  Filled 2018-04-05: qty 1

## 2018-04-05 MED ORDER — CEFAZOLIN SODIUM-DEXTROSE 2-4 GM/100ML-% IV SOLN
2.0000 g | INTRAVENOUS | Status: AC
  Administered 2018-04-05: 2 g via INTRAVENOUS
  Filled 2018-04-05: qty 100

## 2018-04-05 MED ORDER — FENTANYL CITRATE (PF) 100 MCG/2ML IJ SOLN: 25.0000 ug | INTRAMUSCULAR | Status: DC | PRN

## 2018-04-05 MED ORDER — FENTANYL CITRATE (PF) 100 MCG/2ML IJ SOLN
INTRAMUSCULAR | Status: AC
  Administered 2018-04-05: 50 ug via INTRAVENOUS
  Filled 2018-04-05: qty 2

## 2018-04-05 MED ORDER — BUPIVACAINE IN DEXTROSE 0.75-8.25 % IT SOLN
INTRATHECAL | Status: DC | PRN
  Administered 2018-04-05: 1.6 mL via INTRATHECAL

## 2018-04-05 MED ORDER — PROPOFOL 500 MG/50ML IV EMUL
INTRAVENOUS | Status: DC | PRN
  Administered 2018-04-05: 75 ug/kg/min via INTRAVENOUS

## 2018-04-05 MED ORDER — SODIUM CHLORIDE 0.9 % IJ SOLN
INTRAMUSCULAR | Status: AC
  Filled 2018-04-05: qty 10

## 2018-04-05 SURGICAL SUPPLY — 57 items
ATTUNE MED DOME PAT 41 KNEE (Knees) ×2 IMPLANT
ATTUNE PS FEM RT SZ 7 CEM KNEE (Femur) ×2 IMPLANT
ATTUNE PSRP INSR SZ7 8 KNEE (Insert) ×2 IMPLANT
BAG ZIPLOCK 12X15 (MISCELLANEOUS) ×2 IMPLANT
BANDAGE ACE 6X5 VEL STRL LF (GAUZE/BANDAGES/DRESSINGS) ×2 IMPLANT
BASE TIBIAL ROT PLAT SZ 8 KNEE (Knees) ×1 IMPLANT
BLADE SAG 18X100X1.27 (BLADE) ×2 IMPLANT
BLADE SAW SGTL 11.0X1.19X90.0M (BLADE) ×2 IMPLANT
BOWL SMART MIX CTS (DISPOSABLE) ×2 IMPLANT
CEMENT HV SMART SET (Cement) ×4 IMPLANT
COVER SURGICAL LIGHT HANDLE (MISCELLANEOUS) ×2 IMPLANT
CUFF TOURN SGL QUICK 34 (TOURNIQUET CUFF) ×1
CUFF TRNQT CYL 34X4X40X1 (TOURNIQUET CUFF) ×1 IMPLANT
DECANTER SPIKE VIAL GLASS SM (MISCELLANEOUS) ×2 IMPLANT
DRAPE U-SHAPE 47X51 STRL (DRAPES) ×2 IMPLANT
DRSG ADAPTIC 3X8 NADH LF (GAUZE/BANDAGES/DRESSINGS) ×2 IMPLANT
DRSG EMULSION OIL 3X16 NADH (GAUZE/BANDAGES/DRESSINGS) ×2 IMPLANT
DRSG PAD ABDOMINAL 8X10 ST (GAUZE/BANDAGES/DRESSINGS) ×2 IMPLANT
DURAPREP 26ML APPLICATOR (WOUND CARE) ×2 IMPLANT
ELECT REM PT RETURN 15FT ADLT (MISCELLANEOUS) ×2 IMPLANT
EVACUATOR 1/8 PVC DRAIN (DRAIN) ×2 IMPLANT
GAUZE SPONGE 4X4 12PLY STRL (GAUZE/BANDAGES/DRESSINGS) ×2 IMPLANT
GLOVE BIO SURGEON STRL SZ7 (GLOVE) ×2 IMPLANT
GLOVE BIO SURGEON STRL SZ8 (GLOVE) ×2 IMPLANT
GLOVE BIOGEL PI IND STRL 6.5 (GLOVE) ×1 IMPLANT
GLOVE BIOGEL PI IND STRL 7.0 (GLOVE) ×1 IMPLANT
GLOVE BIOGEL PI IND STRL 8 (GLOVE) ×1 IMPLANT
GLOVE BIOGEL PI INDICATOR 6.5 (GLOVE) ×1
GLOVE BIOGEL PI INDICATOR 7.0 (GLOVE) ×1
GLOVE BIOGEL PI INDICATOR 8 (GLOVE) ×1
GLOVE SURG SS PI 6.5 STRL IVOR (GLOVE) ×2 IMPLANT
GOWN STRL REUS W/TWL LRG LVL3 (GOWN DISPOSABLE) ×4 IMPLANT
GOWN STRL REUS W/TWL XL LVL3 (GOWN DISPOSABLE) ×4 IMPLANT
HANDPIECE INTERPULSE COAX TIP (DISPOSABLE) ×1
HOLDER FOLEY CATH W/STRAP (MISCELLANEOUS) IMPLANT
IMMOBILIZER KNEE 20 (SOFTGOODS) ×4 IMPLANT
IMMOBILIZER KNEE 20 THIGH 36 (SOFTGOODS) ×1 IMPLANT
MANIFOLD NEPTUNE II (INSTRUMENTS) ×2 IMPLANT
NS IRRIG 1000ML POUR BTL (IV SOLUTION) ×2 IMPLANT
PACK TOTAL KNEE CUSTOM (KITS) ×2 IMPLANT
PAD ABD 7.5X8 STRL (GAUZE/BANDAGES/DRESSINGS) ×2 IMPLANT
PADDING CAST COTTON 6X4 STRL (CAST SUPPLIES) ×2 IMPLANT
PIN STEINMAN FIXATION KNEE (PIN) ×4 IMPLANT
PIN THREADED HEADED SIGMA (PIN) ×4 IMPLANT
POSITIONER SURGICAL ARM (MISCELLANEOUS) ×2 IMPLANT
SET HNDPC FAN SPRY TIP SCT (DISPOSABLE) ×1 IMPLANT
STRIP CLOSURE SKIN 1/2X4 (GAUZE/BANDAGES/DRESSINGS) ×2 IMPLANT
SUT MNCRL AB 4-0 PS2 18 (SUTURE) ×2 IMPLANT
SUT STRATAFIX 0 PDS 27 VIOLET (SUTURE) ×2
SUT VIC AB 2-0 CT1 27 (SUTURE) ×3
SUT VIC AB 2-0 CT1 TAPERPNT 27 (SUTURE) ×3 IMPLANT
SUTURE STRATFX 0 PDS 27 VIOLET (SUTURE) ×1 IMPLANT
TIBIAL BASE ROT PLAT SZ 8 KNEE (Knees) ×2 IMPLANT
TRAY FOLEY MTR SLVR 16FR STAT (SET/KITS/TRAYS/PACK) ×2 IMPLANT
WATER STERILE IRR 1000ML POUR (IV SOLUTION) ×4 IMPLANT
WRAP KNEE MAXI GEL POST OP (GAUZE/BANDAGES/DRESSINGS) ×2 IMPLANT
YANKAUER SUCT BULB TIP 10FT TU (MISCELLANEOUS) ×2 IMPLANT

## 2018-04-05 NOTE — Interval H&P Note (Signed)
History and Physical Interval Note:  04/05/2018 6:33 AM  Jonathan Ochoa  has presented today for surgery, with the diagnosis of right knee osteoarthritis  The various methods of treatment have been discussed with the patient and family. After consideration of risks, benefits and other options for treatment, the patient has consented to  Procedure(s): RIGHT TOTAL KNEE ARTHROPLASTY (Right) as a surgical intervention .  The patient's history has been reviewed, patient examined, no change in status, stable for surgery.  I have reviewed the patient's chart and labs.  Questions were answered to the patient's satisfaction.     Pilar Plate Vickii Volland

## 2018-04-05 NOTE — Discharge Instructions (Signed)

## 2018-04-05 NOTE — Anesthesia Procedure Notes (Signed)
Date/Time: 04/05/2018 8:09 AM Performed by: Glory Buff, CRNA Oxygen Delivery Method: Simple face mask

## 2018-04-05 NOTE — Evaluation (Signed)
Physical Therapy Evaluation Patient Details Name: Jonathan Ochoa MRN: [id number] DOB: 1951-04-08 Today's Date: 04/05/2018   History of Present Illness  RTKA, LTKA 5/19  Clinical Impression  The patient ambulated x 200'. Proud that the right leg is responding and supportive. Pt admitted with above diagnosis. Pt currently with functional limitations due to the deficits listed below (see PT Problem List). Pt will benefit from skilled PT to increase their independence and safety with mobility to allow discharge to the venue listed below.       Follow Up Recommendations Outpatient PT;Follow surgeon's recommendation for DC plan and follow-up therapies    Equipment Recommendations  None recommended by PT    Recommendations for Other Services       Precautions / Restrictions Precautions Precautions: Knee;Fall Required Braces or Orthoses: Knee Immobilizer - Right Knee Immobilizer - Right: Discontinue once straight leg raise with < 10 degree lag      Mobility  Bed Mobility Overal bed mobility: Needs Assistance Bed Mobility: Supine to Sit;Sit to Supine     Supine to sit: Min guard Sit to supine: Min guard   General bed mobility comments: self assisting the right leg.  Transfers Overall transfer level: Needs assistance Equipment used: Rolling walker (2 wheeled) Transfers: Sit to/from Stand              Ambulation/Gait Ambulation/Gait assistance: Min Web designer (Feet): 200 Feet Assistive device: Rolling walker (2 wheeled) Gait Pattern/deviations: Step-through pattern     General Gait Details: patient pleased that right leg is awake and can bear weight.   Stairs            Wheelchair Mobility    Modified [name] (Stroke Patients Only)       Balance                                             Pertinent Vitals/Pain Pain Assessment: 0-10 Pain Score: 2  Pain Location: right knee Pain Descriptors / Indicators:  Discomfort Pain Intervention(s): Monitored during session;Premedicated before session;Ice applied    Home Living Family/patient expects to be discharged to:: Private residence Living Arrangements: Spouse/significant other Available Help at Discharge: Family Type of Home: House Home Access: Stairs to enter Entrance Stairs-Rails: Psychiatric nurse of Steps: 3 Home Layout: One level Home Equipment: Clinical cytogeneticist - 2 wheels      Prior Function Level of Independence: Independent               Hand Dominance        Extremity/Trunk Assessment   Upper Extremity Assessment Upper Extremity Assessment: Overall WFL for tasks assessed    Lower Extremity Assessment Lower Extremity Assessment: RLE deficits/detail RLE Deficits / Details: able to raise leg from bed.    Cervical / Trunk Assessment Cervical / Trunk Assessment: Normal  Communication   Communication: No difficulties  Cognition Arousal/Alertness: Awake/alert Behavior During Therapy: WFL for tasks assessed/performed Overall Cognitive Status: Within Functional Limits for tasks assessed                                        General Comments      Exercises     Assessment/Plan    PT Assessment Patient needs continued PT services  PT Problem List Decreased strength;Decreased activity  tolerance;Decreased mobility;Decreased range of motion;Decreased knowledge of use of DME;Decreased safety awareness;Decreased knowledge of precautions;Pain       PT Treatment Interventions DME instruction;Therapeutic exercise;Gait training;Functional mobility training;Therapeutic activities;Patient/family education;Cognitive remediation;Stair training    PT Goals (Current goals can be found in the Care Plan section)  Acute Rehab PT Goals Patient Stated Goal: to go home PT Goal Formulation: With patient Time For Goal Achievement: 04/08/18 Potential to Achieve Goals: Good    Frequency  7X/week   Barriers to discharge        Co-evaluation               AM-PAC PT "6 Clicks" Daily Activity  Outcome Measure Difficulty turning over in bed (including adjusting bedclothes, sheets and blankets)?: A Little Difficulty moving from lying on back to sitting on the side of the bed? : A Little Difficulty sitting down on and standing up from a chair with arms (e.g., wheelchair, bedside commode, etc,.)?: A Little Help needed moving to and from a bed to chair (including a wheelchair)?: A Little Help needed walking in hospital room?: A Lot Help needed climbing 3-5 steps with a railing? : Total 6 Click Score: 15    End of Session Equipment Utilized During Treatment: Gait belt;Right knee immobilizer Activity Tolerance: Patient tolerated treatment well Patient left: in bed;with call bell/phone within reach;with family/visitor present Nurse Communication: Mobility status PT Visit Diagnosis: Unsteadiness on feet (R26.81)    Time: 8916-9450 PT Time Calculation (min) (ACUTE ONLY): 29 min   Charges:     PT Treatments $Gait Training: 8-22 mins        Tresa Endo PT Acute Rehabilitation Services Pager [phone] Office [phone]   Claretha Cooper 04/05/2018, 5:06 PM

## 2018-04-05 NOTE — Plan of Care (Signed)

## 2018-04-05 NOTE — Anesthesia Procedure Notes (Addendum)
Spinal  Start time: 04/05/2018 8:17 AM End time: 04/05/2018 8:22 AM Staffing Anesthesiologist: Suzette Battiest, MD Performed: anesthesiologist  Preanesthetic Checklist Completed: patient identified, site marked, surgical consent, pre-op evaluation, timeout performed, IV checked, risks and benefits discussed and monitors and equipment checked Spinal Block Patient position: sitting Prep: site prepped and draped and DuraPrep Patient monitoring: blood pressure, continuous pulse ox and heart rate Approach: midline Location: L4-5 Injection technique: single-shot Needle Needle type: Pencan  Needle gauge: 24 G Needle length: 9 cm

## 2018-04-05 NOTE — Op Note (Signed)
OPERATIVE REPORT-TOTAL KNEE ARTHROPLASTY   Pre-operative diagnosis- Osteoarthritis  Right knee(s)  Post-operative diagnosis- Osteoarthritis Right knee(s)  Procedure-  Right  Total Knee Arthroplasty  Surgeon- Dione Plover. Zackary Mckeone, MD  Assistant- Ardeen Jourdain, PA-C  Anesthesia-  Adductor canal block and spinal  EBL-25 mL   Drains Hemovac  Tourniquet time-  Total Tourniquet Time Documented: Thigh (Right) - 34 minutes Total: Thigh (Right) - 34 minutes     Complications- None  Condition-PACU - hemodynamically stable.   Brief Clinical Note  Jonathan Ochoa is a 67 y.o. year old male with end stage OA of his right knee with progressively worsening pain and dysfunction. He has constant pain, with activity and at rest and significant functional deficits with difficulties even with ADLs. He has had extensive non-op management including analgesics, injections of cortisone and viscosupplements, and home exercise program, but remains in significant pain with significant dysfunction. Radiographs show bone on bone arthritis bone on bone medial and patellofemoral. He presents now for right Total Knee Arthroplasty.    Procedure in detail---   The patient is brought into the operating room and positioned supine on the operating table. After successful administration of  Adductor canal block and spinal,   a tourniquet is placed high on the  Right thigh(s) and the lower extremity is prepped and draped in the usual sterile fashion. Time out is performed by the operating team and then the  Right lower extremity is wrapped in Esmarch, knee flexed and the tourniquet inflated to 300 mmHg.       A midline incision is made with a ten blade through the subcutaneous tissue to the level of the extensor mechanism. A fresh blade is used to make a medial parapatellar arthrotomy. Soft tissue over the proximal medial tibia is subperiosteally elevated to the joint line with a knife and into the semimembranosus  bursa with a Cobb elevator. Soft tissue over the proximal lateral tibia is elevated with attention being paid to avoiding the patellar tendon on the tibial tubercle. The patella is everted, knee flexed 90 degrees and the ACL and PCL are removed. Findings are bone on bone medial and patellofemoral with large global osteophytes.        The drill is used to create a starting hole in the distal femur and the canal is thoroughly irrigated with sterile saline to remove the fatty contents. The 5 degree Right  valgus alignment guide is placed into the femoral canal and the distal femoral cutting block is pinned to remove 10 mm off the distal femur. Resection is made with an oscillating saw.      The tibia is subluxed forward and the menisci are removed. The extramedullary alignment guide is placed referencing proximally at the medial aspect of the tibial tubercle and distally along the second metatarsal axis and tibial crest. The block is pinned to remove 50mm off the more deficient medial  side. Resection is made with an oscillating saw. Size 8is the most appropriate size for the tibia and the proximal tibia is prepared with the modular drill and keel punch for that size.      The femoral sizing guide is placed and size 7 is most appropriate. Rotation is marked off the epicondylar axis and confirmed by creating a rectangular flexion gap at 90 degrees. The size 7 cutting block is pinned in this rotation and the anterior, posterior and chamfer cuts are made with the oscillating saw. The intercondylar block is then placed and that cut is  made.      Trial size 8 tibial component, trial size 7 posterior stabilized femur and a 8  mm posterior stabilized rotating platform insert trial is placed. Full extension is achieved with excellent varus/valgus and anterior/posterior balance throughout full range of motion. The patella is everted and thickness measured to be 25  mm. Free hand resection is taken to 14 mm, a 41 template is  placed, lug holes are drilled, trial patella is placed, and it tracks normally. Osteophytes are removed off the posterior femur with the trial in place. All trials are removed and the cut bone surfaces prepared with pulsatile lavage. Cement is mixed and once ready for implantation, the size 8 tibial implant, size  7 posterior stabilized femoral component, and the size 41 patella are cemented in place and the patella is held with the clamp. The trial insert is placed and the knee held in full extension. The Exparel (20 ml mixed with 60 ml saline) is injected into the extensor mechanism, posterior capsule, medial and lateral gutters and subcutaneous tissues.  All extruded cement is removed and once the cement is hard the permanent 8 mm posterior stabilized rotating platform insert is placed into the tibial tray.      The wound is copiously irrigated with saline solution and the extensor mechanism closed over a hemovac drain with #1 V-loc suture. The tourniquet is released for a total tourniquet time of 34  minutes. Flexion against gravity is 140 degrees and the patella tracks normally. Subcutaneous tissue is closed with 2.0 vicryl and subcuticular with running 4.0 Monocryl. The incision is cleaned and dried and steri-strips and a bulky sterile dressing are applied. The limb is placed into a knee immobilizer and the patient is awakened and transported to recovery in stable condition.      Please note that a surgical assistant was a medical necessity for this procedure in order to perform it in a safe and expeditious manner. Surgical assistant was necessary to retract the ligaments and vital neurovascular structures to prevent injury to them and also necessary for proper positioning of the limb to allow for anatomic placement of the prosthesis.   Dione Plover Robley Matassa, MD    04/05/2018, 9:26 AM

## 2018-04-05 NOTE — Anesthesia Postprocedure Evaluation (Signed)
Anesthesia Post Note  Patient: Jonathan Ochoa  Procedure(s) Performed: RIGHT TOTAL KNEE ARTHROPLASTY (Right Knee)     Patient location during evaluation: PACU Anesthesia Type: Spinal Level of consciousness: awake and alert Pain management: pain level controlled Vital Signs Assessment: post-procedure vital signs reviewed and stable Respiratory status: spontaneous breathing and respiratory function stable Cardiovascular status: blood pressure returned to baseline and stable Postop Assessment: spinal receding Anesthetic complications: no    Last Vitals:  Vitals:   04/05/18 1335 04/05/18 1416  BP:  113/61  Pulse:  (!) 51  Resp:  16  Temp: 36.4 C (!) 36.4 C  SpO2:  100%    Last Pain:  Vitals:   04/05/18 1416  TempSrc: Oral  PainSc:                  Tiajuana Amass

## 2018-04-05 NOTE — Progress Notes (Signed)
AssistedDr. Rob Fitzgerald with right, ultrasound guided, adductor canal block. Side rails up, monitors on throughout procedure. See vital signs in flow sheet. Tolerated Procedure well.  

## 2018-04-05 NOTE — Transfer of Care (Signed)
Immediate Anesthesia Transfer of Care Note  Patient: Jonathan Ochoa  Procedure(s) Performed: RIGHT TOTAL KNEE ARTHROPLASTY (Right Knee)  Patient Location: PACU  Anesthesia Type:MAC and Spinal  Level of Consciousness: awake, alert  and oriented  Airway & Oxygen Therapy: Patient Spontanous Breathing and Patient connected to face mask oxygen  Post-op Assessment: Report given to RN and Post -op Vital signs reviewed and stable  Post vital signs: Reviewed and stable  Last Vitals:  Vitals Value Taken Time  BP 97/62 04/05/2018  9:42 AM  Temp    Pulse 51 04/05/2018  9:44 AM  Resp 12 04/05/2018  9:44 AM  SpO2 100 % 04/05/2018  9:44 AM  Vitals shown include unvalidated device data.  Last Pain:  Vitals:   04/05/18 0753  TempSrc:   PainSc: 0-No pain         Complications: No apparent anesthesia complications

## 2018-04-05 NOTE — Anesthesia Procedure Notes (Signed)
Anesthesia Regional Block: Adductor canal block   Pre-Anesthetic Checklist: ,, timeout performed, Correct Patient, Correct Site, Correct Laterality, Correct Procedure, Correct Position, site marked, Risks and benefits discussed,  Surgical consent,  Pre-op evaluation,  At surgeon's request and post-op pain management  Laterality: Right  Prep: chloraprep       Needles:  Injection technique: Single-shot  Needle Type: Echogenic Needle     Needle Length: 9cm  Needle Gauge: 21     Additional Needles:   Procedures:,,,, ultrasound used (permanent image in chart),,,,  Narrative:  Start time: 04/05/2018 7:32 AM End time: 04/05/2018 7:38 AM Injection made incrementally with aspirations every 5 mL.  Performed by: Personally  Anesthesiologist: Suzette Battiest, MD

## 2018-04-06 ENCOUNTER — Encounter (HOSPITAL_COMMUNITY): Payer: Self-pay | Admitting: Orthopedic Surgery

## 2018-04-06 LAB — BASIC METABOLIC PANEL
ANION GAP: 3 — AB (ref 5–15)
BUN: 12 mg/dL (ref 8–23)
CALCIUM: 8.5 mg/dL — AB (ref 8.9–10.3)
CO2: 27 mmol/L (ref 22–32)
CREATININE: 0.82 mg/dL (ref 0.61–1.24)
Chloride: 107 mmol/L (ref 98–111)
GFR calc non Af Amer: >60 mL/min (ref >60)
Glucose, Bld: 135 mg/dL — ABNORMAL HIGH (ref 70–99)
Potassium: 4.6 mmol/L (ref 3.5–5.1)
SODIUM: 137 mmol/L (ref 135–145)

## 2018-04-06 LAB — CBC
HEMATOCRIT: 33.8 % — AB (ref 39.0–52.0)
Hemoglobin: 11.1 g/dL — ABNORMAL LOW (ref 13.0–17.0)
MCH: 30.2 pg (ref 26.0–34.0)
MCHC: 32.8 g/dL (ref 30.0–36.0)
MCV: 91.8 fL (ref 78.0–100.0)
Platelets: 218 10*3/uL (ref 150–400)
RBC: 3.68 MIL/uL — ABNORMAL LOW (ref 4.22–5.81)
RDW: 13.1 % (ref 11.5–15.5)
WBC: 9.3 10*3/uL (ref 4.0–10.5)

## 2018-04-06 MED ORDER — ASPIRIN 325 MG PO TBEC: 325.0000 mg | DELAYED_RELEASE_TABLET | Freq: Two times a day (BID) | ORAL | 0 refills | Status: AC

## 2018-04-06 MED ORDER — OXYCODONE HCL 5 MG PO TABS: 5.0000 mg | ORAL_TABLET | ORAL | 0 refills | Status: DC | PRN

## 2018-04-06 MED ORDER — METHOCARBAMOL 500 MG PO TABS: 500.0000 mg | ORAL_TABLET | Freq: Four times a day (QID) | ORAL | 0 refills | Status: DC | PRN

## 2018-04-06 MED ORDER — TRAMADOL HCL 50 MG PO TABS: 50.0000 mg | ORAL_TABLET | Freq: Four times a day (QID) | ORAL | 0 refills | Status: DC | PRN

## 2018-04-06 NOTE — Progress Notes (Signed)
Physical Therapy Treatment Patient Details Name: Jonathan Ochoa MRN: [id number] DOB: [birth year] Today's Date: 04/06/2018    History of Present Illness RTKA, LTKA 5/19    PT Comments    POD # 1 pm session Spouse present during session for instructions on safe handling all mobility mentioned below.  Advised to wear KI for stairs for increased support.  Instructed on HEP and use of ICE. Pt ready to D/C to home.   Follow Up Recommendations  Outpatient PT;Follow surgeon's recommendation for DC plan and follow-up therapies     Equipment Recommendations  None recommended by PT    Recommendations for Other Services       Precautions / Restrictions Precautions Precautions: Knee;Fall Precaution Comments: instructed to wear KI for stairs for increased support Required Braces or Orthoses: Knee Immobilizer - Right Knee Immobilizer - Right: Discontinue once straight leg raise with < 10 degree lag Restrictions Weight Bearing Restrictions: No    Mobility  Bed Mobility Overal bed mobility: Needs Assistance Bed Mobility: Supine to Sit     Supine to sit: Min guard     General bed mobility comments: Spouse assisted   Transfers Overall transfer level: Needs assistance Equipment used: Rolling walker (2 wheeled) Transfers: Sit to/from Stand Sit to Stand: Supervision;Min guard         General transfer comment: spouse assisted   Ambulation/Gait Ambulation/Gait assistance: Min Gaffer (Feet): 175 Feet Assistive device: Rolling walker (2 wheeled) Gait Pattern/deviations: Step-through pattern Gait velocity: decreased   General Gait Details: increased alternating gait   Stairs Stairs: Yes   Stair Management: One rail Left;Forwards;With crutches Number of Stairs: 1 General stair comments: with spouse present for "hands on" safe handling and 25% VC's on proper tech   Wheelchair Mobility    Modified [name] (Stroke Patients Only)        Balance                                            Cognition Arousal/Alertness: Awake/alert Behavior During Therapy: WFL for tasks assessed/performed Overall Cognitive Status: Within Functional Limits for tasks assessed                                        Exercises      General Comments        Pertinent Vitals/Pain Pain Assessment: No/denies pain Pain Score: 3  Pain Location: right knee Pain Descriptors / Indicators: Discomfort;Operative site guarding Pain Intervention(s): Monitored during session;Limited activity within patient's tolerance;Repositioned;Ice applied    Home Living                      Prior Function            PT Goals (current goals can now be found in the care plan section) Progress towards PT goals: Progressing toward goals    Frequency    7X/week      PT Plan Current plan remains appropriate    Co-evaluation              AM-PAC PT "6 Clicks" Daily Activity  Outcome Measure  Difficulty turning over in bed (including adjusting bedclothes, sheets and blankets)?: A Little Difficulty moving from lying on back to sitting on the side of the bed? : A  Little Difficulty sitting down on and standing up from a chair with arms (e.g., wheelchair, bedside commode, etc,.)?: A Little Help needed moving to and from a bed to chair (including a wheelchair)?: A Little Help needed walking in hospital room?: A Little Help needed climbing 3-5 steps with a railing? : A Lot 6 Click Score: 17    End of Session Equipment Utilized During Treatment: Gait belt Activity Tolerance: Patient tolerated treatment well Patient left: in bed;with call bell/phone within reach Nurse Communication: Mobility status PT Visit Diagnosis: Unsteadiness on feet (R26.81)     Time: 6812-7517 PT Time Calculation (min) (ACUTE ONLY): 25 min  Charges:  $Gait Training: 8-22 mins $Therapeutic Activity: 8-22 mins                      Rica Koyanagi  PTA Acute  Rehabilitation Services Pager      [phone] Office      [phone]

## 2018-04-06 NOTE — Progress Notes (Signed)
   Subjective: 1 Day Post-Op Procedure(s) (LRB): RIGHT TOTAL KNEE ARTHROPLASTY (Right) Patient reports pain as mild.   Patient seen in rounds by Dr. Wynelle Link. Patient is well, and has had no acute complaints or problems. States he is ready to go home. Foley catheter removed this AM. Denies chest pain, SOB, or calf pain. No issues overnight. Did well ambulating with therapy yesterday, will continue working with PT today.  Objective: Vital signs in last 24 hours: Temp:  [97.3 F (36.3 C)-98.3 F (36.8 C)] 98.2 F (36.8 C) (10/01 0624) Pulse Rate:  [44-66] 66 (10/01 0624) Resp:  [9-18] 18 (10/01 0624) BP: (97-139)/(59-75) 139/67 (10/01 0624) SpO2:  [93 %-100 %] 100 % (10/01 0624)  Intake/Output from previous day:  Intake/Output Summary (Last 24 hours) at 04/06/2018 0724 Last data filed at 04/06/2018 0610 Gross per 24 hour  Intake 2786.47 ml  Output 3155 ml  Net -368.53 ml    Labs: Recent Labs    04/06/18 0532  HGB 11.1*   Recent Labs    04/06/18 0532  WBC 9.3  RBC 3.68*  HCT 33.8*  PLT 218   Recent Labs    04/06/18 0532  NA 137  K 4.6  CL 107  CO2 27  BUN 12  CREATININE 0.82  GLUCOSE 135*  CALCIUM 8.5*   Exam: General - Patient is Alert and Oriented Extremity - Neurologically intact Neurovascular intact Sensation intact distally Dorsiflexion/Plantar flexion intact Dressing - dressing C/D/I Motor Function - intact, moving foot and toes well on exam.   Past Medical History:  Diagnosis Date  . Arthritis   . Hyperthyroidism   . Hypothyroidism   . Thyroid disease     Assessment/Plan: 1 Day Post-Op Procedure(s) (LRB): RIGHT TOTAL KNEE ARTHROPLASTY (Right) Active Problems:   OA (osteoarthritis) of knee  Estimated body mass index is 30.01 kg/m as calculated from the following:   Height as of this encounter: 5\' 11"  (1.803 m).   Weight as of this encounter: 97.6 kg. Advance diet Up with therapy D/C IV fluids  Anticipated LOS equal to or greater  than 2 midnights due to - Age 20 and older with one or more of the following:  - Obesity  - Expected need for hospital services (PT, OT, Nursing) required for safe  discharge  - Anticipated need for postoperative skilled nursing care or inpatient rehab  - Active co-morbidities: None OR   - Unanticipated findings during/Post Surgery: None  - Patient is a high risk of re-admission due to: None    DVT Prophylaxis - Aspirin Weight bearing as tolerated. D/C O2 and pulse ox and try on room air. Hemovac pulled without difficulty, will continue therapy today.  Plan is to go Home after hospital stay. Plan for discharge today with outpatient physical therapy at [facility]. Follow-up in the office in 2 weeks with Dr. Wynelle Link.  Theresa Duty, PA-C Orthopedic Surgery 04/06/2018, 7:24 AM

## 2018-04-06 NOTE — Progress Notes (Signed)
Patient discharged to home w/ family. Given all belongings, instructions, prescriptions, equipment. Patient and wife verbalized understanding of instructions. Escorted to pov via w/c.

## 2018-04-06 NOTE — Progress Notes (Signed)
Physical Therapy Treatment Patient Details Name: Jonathan Ochoa MRN: [id number] DOB: 07/14/1950 Today's Date: 04/06/2018    History of Present Illness RTKA, LTKA 5/19    PT Comments    POD # 1 am session Assisted OOB to amb in hallway then returned to room to perform TE's  Followed by ICE. Pt very knowledgeable having had other knee surgery.  Follow Up Recommendations  Outpatient PT;Follow surgeon's recommendation for DC plan and follow-up therapies     Equipment Recommendations  None recommended by PT    Recommendations for Other Services       Precautions / Restrictions Precautions Precautions: Knee;Fall Precaution Comments: instructed to wear KI for stairs for increased support Required Braces or Orthoses: Knee Immobilizer - Right Knee Immobilizer - Right: Discontinue once straight leg raise with < 10 degree lag Restrictions Weight Bearing Restrictions: No    Mobility  Bed Mobility Overal bed mobility: Needs Assistance Bed Mobility: Supine to Sit     Supine to sit: Min guard     General bed mobility comments: self assisting the right leg. plus increased time  Transfers Overall transfer level: Needs assistance Equipment used: Rolling walker (2 wheeled) Transfers: Sit to/from Stand Sit to Stand: Supervision;Min guard         General transfer comment: 25% VC's on safety with turns and hand placement with stand to sit  Ambulation/Gait Ambulation/Gait assistance: Min guard;Supervision Gait Distance (Feet): 175 Feet Assistive device: Rolling walker (2 wheeled) Gait Pattern/deviations: Step-through pattern Gait velocity: decreased   General Gait Details: increased alternating gait   Stairs             Wheelchair Mobility    Modified [name] (Stroke Patients Only)       Balance                                            Cognition Arousal/Alertness: Awake/alert Behavior During Therapy: WFL for tasks  assessed/performed Overall Cognitive Status: Within Functional Limits for tasks assessed                                        Exercises   Total Knee Replacement TE's 10 reps B LE ankle pumps 10 reps towel squeezes 10 reps knee presses 10 reps heel slides  10 reps SAQ's 10 reps SLR's 10 reps ABD Followed by ICE     General Comments        Pertinent Vitals/Pain Pain Assessment: No/denies pain Pain Score: 3  Pain Location: right knee Pain Descriptors / Indicators: Discomfort;Operative site guarding Pain Intervention(s): Monitored during session;Limited activity within patient's tolerance;Repositioned;Ice applied    Home Living                      Prior Function            PT Goals (current goals can now be found in the care plan section) Progress towards PT goals: Progressing toward goals    Frequency    7X/week      PT Plan Current plan remains appropriate    Co-evaluation              AM-PAC PT "6 Clicks" Daily Activity  Outcome Measure  Difficulty turning over in bed (including adjusting bedclothes, sheets and blankets)?: A  Little Difficulty moving from lying on back to sitting on the side of the bed? : A Little Difficulty sitting down on and standing up from a chair with arms (e.g., wheelchair, bedside commode, etc,.)?: A Little Help needed moving to and from a bed to chair (including a wheelchair)?: A Little Help needed walking in hospital room?: A Little Help needed climbing 3-5 steps with a railing? : A Lot 6 Click Score: 17    End of Session Equipment Utilized During Treatment: Gait belt Activity Tolerance: Patient tolerated treatment well Patient left: in bed;with call bell/phone within reach Nurse Communication: Mobility status PT Visit Diagnosis: Unsteadiness on feet (R26.81)     Time: 9450-3888 PT Time Calculation (min) (ACUTE ONLY): 27 min  Charges:  $Gait Training: 8-22 mins $Therapeutic Exercise:  8-22 mins                     Rica Koyanagi  PTA Acute  Rehabilitation Services Pager      [phone] Office      [phone]

## 2018-04-07 ENCOUNTER — Encounter (HOSPITAL_COMMUNITY): Payer: Medicare HMO

## 2018-04-09 ENCOUNTER — Ambulatory Visit (HOSPITAL_COMMUNITY): Payer: Medicare HMO | Attending: Orthopedic Surgery

## 2018-04-09 ENCOUNTER — Other Ambulatory Visit: Payer: Self-pay

## 2018-04-09 ENCOUNTER — Encounter (HOSPITAL_COMMUNITY): Payer: Self-pay

## 2018-04-09 DIAGNOSIS — R262 Difficulty in walking, not elsewhere classified: Secondary | ICD-10-CM | POA: Diagnosis present

## 2018-04-09 DIAGNOSIS — M6281 Muscle weakness (generalized): Secondary | ICD-10-CM | POA: Insufficient documentation

## 2018-04-09 DIAGNOSIS — R600 Localized edema: Secondary | ICD-10-CM | POA: Insufficient documentation

## 2018-04-09 DIAGNOSIS — M25561 Pain in right knee: Secondary | ICD-10-CM | POA: Insufficient documentation

## 2018-04-09 NOTE — Therapy (Signed)
[place], [place], [address] Phone: [phone]   Fax:  [phone]  Physical Therapy Evaluation  Patient Details  Name: Jonathan Ochoa MRN: [id number] Date of Birth: September 16, 1950 Referring Provider (PT): [name], MD   Encounter Date: 04/09/2018  PT End of Session - 04/09/18 1512    Visit Number  1    Number of Visits  19    Date for PT Re-Evaluation  05/21/18   mini reassess on 04/30/18   Authorization Type  [employer] HMO    Authorization Time Period  04/09/18 to 05/21/18    Authorization - Visit Number  1    Authorization - Number of Visits  10    PT Start Time  5093    PT Stop Time  1421    PT Time Calculation (min)  34 min    Activity Tolerance  Patient tolerated treatment well;No increased pain    Behavior During Therapy  WFL for tasks assessed/performed       Past Medical History:  Diagnosis Date  . Arthritis   . Hyperthyroidism   . Hypothyroidism   . Thyroid disease     Past Surgical History:  Procedure Laterality Date  . adhesions of abdomen     from scar tissue from appendectomy  . AMPUTATION OF REPLICATED TOES     partial tip amputation of left big toe, and next toe   . APPENDECTOMY  1977  . arthroscpy      left knee -meniscus tear  . COLONOSCOPY    . STERIOD INJECTION Right 11/16/2017   Procedure: CORTISONE  INJECTION RIGHT KNEE;  Surgeon: [name], MD;  Location: [facility];  Service: Orthopedics;  Laterality: Right;  . TOE SURGERY    . TOTAL KNEE ARTHROPLASTY Left 11/16/2017   Procedure: LEFT TOTAL KNEE ARTHROPLASTY;  Surgeon: [name], MD;  Location: [facility];  Service: Orthopedics;  Laterality: Left;  . TOTAL KNEE ARTHROPLASTY Right 04/05/2018   Procedure: RIGHT TOTAL KNEE ARTHROPLASTY;  Surgeon: [name], MD;  Location: [facility];  Service: Orthopedics;  Laterality: Right;    There were no vitals filed for this visit.   Subjective Assessment - 04/09/18 1350     Subjective  Pt underwent R TKA by Dr. [name] on 04/05/18 and was d/c home the next day. He did not receive HHPT. Pain and bone on bone is what led to his surgery. He is currently using a RW for gait but prior to his surgery he did not require an AD. He is currently having the most difficulty with moving it, bending, and with his pain. He states that his pain is primarily at his anterior knee, but sometimes it shoots down his leg and his thigh gets sore.     Limitations  Standing;Walking;House hold activities    How long can you sit comfortably?  1 hour    How long can you stand comfortably?  maybe 10-15 mins    How long can you walk comfortably?  not too far    Patient Stated Goals  get in as good of shape as his L knee    Currently in Pain?  Yes    Pain Score  3     Pain Location  Knee    Pain Orientation  Right    Pain Descriptors / Indicators  Aching;Dull    Pain Type  Surgical pain    Pain Onset  In the past 7 days  Pain Frequency  Intermittent    Aggravating Factors   bending it, moving it    Pain Relieving Factors  rest and ice    Effect of Pain on Daily Activities  increases         [facility] PT Assessment - 04/09/18 0001      Assessment   Medical Diagnosis  R TKA    Referring Provider (PT)  [name], MD    Onset Date/Surgical Date  04/05/18    Next MD Visit  04/20/18    Prior Therapy  none for R TKA, OPPT for L TAK earlier this year      Prior Function   Level of Independence  Independent      Cognition   Overall Cognitive Status  Within Functional Limits for tasks assessed      Observation/Other Assessments-Edema    Edema  Circumferential      Circumferential Edema   Circumferential - Right  47.5cm, joint line    Circumferential - Left   44cm joint line      ROM / Strength   AROM / PROM / Strength  AROM;Strength      AROM   Right/Left Knee  Right    Right Knee Extension  21    Right Knee Flexion  88      Strength   Strength Assessment Site   Hip;Knee;Ankle    Right Hip Flexion  3-/5    Right Hip Extension  4-/5    Right Hip ABduction  4-/5    Left Hip Flexion  5/5    Left Hip Extension  4+/5    Left Hip ABduction  4/5    Right Knee Flexion  4-/5    Right Knee Extension  2+/5    Left Knee Flexion  5/5    Left Knee Extension  5/5    Right Ankle Dorsiflexion  4/5    Left Ankle Dorsiflexion  5/5      Palpation   Patella mobility  hypomobile thorughout    Palpation comment  increased restrictions R quad, tender to palpation      Ambulation/Gait   Ambulation Distance (Feet)  274 Feet   3MWT   Assistive device  Rolling walker    Gait Pattern  Step-through pattern;Decreased stance time - right;Decreased stride length;Decreased hip/knee flexion - right;Antalgic;Trendelenburg;Trunk flexed      Balance   Balance Assessed  Yes      Static Standing Balance   Static Standing - Balance Support  No upper extremity supported    Static Standing Balance -  Activities   Single Leg Stance - Right Leg;Single Leg Stance - Left Leg    Static Standing - Comment/# of Minutes  R: 0sec L: 60sec      Standardized Balance Assessment   Standardized Balance Assessment  Five Times Sit to Stand    Five times sit to stand comments   22.2sec, RLE extended, BUE support           Objective measurements completed on examination: See above findings.        PT Education - 04/09/18 1512    Education provided  Yes    Education Details  exam findings, POC, HEP    Person(s) Educated  Patient    Methods  Explanation;Demonstration;Handout    Comprehension  Verbalized understanding       PT Short Term Goals - 04/09/18 1607      PT SHORT TERM GOAL #1   Title  Pt  will have improved R knee AROM from 10-100deg in order to decrease pain and improve gait.    Time  3    Period  Weeks    Status  New    Target Date  04/30/18      PT SHORT TERM GOAL #2   Title  Pt will have decreased joint line edema by 3cm or > in order to decrease pain and  maximize ROM.    Time  3    Period  Weeks    Status  New      PT SHORT TERM GOAL #3   Title  Pt will have improved MMT to at least 4/5 MMT throughout all mm groups tested in order to maximize transfers, balance, and gait.    Time  3    Period  Weeks    Status  New      PT SHORT TERM GOAL #4   Title  Pt will be able to perform R SLS for 10 sec without UE support to demo improved functional strength and maximize gait.    Time  3    Period  Weeks    Status  New        PT Long Term Goals - 04/09/18 1608      PT LONG TERM GOAL #1   Title  Pt will have improved R knee AROM from 5-120deg or better in order to further decrease pain and maximize stair ambulation.    Time  6    Period  Weeks    Status  New    Target Date  05/21/18      PT LONG TERM GOAL #2   Title  Pt will have improved MMT to at least 4+/5 or better throughout all mm groups tested in order to further maximize functional mobility and return to PLOF.    Time  6    Period  Weeks    Status  New      PT LONG TERM GOAL #3   Title  Pt will be able to perform R SLS for 30 sec or > without UE support to further maximize gait and stair ambulation.     Time  6    Period  Weeks    Status  New      PT LONG TERM GOAL #4   Title  Pt will be able to ambulate 617ft during 3MWT with LRAD and gait WFL in order to demo return to PLOF and allow pt to return to his regular workout routine.    Time  6    Period  Weeks    Status  New      PT LONG TERM GOAL #5   Title  Pt will be able to perform 5xSTS in 12 sec or < with proper form to demo improved functional strength.    Time  6    Period  Weeks    Status  New             Plan - 04/09/18 1513    Clinical Impression Statement  Pt is pleasant 67YO M who presents to OPPT s/p R TKA on 04/05/18 by Dr. Wynelle Link. Pt presents with post-op deficits in edema, ROM, MMT, functional strength, balance, gait, and functional mobility as well as increased pain. Pt's AROM 21-88deg this  date. Pt needs skilled PT intervention to address impairments in order to decrease pain, improve ROM, and maximize return to PLOF.    Clinical Presentation  Stable  Clinical Presentation due to:  see objective tests and measures    Clinical Decision Making  Low    Rehab Potential  Good    PT Frequency  3x / week    PT Duration  6 weeks    PT Treatment/Interventions  ADLs/Self Care Home Management;Biofeedback;Cryotherapy;Electrical Stimulation;Moist Heat;Ultrasound;DME Instruction;Gait training;Stair training;Functional mobility training;Therapeutic activities;Therapeutic exercise;Balance training;Neuromuscular re-education;Patient/family education;Manual techniques;Scar mobilization;Passive range of motion;Dry needling;Energy conservation;Taping;Compression bandaging    PT Next Visit Plan  review goals and HEP; focus initially on ROM and edema prior to increasing strengthening    PT Home Exercise Plan  eval: heel slides, quad set    Consulted and Agree with Plan of Care  Patient       Patient will benefit from skilled therapeutic intervention in order to improve the following deficits and impairments:  Abnormal gait, Decreased activity tolerance, Decreased balance, Decreased endurance, Decreased mobility, Decreased range of motion, Decreased scar mobility, Decreased strength, Difficulty walking, Hypomobility, Increased edema, Increased fascial restricitons, Increased muscle spasms, Impaired flexibility, Improper body mechanics, Pain  Visit Diagnosis: Acute pain of right knee - Plan: PT plan of care cert/re-cert  Localized edema - Plan: PT plan of care cert/re-cert  Muscle weakness (generalized) - Plan: PT plan of care cert/re-cert  Difficulty in walking, not elsewhere classified - Plan: PT plan of care cert/re-cert     Problem List Patient Active Problem List   Diagnosis Date Noted  . OA (osteoarthritis) of knee 11/16/2017        Geraldine Solar PT, DPT  [place] [address] [place], [place], [address] Phone: [phone]   Fax:  [phone]  Name: Jonathan Ochoa MRN: [id number] Date of Birth: 1950/10/01

## 2018-04-12 ENCOUNTER — Ambulatory Visit (HOSPITAL_COMMUNITY): Payer: Medicare HMO

## 2018-04-12 ENCOUNTER — Encounter (HOSPITAL_COMMUNITY): Payer: Self-pay

## 2018-04-12 DIAGNOSIS — R600 Localized edema: Secondary | ICD-10-CM | POA: Diagnosis not present

## 2018-04-12 DIAGNOSIS — M25561 Pain in right knee: Secondary | ICD-10-CM | POA: Diagnosis not present

## 2018-04-12 DIAGNOSIS — M6281 Muscle weakness (generalized): Secondary | ICD-10-CM

## 2018-04-12 DIAGNOSIS — R262 Difficulty in walking, not elsewhere classified: Secondary | ICD-10-CM

## 2018-04-12 NOTE — Therapy (Signed)
[place], [place], [address] Phone: [phone]   Fax:  [phone]  Physical Therapy Treatment  Patient Details  Name: Jonathan Ochoa MRN: [id number] Date of Birth: March 13, 1951 Referring Provider (PT): [name], MD   Encounter Date: 04/12/2018  PT End of Session - 04/12/18 1431    Visit Number  2    Number of Visits  19    Date for PT Re-Evaluation  05/21/18   mini reassess on 04/30/18   Authorization Type  [employer] HMO    Authorization Time Period  04/09/18 to 05/21/18    Authorization - Visit Number  2    Authorization - Number of Visits  10    PT Start Time  6962    PT Stop Time  1510    PT Time Calculation (min)  41 min    Activity Tolerance  Patient tolerated treatment well;No increased pain    Behavior During Therapy  WFL for tasks assessed/performed       Past Medical History:  Diagnosis Date  . Arthritis   . Hyperthyroidism   . Hypothyroidism   . Thyroid disease     Past Surgical History:  Procedure Laterality Date  . adhesions of abdomen     from scar tissue from appendectomy  . AMPUTATION OF REPLICATED TOES     partial tip amputation of left big toe, and next toe   . APPENDECTOMY  1977  . arthroscpy      left knee -meniscus tear  . COLONOSCOPY    . STERIOD INJECTION Right 11/16/2017   Procedure: CORTISONE  INJECTION RIGHT KNEE;  Surgeon: [name], MD;  Location: [facility];  Service: Orthopedics;  Laterality: Right;  . TOE SURGERY    . TOTAL KNEE ARTHROPLASTY Left 11/16/2017   Procedure: LEFT TOTAL KNEE ARTHROPLASTY;  Surgeon: [name], MD;  Location: [facility];  Service: Orthopedics;  Laterality: Left;  . TOTAL KNEE ARTHROPLASTY Right 04/05/2018   Procedure: RIGHT TOTAL KNEE ARTHROPLASTY;  Surgeon: [name], MD;  Location: [facility];  Service: Orthopedics;  Laterality: Right;    There were no vitals filed for this visit.  Subjective Assessment - 04/12/18 1435     Subjective  Pt reports he is doing well. Pain is not too bad today, about a 1/10. Exercises went well at home.     Limitations  Standing;Walking;House hold activities    How long can you sit comfortably?  1 hour    How long can you stand comfortably?  maybe 10-15 mins    How long can you walk comfortably?  not too far    Patient Stated Goals  get in as good of shape as his L knee    Currently in Pain?  Yes    Pain Score  1     Pain Location  Knee    Pain Orientation  Right    Pain Descriptors / Indicators  Aching;Dull    Pain Type  Surgical pain    Pain Onset  In the past 7 days    Pain Frequency  Intermittent    Aggravating Factors   bending it, moving it    Pain Relieving Factors  rest and ice    Effect of Pain on Daily Activities  increases             [facility] Adult PT Treatment/Exercise - 04/12/18 0001      Exercises   Exercises  Knee/Hip  Knee/Hip Exercises: Stretches   Active Hamstring Stretch  Right;3 reps;30 seconds    Active Hamstring Stretch Limitations  supine wiht rope      Knee/Hip Exercises: Supine   Quad Sets  Right;10 reps    Quad Sets Limitations  5" holds, cues to decrease glute compensation    Short Arc [employer]  Right;15 reps    Short Arc Quad Sets Limitations  3-5" holds    Heel Slides  Right;15 reps    Heel Slides Limitations  3-5" holds    Straight Leg Raises  Right;5 reps    Straight Leg Raises Limitations  quad set prior; very difficult for patient    Knee Extension Limitations  18    Knee Flexion Limitations  90      Manual Therapy   Manual Therapy  Edema management    Manual therapy comments  completed seperately from all other skilled interventions    Edema Management  retro massage with BLE elevated + ankle pumps             PT Education - 04/12/18 1434    Education provided  Yes    Education Details  reviewed goals, exercise technique    Person(s) Educated  Patient    Methods  Explanation;Demonstration     Comprehension  Verbalized understanding;Returned demonstration       PT Short Term Goals - 04/09/18 1607      PT SHORT TERM GOAL #1   Title  Pt will have improved R knee AROM from 10-100deg in order to decrease pain and improve gait.    Time  3    Period  Weeks    Status  New    Target Date  04/30/18      PT SHORT TERM GOAL #2   Title  Pt will have decreased joint line edema by 3cm or > in order to decrease pain and maximize ROM.    Time  3    Period  Weeks    Status  New      PT SHORT TERM GOAL #3   Title  Pt will have improved MMT to at least 4/5 MMT throughout all mm groups tested in order to maximize transfers, balance, and gait.    Time  3    Period  Weeks    Status  New      PT SHORT TERM GOAL #4   Title  Pt will be able to perform R SLS for 10 sec without UE support to demo improved functional strength and maximize gait.    Time  3    Period  Weeks    Status  New        PT Long Term Goals - 04/09/18 1608      PT LONG TERM GOAL #1   Title  Pt will have improved R knee AROM from 5-120deg or better in order to further decrease pain and maximize stair ambulation.    Time  6    Period  Weeks    Status  New    Target Date  05/21/18      PT LONG TERM GOAL #2   Title  Pt will have improved MMT to at least 4+/5 or better throughout all mm groups tested in order to further maximize functional mobility and return to PLOF.    Time  6    Period  Weeks    Status  New      PT LONG TERM GOAL #3  Title  Pt will be able to perform R SLS for 30 sec or > without UE support to further maximize gait and stair ambulation.     Time  6    Period  Weeks    Status  New      PT LONG TERM GOAL #4   Title  Pt will be able to ambulate 619ft during 3MWT with LRAD and gait WFL in order to demo return to PLOF and allow pt to return to his regular workout routine.    Time  6    Period  Weeks    Status  New      PT LONG TERM GOAL #5   Title  Pt will be able to perform 5xSTS in 12  sec or < with proper form to demo improved functional strength.    Time  6    Period  Weeks    Status  New            Plan - 04/12/18 1511    Clinical Impression Statement  Began session by reviewing goals with pt and he did not have any f/u questions. Pt 1-week post-op today. Today's session focused on mobility and decreasing edema. Pt requiring min-mod cues for proper quad contraction and to decrease gluteal compensation. AROM 18-90deg this date. Continue as planned, progressing as able.     Rehab Potential  Good    PT Frequency  3x / week    PT Duration  6 weeks    PT Treatment/Interventions  ADLs/Self Care Home Management;Biofeedback;Cryotherapy;Electrical Stimulation;Moist Heat;Ultrasound;DME Instruction;Gait training;Stair training;Functional mobility training;Therapeutic activities;Therapeutic exercise;Balance training;Neuromuscular re-education;Patient/family education;Manual techniques;Scar mobilization;Passive range of motion;Dry needling;Energy conservation;Taping;Compression bandaging    PT Next Visit Plan  continue focus initially on ROM and edema prior to increasing strengthening    PT Home Exercise Plan  eval: heel slides, quad set    Consulted and Agree with Plan of Care  Patient       Patient will benefit from skilled therapeutic intervention in order to improve the following deficits and impairments:  Abnormal gait, Decreased activity tolerance, Decreased balance, Decreased endurance, Decreased mobility, Decreased range of motion, Decreased scar mobility, Decreased strength, Difficulty walking, Hypomobility, Increased edema, Increased fascial restricitons, Increased muscle spasms, Impaired flexibility, Improper body mechanics, Pain  Visit Diagnosis: Acute pain of right knee  Localized edema  Muscle weakness (generalized)  Difficulty in walking, not elsewhere classified     Problem List Patient Active Problem List   Diagnosis Date Noted  . OA (osteoarthritis)  of knee 11/16/2017        Geraldine Solar PT, DPT  [place] [address] [place], [place], [address] Phone: [phone]   Fax:  [phone]  Name: Jonathan Ochoa MRN: [id number] Date of Birth: 11/11/1950

## 2018-04-12 NOTE — Discharge Summary (Signed)
Physician Discharge Summary   Patient ID: Jonathan Ochoa MRN: [id number] DOB/AGE: Mar 13, 1951 67 y.o.  Admit date: 04/05/2018 Discharge date: 04/06/2018  Primary Diagnosis: Osteoarthritis, right knee   Admission Diagnoses:  Past Medical History:  Diagnosis Date  . Arthritis   . Hyperthyroidism   . Hypothyroidism   . Thyroid disease    Discharge Diagnoses:   Active Problems:   OA (osteoarthritis) of knee  Estimated body mass index is 30.01 kg/m as calculated from the following:   Height as of this encounter: '5\' 11"'$  (1.803 m).   Weight as of this encounter: 97.6 kg.  Procedure:  Procedure(s) (LRB): RIGHT TOTAL KNEE ARTHROPLASTY (Right)   Consults: None  HPI: Jonathan Ochoa is a 67 y.o. year old male with end stage OA of his right knee with progressively worsening pain and dysfunction. He has constant pain, with activity and at rest and significant functional deficits with difficulties even with ADLs. He has had extensive non-op management including analgesics, injections of cortisone and viscosupplements, and home exercise program, but remains in significant pain with significant dysfunction. Radiographs show bone on bone arthritis bone on bone medial and patellofemoral. He presents now for right Total Knee Arthroplasty.    Laboratory Data: Admission on 04/05/2018, Discharged on 04/06/2018  Component Date Value Ref Range Status  . WBC 04/06/2018 9.3  4.0 - 10.5 K/uL Final  . RBC 04/06/2018 3.68* 4.22 - 5.81 MIL/uL Final  . Hemoglobin 04/06/2018 11.1* 13.0 - 17.0 g/dL Final  . HCT 04/06/2018 33.8* 39.0 - 52.0 % Final  . MCV 04/06/2018 91.8  78.0 - 100.0 fL Final  . MCH 04/06/2018 30.2  26.0 - 34.0 pg Final  . MCHC 04/06/2018 32.8  30.0 - 36.0 g/dL Final  . RDW 04/06/2018 13.1  11.5 - 15.5 % Final  . Platelets 04/06/2018 218  150 - 400 K/uL Final   Performed at [facility], [place] [address], [place], [place] [address]  . Sodium 04/06/2018 137  135 -  145 mmol/L Final  . Potassium 04/06/2018 4.6  3.5 - 5.1 mmol/L Final  . Chloride 04/06/2018 107  98 - 111 mmol/L Final  . CO2 04/06/2018 27  22 - 32 mmol/L Final  . Glucose, Bld 04/06/2018 135* 70 - 99 mg/dL Final  . BUN 04/06/2018 12  8 - 23 mg/dL Final  . Creatinine, Ser 04/06/2018 0.82  0.61 - 1.24 mg/dL Final  . Calcium 04/06/2018 8.5* 8.9 - 10.3 mg/dL Final  . GFR calc non Af Amer 04/06/2018 >60  >60 mL/min Final  . GFR calc Af Amer 04/06/2018 >60  >60 mL/min Final   Comment: (NOTE) The eGFR has been calculated using the CKD EPI equation. This calculation has not been validated in all clinical situations. eGFR's persistently <60 mL/min signify possible Chronic Kidney Disease.   Georgiann Hahn gap 04/06/2018 3* 5 - 15 Final   Performed at [facility], [place] [address] [place], [place] [address]  Hospital Outpatient Visit on 03/29/2018  Component Date Value Ref Range Status  . aPTT 03/29/2018 28  24 - 36 seconds Final   Performed at [facility], [place] [address], [place], [place] [address]  . WBC 03/29/2018 7.2  4.0 - 10.5 K/uL Final  . RBC 03/29/2018 4.44  4.22 - 5.81 MIL/uL Final  . Hemoglobin 03/29/2018 13.1  13.0 - 17.0 g/dL Final  . HCT 03/29/2018 40.5  39.0 - 52.0 % Final  . MCV 03/29/2018 91.2  78.0 -  100.0 fL Final  . MCH 03/29/2018 29.5  26.0 - 34.0 pg Final  . MCHC 03/29/2018 32.3  30.0 - 36.0 g/dL Final  . RDW 03/29/2018 13.0  11.5 - 15.5 % Final  . Platelets 03/29/2018 271  150 - 400 K/uL Final   Performed at [facility], [place] [address] [place], [place] [address]  . Sodium 03/29/2018 140  135 - 145 mmol/L Final  . Potassium 03/29/2018 4.4  3.5 - 5.1 mmol/L Final  . Chloride 03/29/2018 107  98 - 111 mmol/L Final  . CO2 03/29/2018 24  22 - 32 mmol/L Final  . Glucose, Bld 03/29/2018 85  70 - 99 mg/dL Final  . BUN 03/29/2018 21  8 - 23 mg/dL Final  . Creatinine, Ser 03/29/2018 0.77  0.61 - 1.24 mg/dL Final  . Calcium  03/29/2018 9.2  8.9 - 10.3 mg/dL Final  . Total Protein 03/29/2018 7.5  6.5 - 8.1 g/dL Final  . Albumin 03/29/2018 3.8  3.5 - 5.0 g/dL Final  . AST 03/29/2018 28  15 - 41 U/L Final  . ALT 03/29/2018 20  0 - 44 U/L Final  . Alkaline Phosphatase 03/29/2018 93  38 - 126 U/L Final  . Total Bilirubin 03/29/2018 0.7  0.3 - 1.2 mg/dL Final  . GFR calc non Af Amer 03/29/2018 >60  >60 mL/min Final  . GFR calc Af Amer 03/29/2018 >60  >60 mL/min Final   Comment: (NOTE) The eGFR has been calculated using the CKD EPI equation. This calculation has not been validated in all clinical situations. eGFR's persistently <60 mL/min signify possible Chronic Kidney Disease.   Georgiann Hahn gap 03/29/2018 9  5 - 15 Final   Performed at [facility], [place] [address], [place], [place] [address]  . Prothrombin Time 03/29/2018 12.9  11.4 - 15.2 seconds Final  . INR 03/29/2018 0.98   Final   Performed at [facility], [place] [address], [place], [place] [address]  . ABO/RH(D) 03/29/2018 A POS   Final  . Antibody Screen 03/29/2018 NEG   Final  . Sample Expiration 03/29/2018 04/08/2018   Final  . Extend sample reason 03/29/2018    Final                   Value:NO TRANSFUSIONS OR PREGNANCY IN THE PAST 3 MONTHS Performed at [facility], [place] [address], [place], [place] [address]   . MRSA, PCR 03/29/2018 NEGATIVE  NEGATIVE Final  . Staphylococcus aureus 03/29/2018 NEGATIVE  NEGATIVE Final   Comment: (NOTE) The Xpert SA Assay (FDA approved for NASAL specimens in patients 7 years of age and older), is one component of a comprehensive surveillance program. It is not intended to diagnose infection nor to guide or monitor treatment. Performed at [facility], [place] [address] [place], [place] [address]      X-Rays:No results found.  EKG:No orders found for this or any previous visit.   Hospital Course: Jonathan Ochoa is a 67 y.o. who was  admitted to [facility]. They were brought to the operating room on 04/05/2018 and underwent Procedure(s): RIGHT TOTAL KNEE ARTHROPLASTY.  Patient tolerated the procedure well and was later transferred to the recovery room and then to the orthopaedic floor for postoperative care. They were given PO and IV analgesics for pain control following their surgery. They were given 24 hours of postoperative antibiotics of  Anti-infectives (From admission, onward)   Start  Dose/Rate Route Frequency Ordered Stop   04/05/18 1415  ceFAZolin (ANCEF) IVPB 2g/100 mL premix     2 g 200 mL/hr over 30 Minutes Intravenous Every 6 hours 04/05/18 1102 04/05/18 2136   04/05/18 0615  ceFAZolin (ANCEF) IVPB 2g/100 mL premix     2 g 200 mL/hr over 30 Minutes Intravenous On call to O.R. 04/05/18 6837 04/05/18 0853     and started on DVT prophylaxis in the form of Aspirin.   PT and OT were ordered for total joint protocol. Discharge planning consulted to help with postop disposition and equipment needs.  Patient had a good night on the evening of surgery. They started to get up OOB with therapy on POD #0. Pt was seen during rounds and was ready to go home pending progress with therapy. Hemovac drain was pulled without difficulty. He worked with therapy on POD #1 and was meeting his goals. Pt was discharged to home later that day in stable condition.  Diet: Regular diet Activity: WBAT Follow-up: in 2 weeks with Dr. Wynelle Link Disposition: Home with outpatient physical therapy at [facility] Discharged Condition: stable   Discharge Instructions    Call MD / Call 911   Complete by:  As directed    If you experience chest pain or shortness of breath, CALL 911 and be transported to the hospital emergency room.  If you develope a fever above 101 F, pus (white drainage) or increased drainage or redness at the wound, or calf pain, call your surgeon's office.   Change dressing   Complete by:  As directed    Change  dressing on Wednesday (04/07/2018), then change the dressing daily with sterile 4 x 4 inch gauze dressing and apply TED hose.   Constipation Prevention   Complete by:  As directed    Drink plenty of fluids.  Prune juice may be helpful.  You may use a stool softener, such as Colace (over the counter) 100 mg twice a day.  Use MiraLax (over the counter) for constipation as needed.   Diet - low sodium heart healthy   Complete by:  As directed    Discharge instructions   Complete by:  As directed    Dr. [name] Total Joint Specialist [facility] [address] [place],  [address] [phone]  TOTAL KNEE REPLACEMENT POSTOPERATIVE DIRECTIONS  Knee Rehabilitation, Guidelines Following Surgery  Results after knee surgery are often greatly improved when you follow the exercise, range of motion and muscle strengthening exercises prescribed by your doctor. Safety measures are also important to protect the knee from further injury. Any time any of these exercises cause you to have increased pain or swelling in your knee joint, decrease the amount until you are comfortable again and slowly increase them. If you have problems or questions, call your caregiver or physical therapist for advice.   HOME CARE INSTRUCTIONS  Remove items at home which could result in a fall. This includes throw rugs or furniture in walking pathways.  ICE to the affected knee every three hours for 30 minutes at a time and then as needed for pain and swelling.  Continue to use ice on the knee for pain and swelling from surgery. You may notice swelling that will progress down to the foot and ankle.  This is normal after surgery.  Elevate the leg when you are not up walking on it.   Continue to use the breathing machine which will help keep your temperature down.  It is common for your temperature to cycle up and down following surgery, especially at night when you are not up moving around and exerting  yourself.  The breathing machine keeps your lungs expanded and your temperature down. Do not place pillow under knee, focus on keeping the knee straight while resting   DIET You may resume your previous home diet once your are discharged from the hospital.  DRESSING / WOUND CARE / SHOWERING You may shower 3 days after surgery, but keep the wounds dry during showering.  You may use an occlusive plastic wrap (Press'n Seal for example), NO SOAKING/SUBMERGING IN THE BATHTUB.  If the bandage gets wet, change with a clean dry gauze.  If the incision gets wet, pat the wound dry with a clean towel. You may start showering once you are discharged home but do not submerge the incision under water. Just pat the incision dry and apply a dry gauze dressing on daily. Change the surgical dressing daily and reapply a dry dressing each time.  ACTIVITY Walk with your walker as instructed. Use walker as long as suggested by your caregivers. Avoid periods of inactivity such as sitting longer than an hour when not asleep. This helps prevent blood clots.  You may resume a sexual relationship in one month or when given the OK by your doctor.  You may return to work once you are cleared by your doctor.  Do not drive a car for 6 weeks or until released by you surgeon.  Do not drive while taking narcotics.  WEIGHT BEARING Weight bearing as tolerated with assist device (walker, cane, etc) as directed, use it as long as suggested by your surgeon or therapist, typically at least 4-6 weeks.  POSTOPERATIVE CONSTIPATION PROTOCOL Constipation - defined medically as fewer than three stools per week and severe constipation as less than one stool per week.  One of the most common issues patients have following surgery is constipation.  Even if you have a regular bowel pattern at home, your normal regimen is likely to be disrupted due to multiple reasons following surgery.  Combination of anesthesia, postoperative narcotics,  change in appetite and fluid intake all can affect your bowels.  In order to avoid complications following surgery, here are some recommendations in order to help you during your recovery period.  Colace (docusate) - Pick up an over-the-counter form of Colace or another stool softener and take twice a day as long as you are requiring postoperative pain medications.  Take with a full glass of water daily.  If you experience loose stools or diarrhea, hold the colace until you stool forms back up.  If your symptoms do not get better within 1 week or if they get worse, check with your doctor.  Dulcolax (bisacodyl) - Pick up over-the-counter and take as directed by the product packaging as needed to assist with the movement of your bowels.  Take with a full glass of water.  Use this product as needed if not relieved by Colace only.   MiraLax (polyethylene glycol) - Pick up over-the-counter to have on hand.  MiraLax is a solution that will increase the amount of water in your bowels to assist with bowel movements.  Take as directed and can mix with a glass of water, juice, soda, coffee, or tea.  Take if you go more than two days without a movement. Do not use MiraLax more than once per day. Call your doctor if you are still constipated or irregular after  using this medication for 7 days in a row.  If you continue to have problems with postoperative constipation, please contact the office for further assistance and recommendations.  If you experience "the worst abdominal pain ever" or develop nausea or vomiting, please contact the office immediatly for further recommendations for treatment.  ITCHING  If you experience itching with your medications, try taking only a single pain pill, or even half a pain pill at a time.  You can also use Benadryl over the counter for itching or also to help with sleep.   TED HOSE STOCKINGS Wear the elastic stockings on both legs for three weeks following surgery during the  day but you may remove then at night for sleeping.  MEDICATIONS See your medication summary on the "After Visit Summary" that the nursing staff will review with you prior to discharge.  You may have some home medications which will be placed on hold until you complete the course of blood thinner medication.  It is important for you to complete the blood thinner medication as prescribed by your surgeon.  Continue your approved medications as instructed at time of discharge.  PRECAUTIONS If you experience chest pain or shortness of breath - call 911 immediately for transfer to the hospital emergency department.  If you develop a fever greater that 101 F, purulent drainage from wound, increased redness or drainage from wound, foul odor from the wound/dressing, or calf pain - CONTACT YOUR SURGEON.                                                   FOLLOW-UP APPOINTMENTS Make sure you keep all of your appointments after your operation with your surgeon and caregivers. You should call the office at the above phone number and make an appointment for approximately two weeks after the date of your surgery or on the date instructed by your surgeon outlined in the "After Visit Summary".   RANGE OF MOTION AND STRENGTHENING EXERCISES  Rehabilitation of the knee is important following a knee injury or an operation. After just a few days of immobilization, the muscles of the thigh which control the knee become weakened and shrink (atrophy). Knee exercises are designed to build up the tone and strength of the thigh muscles and to improve knee motion. Often times heat used for twenty to thirty minutes before working out will loosen up your tissues and help with improving the range of motion but do not use heat for the first two weeks following surgery. These exercises can be done on a training (exercise) mat, on the floor, on a table or on a bed. Use what ever works the best and is most comfortable for you Knee  exercises include:  Leg Lifts - While your knee is still immobilized in a splint or cast, you can do straight leg raises. Lift the leg to 60 degrees, hold for 3 sec, and slowly lower the leg. Repeat 10-20 times 2-3 times daily. Perform this exercise against resistance later as your knee gets better.  Quad and Hamstring Sets - Tighten up the muscle on the front of the thigh (Quad) and hold for 5-10 sec. Repeat this 10-20 times hourly. Hamstring sets are done by pushing the foot backward against an object and holding for 5-10 sec. Repeat as with quad sets.  Leg Slides: Lying on  your back, slowly slide your foot toward your buttocks, bending your knee up off the floor (only go as far as is comfortable). Then slowly slide your foot back down until your leg is flat on the floor again. Angel Wings: Lying on your back spread your legs to the side as far apart as you can without causing discomfort.  A rehabilitation program following serious knee injuries can speed recovery and prevent re-injury in the future due to weakened muscles. Contact your doctor or a physical therapist for more information on knee rehabilitation.   IF YOU ARE TRANSFERRED TO A SKILLED REHAB FACILITY If the patient is transferred to a skilled rehab facility following release from the hospital, a list of the current medications will be sent to the facility for the patient to continue.  When discharged from the skilled rehab facility, please have the facility set up the patient's [place] prior to being released. Also, the skilled facility will be responsible for providing the patient with their medications at time of release from the facility to include their pain medication, the muscle relaxants, and their blood thinner medication. If the patient is still at the rehab facility at time of the two week follow up appointment, the skilled rehab facility will also need to assist the patient in arranging follow up appointment in  our office and any transportation needs.  MAKE SURE YOU:  Understand these instructions.  Get help right away if you are not doing well or get worse.    Pick up stool softner and laxative for home use following surgery while on pain medications. Do not submerge incision under water. Please use good hand washing techniques while changing dressing each day. May shower starting three days after surgery. Please use a clean towel to pat the incision dry following showers. Continue to use ice for pain and swelling after surgery. Do not use any lotions or creams on the incision until instructed by your surgeon.   Do not put a pillow under the knee. Place it under the heel.   Complete by:  As directed    Driving restrictions   Complete by:  As directed    No driving for two weeks   TED hose   Complete by:  As directed    Use stockings (TED hose) for three weeks on both leg(s).  You may remove them at night for sleeping.   Weight bearing as tolerated   Complete by:  As directed      Allergies as of 04/06/2018   No Known Allergies     Medication List    TAKE these medications   acetaminophen 500 MG tablet Commonly known as:  TYLENOL Take 1,000 mg by mouth every 6 (six) hours as needed (for pain.).   aspirin 325 MG EC tablet Take 1 tablet (325 mg total) by mouth 2 (two) times daily for 20 days. Take one tablet (325 mg) Aspirin two times a day for three weeks following surgery. Then take one baby Aspirin (81 mg) once a day for three weeks. Then discontinue aspirin. What changed:    additional instructions  Another medication with the same name was removed. Continue taking this medication, and follow the directions you see here.   FIBER ADULT GUMMIES PO Take 2 tablets by mouth daily at 12 noon.   levothyroxine 137 MCG tablet Commonly known as:  SYNTHROID, LEVOTHROID Take 137 mcg by mouth daily before breakfast.   methocarbamol 500 MG tablet Commonly known as:  ROBAXIN Take 1  tablet (500 mg total) by mouth every 6 (six) hours as needed for muscle spasms.   multivitamin with minerals tablet Take 1 tablet by mouth daily at 12 noon.   oxyCODONE 5 MG immediate release tablet Commonly known as:  Oxy IR/ROXICODONE Take 1-2 tablets (5-10 mg total) by mouth every 4 (four) hours as needed for severe pain (pain score 4-6). What changed:    when to take this  reasons to take this   Saw Palmetto 450 MG Caps Take 1,350 mg by mouth daily at 12 noon.   traMADol 50 MG tablet Commonly known as:  ULTRAM Take 1-2 tablets (50-100 mg total) by mouth every 6 (six) hours as needed for moderate pain. What changed:  reasons to take this   Turmeric 500 MG Caps Take 500 mg by mouth daily at 12 noon.            Discharge Care Instructions  (From admission, onward)         Start     Ordered   04/06/18 0000  Weight bearing as tolerated     04/06/18 0729   04/06/18 0000  Change dressing    Comments:  Change dressing on Wednesday (04/07/2018), then change the dressing daily with sterile 4 x 4 inch gauze dressing and apply TED hose.   04/06/18 0729         Follow-up Information    [name], MD. Schedule an appointment as soon as possible for a visit on 04/20/2018.   Specialty:  Orthopedic Surgery Contact information: [address] [place] [address] [phone]           Signed: Theresa Duty, PA-C Orthopedic Surgery 04/12/2018, 9:02 AM

## 2018-04-14 ENCOUNTER — Ambulatory Visit (HOSPITAL_COMMUNITY): Payer: Medicare HMO

## 2018-04-14 ENCOUNTER — Encounter (HOSPITAL_COMMUNITY): Payer: Self-pay

## 2018-04-14 DIAGNOSIS — M25561 Pain in right knee: Secondary | ICD-10-CM | POA: Diagnosis not present

## 2018-04-14 DIAGNOSIS — R262 Difficulty in walking, not elsewhere classified: Secondary | ICD-10-CM

## 2018-04-14 DIAGNOSIS — R600 Localized edema: Secondary | ICD-10-CM | POA: Diagnosis not present

## 2018-04-14 DIAGNOSIS — M6281 Muscle weakness (generalized): Secondary | ICD-10-CM

## 2018-04-14 NOTE — Therapy (Signed)
[facility] [place], [place], [address] Phone: [phone]   Fax:  [phone]  Physical Therapy Treatment  Patient Details  Name: Jonathan Ochoa MRN: [id number] Date of Birth: 1951/03/09 Referring Provider (PT): [name], MD   Encounter Date: 04/14/2018  PT End of Session - 04/14/18 1424    Visit Number  3    Number of Visits  19    Date for PT Re-Evaluation  05/21/18   mini reassess on 04/30/18   Authorization Type  [employer] HMO    Authorization Time Period  04/09/18 to 05/21/18    Authorization - Visit Number  3    Authorization - Number of Visits  10    PT Start Time  6629    PT Stop Time  4765    PT Time Calculation (min)  41 min    Activity Tolerance  Patient tolerated treatment well;No increased pain    Behavior During Therapy  WFL for tasks assessed/performed       Past Medical History:  Diagnosis Date  . Arthritis   . Hyperthyroidism   . Hypothyroidism   . Thyroid disease     Past Surgical History:  Procedure Laterality Date  . adhesions of abdomen     from scar tissue from appendectomy  . AMPUTATION OF REPLICATED TOES     partial tip amputation of left big toe, and next toe   . APPENDECTOMY  1977  . arthroscpy      left knee -meniscus tear  . COLONOSCOPY    . STERIOD INJECTION Right 11/16/2017   Procedure: CORTISONE  INJECTION RIGHT KNEE;  Surgeon: [name], MD;  Location: [facility];  Service: Orthopedics;  Laterality: Right;  . TOE SURGERY    . TOTAL KNEE ARTHROPLASTY Left 11/16/2017   Procedure: LEFT TOTAL KNEE ARTHROPLASTY;  Surgeon: [name], MD;  Location: [facility];  Service: Orthopedics;  Laterality: Left;  . TOTAL KNEE ARTHROPLASTY Right 04/05/2018   Procedure: RIGHT TOTAL KNEE ARTHROPLASTY;  Surgeon: [name], MD;  Location: [facility];  Service: Orthopedics;  Laterality: Right;    There were no vitals filed for this visit.  Subjective Assessment - 04/14/18 1425     Subjective  Pt states that he wasn't too sore following last session. He feels he can put more weight on it.     Limitations  Standing;Walking;House hold activities    How long can you sit comfortably?  1 hour    How long can you stand comfortably?  maybe 10-15 mins    How long can you walk comfortably?  not too far    Patient Stated Goals  get in as good of shape as his L knee    Currently in Pain?  Yes    Pain Score  1     Pain Onset  1 to 4 weeks ago    Pain Frequency  Intermittent    Aggravating Factors   bending it, moving it    Pain Relieving Factors  rest and ice    Effect of Pain on Daily Activities  increases             [facility] Adult PT Treatment/Exercise - 04/14/18 0001      Exercises   Exercises  Knee/Hip      Knee/Hip Exercises: Stretches   Gastroc Stretch  Both;3 reps;30 seconds;Limitations    Gastroc Stretch Limitations  slant board      Knee/Hip Exercises: Standing  Rocker Board  2 minutes    Rocker Board Limitations  R/L    Gait Training  RW, cues for heel to toe and upright posture      Knee/Hip Exercises: Supine   Quad Sets  Right;20 reps    [employer] Limitations  3-5" holds, cues to decrease glute compensation    Short Arc [employer]  Right;20 reps    Short Arc Quad Sets Limitations  3-5" holds    Heel Slides  Right;20 reps    Heel Slides Limitations  3-5" holds    Straight Leg Raises  Right;2 sets;5 reps    Straight Leg Raises Limitations  quad set prior; very difficult for patient    Knee Extension Limitations  15    Knee Flexion Limitations  91      Manual Therapy   Manual Therapy  Edema management    Manual therapy comments  completed seperately from all other skilled interventions    Edema Management  retro massage with BLE elevated + ankle pumps             PT Education - 04/14/18 1425    Education provided  Yes    Education Details  add HS stretch to HEP    Person(s) Educated  Patient    Methods  Explanation;Demonstration     Comprehension  Verbalized understanding;Returned demonstration       PT Short Term Goals - 04/09/18 1607      PT SHORT TERM GOAL #1   Title  Pt will have improved R knee AROM from 10-100deg in order to decrease pain and improve gait.    Time  3    Period  Weeks    Status  New    Target Date  04/30/18      PT SHORT TERM GOAL #2   Title  Pt will have decreased joint line edema by 3cm or > in order to decrease pain and maximize ROM.    Time  3    Period  Weeks    Status  New      PT SHORT TERM GOAL #3   Title  Pt will have improved MMT to at least 4/5 MMT throughout all mm groups tested in order to maximize transfers, balance, and gait.    Time  3    Period  Weeks    Status  New      PT SHORT TERM GOAL #4   Title  Pt will be able to perform R SLS for 10 sec without UE support to demo improved functional strength and maximize gait.    Time  3    Period  Weeks    Status  New        PT Long Term Goals - 04/09/18 1608      PT LONG TERM GOAL #1   Title  Pt will have improved R knee AROM from 5-120deg or better in order to further decrease pain and maximize stair ambulation.    Time  6    Period  Weeks    Status  New    Target Date  05/21/18      PT LONG TERM GOAL #2   Title  Pt will have improved MMT to at least 4+/5 or better throughout all mm groups tested in order to further maximize functional mobility and return to PLOF.    Time  6    Period  Weeks    Status  New  PT LONG TERM GOAL #3   Title  Pt will be able to perform R SLS for 30 sec or > without UE support to further maximize gait and stair ambulation.     Time  6    Period  Weeks    Status  New      PT LONG TERM GOAL #4   Title  Pt will be able to ambulate 641ft during 3MWT with LRAD and gait WFL in order to demo return to PLOF and allow pt to return to his regular workout routine.    Time  6    Period  Weeks    Status  New      PT LONG TERM GOAL #5   Title  Pt will be able to perform 5xSTS in 12  sec or < with proper form to demo improved functional strength.    Time  6    Period  Weeks    Status  New            Plan - 04/14/18 1508    Clinical Impression Statement  Continued with established POC focusing on R knee ROM and edema. Pt required multimodal cuing for proper quad set but was able to demo understanding after a few reps. SLR continues to be difficult due to weakness. Cues for heel to toe gait pattern and able to add rockerboard and standing calf stretch this date for improved ROM. Ended with manual for edema management. AROM 15-91deg (was 18-90deg last session). Continue as planned, progressing as able.     Rehab Potential  Good    PT Frequency  3x / week    PT Duration  6 weeks    PT Treatment/Interventions  ADLs/Self Care Home Management;Biofeedback;Cryotherapy;Electrical Stimulation;Moist Heat;Ultrasound;DME Instruction;Gait training;Stair training;Functional mobility training;Therapeutic activities;Therapeutic exercise;Balance training;Neuromuscular re-education;Patient/family education;Manual techniques;Scar mobilization;Passive range of motion;Dry needling;Energy conservation;Taping;Compression bandaging    PT Next Visit Plan  continue focus initially on ROM and edema prior to increasing strengthening    PT Home Exercise Plan  eval: heel slides, quad set; 10/9: supine HS stretch    Consulted and Agree with Plan of Care  Patient       Patient will benefit from skilled therapeutic intervention in order to improve the following deficits and impairments:  Abnormal gait, Decreased activity tolerance, Decreased balance, Decreased endurance, Decreased mobility, Decreased range of motion, Decreased scar mobility, Decreased strength, Difficulty walking, Hypomobility, Increased edema, Increased fascial restricitons, Increased muscle spasms, Impaired flexibility, Improper body mechanics, Pain  Visit Diagnosis: Acute pain of right knee  Localized edema  Muscle weakness  (generalized)  Difficulty in walking, not elsewhere classified     Problem List Patient Active Problem List   Diagnosis Date Noted  . OA (osteoarthritis) of knee 11/16/2017       Geraldine Solar PT, DPT  [facility] [address] [place], [place], [address] Phone: [phone]   Fax:  [phone]  Name: Jonathan Ochoa MRN: [id number] Date of Birth: [birth year]

## 2018-04-16 ENCOUNTER — Encounter (HOSPITAL_COMMUNITY): Payer: Self-pay

## 2018-04-16 ENCOUNTER — Ambulatory Visit (HOSPITAL_COMMUNITY): Payer: Medicare HMO

## 2018-04-19 ENCOUNTER — Ambulatory Visit (HOSPITAL_COMMUNITY): Payer: Medicare HMO

## 2018-04-19 ENCOUNTER — Encounter (HOSPITAL_COMMUNITY): Payer: Self-pay

## 2018-04-19 DIAGNOSIS — R262 Difficulty in walking, not elsewhere classified: Secondary | ICD-10-CM | POA: Diagnosis not present

## 2018-04-19 DIAGNOSIS — M25561 Pain in right knee: Secondary | ICD-10-CM | POA: Diagnosis not present

## 2018-04-19 DIAGNOSIS — R600 Localized edema: Secondary | ICD-10-CM | POA: Diagnosis not present

## 2018-04-19 DIAGNOSIS — M6281 Muscle weakness (generalized): Secondary | ICD-10-CM | POA: Diagnosis not present

## 2018-04-19 NOTE — Therapy (Signed)
[facility] [place], [place], [address] Phone: [phone]   Fax:  [phone]  Physical Therapy Treatment  Patient Details  Name: Jonathan Ochoa MRN: [id number] Date of Birth: [birth year] Referring Provider (PT): [name], MD   Encounter Date: 04/19/2018  PT End of Session - 04/19/18 1429    Visit Number  4    Number of Visits  19    Date for PT Re-Evaluation  05/21/18   mini reassess on 04/30/18   Authorization Type  [employer] HMO    Authorization Time Period  04/09/18 to 05/21/18    Authorization - Visit Number  4    Authorization - Number of Visits  10    PT Start Time  1430    PT Stop Time  8453    PT Time Calculation (min)  44 min    Activity Tolerance  Patient tolerated treatment well;No increased pain    Behavior During Therapy  WFL for tasks assessed/performed       Past Medical History:  Diagnosis Date  . Arthritis   . Hyperthyroidism   . Hypothyroidism   . Thyroid disease     Past Surgical History:  Procedure Laterality Date  . adhesions of abdomen     from scar tissue from appendectomy  . AMPUTATION OF REPLICATED TOES     partial tip amputation of left big toe, and next toe   . APPENDECTOMY  1977  . arthroscpy      left knee -meniscus tear  . COLONOSCOPY    . STERIOD INJECTION Right 11/16/2017   Procedure: CORTISONE  INJECTION RIGHT KNEE;  Surgeon: [name], MD;  Location: [facility];  Service: Orthopedics;  Laterality: Right;  . TOE SURGERY    . TOTAL KNEE ARTHROPLASTY Left 11/16/2017   Procedure: LEFT TOTAL KNEE ARTHROPLASTY;  Surgeon: [name], MD;  Location: [facility];  Service: Orthopedics;  Laterality: Left;  . TOTAL KNEE ARTHROPLASTY Right 04/05/2018   Procedure: RIGHT TOTAL KNEE ARTHROPLASTY;  Surgeon: [name], MD;  Location: [facility];  Service: Orthopedics;  Laterality: Right;    There were no vitals filed for this visit.  Subjective Assessment - 04/19/18 1436     Subjective  Pt reports that he doesn't think he needs his RW anymore. No pain.     Limitations  Standing;Walking;House hold activities    How long can you sit comfortably?  1 hour    How long can you stand comfortably?  maybe 10-15 mins    How long can you walk comfortably?  not too far    Patient Stated Goals  get in as good of shape as his L knee    Currently in Pain?  No/denies    Pain Onset  1 to 4 weeks ago             [facility] Adult PT Treatment/Exercise - 04/19/18 0001      Exercises   Exercises  Knee/Hip      Knee/Hip Exercises: Stretches   Active Hamstring Stretch  Right;3 reps;30 seconds    Active Hamstring Stretch Limitations  standing, 12" step    Knee: Self-Stretch to increase Flexion  Right    Knee: Self-Stretch Limitations  standing, 12" step, 10x10"    Gastroc Stretch  Both;3 reps;30 seconds;Limitations    Gastroc Stretch Limitations  slant board    Other Knee/Hip Stretches  supine heel prop 2# weight x4 mins for extension  Knee/Hip Exercises: Standing   Heel Raises  Both;15 reps    Heel Raises Limitations  heel and toe    Knee Flexion  Right;10 reps    Terminal Knee Extension  Right;10 reps    Terminal Knee Extension Limitations  5" holds, RTB    Rocker Board  2 minutes    Rocker Board Limitations  R/L    Gait Training  SPC x2 laps around gym focusing on sequencing      Knee/Hip Exercises: Supine   Quad Sets  Right;20 reps    Quad Sets Limitations  long sitting, 3-5" holds, min cues to reduce glute compensation    Short Arc [employer]  Right;20 reps    [employer] Quad Sets Limitations  5"  holds    Straight Leg Raises  Right;10 reps    Straight Leg Raises Limitations  quad set prior, still difficult to keep straight entire time    Knee Extension Limitations  12    Knee Flexion Limitations  97      Manual Therapy   Manual Therapy  Edema management;Joint mobilization    Manual therapy comments  completed seperately from all other skilled  interventions    Edema Management  retro massage with BLE elevated + ankle pumps    Joint Mobilization  patellar mobs all directions for mobility            PT Education - 04/19/18 1523    Education provided  Yes    Education Details  add supine heel prop to HEP, will send today's note to MD    Person(s) Educated  Patient    Methods  Explanation;Demonstration;Handout    Comprehension  Verbalized understanding;Returned demonstration       PT Short Term Goals - 04/09/18 1607      PT SHORT TERM GOAL #1   Title  Pt will have improved R knee AROM from 10-100deg in order to decrease pain and improve gait.    Time  3    Period  Weeks    Status  New    Target Date  04/30/18      PT SHORT TERM GOAL #2   Title  Pt will have decreased joint line edema by 3cm or > in order to decrease pain and maximize ROM.    Time  3    Period  Weeks    Status  New      PT SHORT TERM GOAL #3   Title  Pt will have improved MMT to at least 4/5 MMT throughout all mm groups tested in order to maximize transfers, balance, and gait.    Time  3    Period  Weeks    Status  New      PT SHORT TERM GOAL #4   Title  Pt will be able to perform R SLS for 10 sec without UE support to demo improved functional strength and maximize gait.    Time  3    Period  Weeks    Status  New        PT Long Term Goals - 04/09/18 1608      PT LONG TERM GOAL #1   Title  Pt will have improved R knee AROM from 5-120deg or better in order to further decrease pain and maximize stair ambulation.    Time  6    Period  Weeks    Status  New    Target Date  05/21/18  PT LONG TERM GOAL #2   Title  Pt will have improved MMT to at least 4+/5 or better throughout all mm groups tested in order to further maximize functional mobility and return to PLOF.    Time  6    Period  Weeks    Status  New      PT LONG TERM GOAL #3   Title  Pt will be able to perform R SLS for 30 sec or > without UE support to further maximize gait  and stair ambulation.     Time  6    Period  Weeks    Status  New      PT LONG TERM GOAL #4   Title  Pt will be able to ambulate 646ft during 3MWT with LRAD and gait WFL in order to demo return to PLOF and allow pt to return to his regular workout routine.    Time  6    Period  Weeks    Status  New      PT LONG TERM GOAL #5   Title  Pt will be able to perform 5xSTS in 12 sec or < with proper form to demo improved functional strength.    Time  6    Period  Weeks    Status  New            Plan - 04/19/18 1524    Clinical Impression Statement  Began session with gait training with SPC. Pt with good balance throughout, however, he had difficulty sequencing UE/LE during; will continue to address this and educated pt that due to his balance, he is safe to use his SPC in the community. Continued with focus on R knee mobility and edema this date. He still compensates some with his glutes during quad sets but this is improving. Added supine heel prop to POC and HEP to address limitations in knee extension. Ended with patellar mobs and edema management. AROM 12-97deg this date. Continue as planned, progressing as able.     Rehab Potential  Good    PT Frequency  3x / week    PT Duration  6 weeks    PT Treatment/Interventions  ADLs/Self Care Home Management;Biofeedback;Cryotherapy;Electrical Stimulation;Moist Heat;Ultrasound;DME Instruction;Gait training;Stair training;Functional mobility training;Therapeutic activities;Therapeutic exercise;Balance training;Neuromuscular re-education;Patient/family education;Manual techniques;Scar mobilization;Passive range of motion;Dry needling;Energy conservation;Taping;Compression bandaging    PT Next Visit Plan  continue focus initially on ROM and edema prior to increasing strengthening    PT Home Exercise Plan  eval: heel slides, quad set; 10/9: supine HS stretch; 10/14: supine heel prop    Consulted and Agree with Plan of Care  Patient       Patient  will benefit from skilled therapeutic intervention in order to improve the following deficits and impairments:  Abnormal gait, Decreased activity tolerance, Decreased balance, Decreased endurance, Decreased mobility, Decreased range of motion, Decreased scar mobility, Decreased strength, Difficulty walking, Hypomobility, Increased edema, Increased fascial restricitons, Increased muscle spasms, Impaired flexibility, Improper body mechanics, Pain  Visit Diagnosis: Acute pain of right knee  Localized edema  Muscle weakness (generalized)  Difficulty in walking, not elsewhere classified     Problem List Patient Active Problem List   Diagnosis Date Noted  . OA (osteoarthritis) of knee 11/16/2017       Geraldine Solar PT, DPT  [facility] [address] [place], [place], [address] Phone: [phone]   Fax:  [phone]  Name: CALDEN DORSEY MRN: [id number] Date of Birth:  01/15/1951   

## 2018-04-21 ENCOUNTER — Encounter (HOSPITAL_COMMUNITY): Payer: Self-pay

## 2018-04-21 ENCOUNTER — Ambulatory Visit (HOSPITAL_COMMUNITY): Payer: Medicare HMO

## 2018-04-21 DIAGNOSIS — M6281 Muscle weakness (generalized): Secondary | ICD-10-CM | POA: Diagnosis not present

## 2018-04-21 DIAGNOSIS — R262 Difficulty in walking, not elsewhere classified: Secondary | ICD-10-CM

## 2018-04-21 DIAGNOSIS — M25561 Pain in right knee: Secondary | ICD-10-CM

## 2018-04-21 DIAGNOSIS — R600 Localized edema: Secondary | ICD-10-CM

## 2018-04-21 NOTE — Therapy (Signed)
[place], [place], [address] Phone: [phone]   Fax:  [phone]  Physical Therapy Treatment  Patient Details  Name: Jonathan Ochoa MRN: [id number] Date of Birth: 05-31-1951 Referring Provider (PT): [name], MD   Encounter Date: 04/21/2018  PT End of Session - 04/21/18 1430    Visit Number  5    Number of Visits  19    Date for PT Re-Evaluation  05/21/18   Mini-reassess on 04/30/18   Authorization Type  [employer] HMO    Authorization Time Period  04/09/18 to 05/21/18    Authorization - Visit Number  5    Authorization - Number of Visits  10    PT Start Time  7989   2' on bike, not included wiht charges   PT Stop Time  1514    PT Time Calculation (min)  49 min    Activity Tolerance  Patient tolerated treatment well;No increased pain    Behavior During Therapy  WFL for tasks assessed/performed       Past Medical History:  Diagnosis Date  . Arthritis   . Hyperthyroidism   . Hypothyroidism   . Thyroid disease     Past Surgical History:  Procedure Laterality Date  . adhesions of abdomen     from scar tissue from appendectomy  . AMPUTATION OF REPLICATED TOES     partial tip amputation of left big toe, and next toe   . APPENDECTOMY  1977  . arthroscpy      left knee -meniscus tear  . COLONOSCOPY    . STERIOD INJECTION Right 11/16/2017   Procedure: CORTISONE  INJECTION RIGHT KNEE;  Surgeon: [name], MD;  Location: [facility];  Service: Orthopedics;  Laterality: Right;  . TOE SURGERY    . TOTAL KNEE ARTHROPLASTY Left 11/16/2017   Procedure: LEFT TOTAL KNEE ARTHROPLASTY;  Surgeon: [name], MD;  Location: [facility];  Service: Orthopedics;  Laterality: Left;  . TOTAL KNEE ARTHROPLASTY Right 04/05/2018   Procedure: RIGHT TOTAL KNEE ARTHROPLASTY;  Surgeon: [name], MD;  Location: [facility];  Service: Orthopedics;  Laterality: Right;    There were no vitals filed for this  visit.  Subjective Assessment - 04/21/18 1430    Subjective  Pt stated he saw MD and is happy with knee progress.  No reports of pain today, just stiffness.      Pertinent History  Lt TKA 11/16/17 Al[place]io    Patient Stated Goals  get in as good of shape as his L knee    Currently in Pain?  No/denies         [facility] PT Assessment - 04/21/18 0001      Assessment   Medical Diagnosis  R TKA    Referring Provider (PT)  [name], MD    Onset Date/Surgical Date  04/05/18    Next MD Visit  05/18/18    Prior Therapy  none for R TKA, OPPT for L TAK earlier this year                   [facility] Adult PT Treatment/Exercise - 04/21/18 0001      Exercises   Exercises  Knee/Hip      Knee/Hip Exercises: Stretches   Active Hamstring Stretch  Right;3 reps;30 seconds    Active Hamstring Stretch Limitations  standing, 12" step    Knee: Self-Stretch to increase Flexion  Right;10 seconds    Knee: Self-Stretch Limitations  standing, 12" step, 10x10"    Gastroc Stretch  Both;3 reps;30 seconds;Limitations    Gastroc Stretch Limitations  slant board      Knee/Hip Exercises: Aerobic   Stationary Bike  x3 mins, full revolutions (not included with      Knee/Hip Exercises: Standing   Heel Raises  Both;15 reps    Heel Raises Limitations  heel and toe slope    Knee Flexion  Right;15 reps    Terminal Knee Extension  Right;15 reps    Terminal Knee Extension Limitations  5" holds, GTB    Rocker Board  2 minutes    Rocker Board Limitations  R/L and DF/PF    Gait Training  SPC x1 laps around gym focusing on sequencing; 1RT no AD cueing for equal stance phase      Knee/Hip Exercises: Seated   Long Arc Quad  Left;10 reps    Long Arc Quad Limitations  3-5" holds    Heel Slides  10 reps      Knee/Hip Exercises: Supine   Quad Sets  Right;20 reps    Short Arc [employer]  Right;20 reps    Short Arc Quad Sets Limitations  5: holds    Straight Leg Raises  Right;10 reps    Straight Leg Raises  Limitations  quad set prior, still difficult to keep straight entire time    Knee Extension  AROM    Knee Extension Limitations  8   was 12   Knee Flexion  AROM    Knee Flexion Limitations  108   was 97     Knee/Hip Exercises: Prone   Prone Knee Hang  2 minutes      Manual Therapy   Manual Therapy  Edema management;Joint mobilization    Manual therapy comments  completed seperately from all other skilled interventions    Edema Management  retro massage with BLE elevated + ankle pumps    Joint Mobilization  patellar mobs all directions for mobility               PT Short Term Goals - 04/09/18 1607      PT SHORT TERM GOAL #1   Title  Pt will have improved R knee AROM from 10-100deg in order to decrease pain and improve gait.    Time  3    Period  Weeks    Status  New    Target Date  04/30/18      PT SHORT TERM GOAL #2   Title  Pt will have decreased joint line edema by 3cm or > in order to decrease pain and maximize ROM.    Time  3    Period  Weeks    Status  New      PT SHORT TERM GOAL #3   Title  Pt will have improved MMT to at least 4/5 MMT throughout all mm groups tested in order to maximize transfers, balance, and gait.    Time  3    Period  Weeks    Status  New      PT SHORT TERM GOAL #4   Title  Pt will be able to perform R SLS for 10 sec without UE support to demo improved functional strength and maximize gait.    Time  3    Period  Weeks    Status  New        PT Long Term Goals - 04/09/18 1608      PT LONG TERM  GOAL #1   Title  Pt will have improved R knee AROM from 5-120deg or better in order to further decrease pain and maximize stair ambulation.    Time  6    Period  Weeks    Status  New    Target Date  05/21/18      PT LONG TERM GOAL #2   Title  Pt will have improved MMT to at least 4+/5 or better throughout all mm groups tested in order to further maximize functional mobility and return to PLOF.    Time  6    Period  Weeks    Status   New      PT LONG TERM GOAL #3   Title  Pt will be able to perform R SLS for 30 sec or > without UE support to further maximize gait and stair ambulation.     Time  6    Period  Weeks    Status  New      PT LONG TERM GOAL #4   Title  Pt will be able to ambulate 642ft during 3MWT with LRAD and gait WFL in order to demo return to PLOF and allow pt to return to his regular workout routine.    Time  6    Period  Weeks    Status  New      PT LONG TERM GOAL #5   Title  Pt will be able to perform 5xSTS in 12 sec or < with proper form to demo improved functional strength.    Time  6    Period  Weeks    Status  New            Plan - 04/21/18 1458    Clinical Impression Statement  Session focus on knee mobility, quad strengthening and gait training without AD.  Pt with difficulty wiht proper hand use and sequencing UE/LE with gait training.  Trail without AD with good mechanics noted, no LOB episdoes mainly cueing to improve heel strike and equalize stance phase with gait.  Added bike and prone knee hang to address deficits with knee mobility.  Pt tolerated well towards tx with no reports of pain through session.  EOS with manual to address edema present proximal knee and joint mobs for mobility.  AROM 8-108 degrees (was 12-97 last session)    Rehab Potential  Good    PT Frequency  3x / week    PT Duration  6 weeks    PT Treatment/Interventions  ADLs/Self Care Home Management;Biofeedback;Cryotherapy;Electrical Stimulation;Moist Heat;Ultrasound;DME Instruction;Gait training;Stair training;Functional mobility training;Therapeutic activities;Therapeutic exercise;Balance training;Neuromuscular re-education;Patient/family education;Manual techniques;Scar mobilization;Passive range of motion;Dry needling;Energy conservation;Taping;Compression bandaging    PT Next Visit Plan  continue focus initially on ROM and edema prior to increasing strengthening    PT Home Exercise Plan  eval: heel slides, quad  set; 10/9: supine HS stretch; 10/14: supine heel prop       Patient will benefit from skilled therapeutic intervention in order to improve the following deficits and impairments:  Abnormal gait, Decreased activity tolerance, Decreased balance, Decreased endurance, Decreased mobility, Decreased range of motion, Decreased scar mobility, Decreased strength, Difficulty walking, Hypomobility, Increased edema, Increased fascial restricitons, Increased muscle spasms, Impaired flexibility, Improper body mechanics, Pain  Visit Diagnosis: Acute pain of right knee  Localized edema  Muscle weakness (generalized)  Difficulty in walking, not elsewhere classified     Problem List Patient Active Problem List   Diagnosis Date Noted  . OA (osteoarthritis) of  knee 11/16/2017   Ihor Austin, [place]; [place]  Aldona Lento 04/21/2018, 3:19 PM  [place], [place], [address] Phone: [phone]   Fax:  [phone]  Name: HANAN MOEN MRN: [id number] Date of Birth: Mar 24, 1951

## 2018-04-23 ENCOUNTER — Encounter (HOSPITAL_COMMUNITY): Payer: Self-pay

## 2018-04-23 ENCOUNTER — Ambulatory Visit (HOSPITAL_COMMUNITY): Payer: Medicare HMO

## 2018-04-23 DIAGNOSIS — R262 Difficulty in walking, not elsewhere classified: Secondary | ICD-10-CM

## 2018-04-23 DIAGNOSIS — M25561 Pain in right knee: Secondary | ICD-10-CM

## 2018-04-23 DIAGNOSIS — R600 Localized edema: Secondary | ICD-10-CM | POA: Diagnosis not present

## 2018-04-23 DIAGNOSIS — M6281 Muscle weakness (generalized): Secondary | ICD-10-CM | POA: Diagnosis not present

## 2018-04-23 NOTE — Therapy (Signed)
[place], [place], [address] Phone: [phone]   Fax:  [phone]  Physical Therapy Treatment  Patient Details  Name: Jonathan Ochoa MRN: [id number] Date of Birth: [birth year] Referring Provider (PT): [name], MD   Encounter Date: 04/23/2018  PT End of Session - 04/23/18 1427    Visit Number  6    Number of Visits  19    Date for PT Re-Evaluation  05/21/18   Mini-reassess on 04/30/18   Authorization Type  [employer] HMO    Authorization Time Period  04/09/18 to 05/21/18    Authorization - Visit Number  6    Authorization - Number of Visits  10    PT Start Time  2992    PT Stop Time  1509    PT Time Calculation (min)  44 min    Activity Tolerance  Patient tolerated treatment well;No increased pain    Behavior During Therapy  WFL for tasks assessed/performed       Past Medical History:  Diagnosis Date  . Arthritis   . Hyperthyroidism   . Hypothyroidism   . Thyroid disease     Past Surgical History:  Procedure Laterality Date  . adhesions of abdomen     from scar tissue from appendectomy  . AMPUTATION OF REPLICATED TOES     partial tip amputation of left big toe, and next toe   . APPENDECTOMY  1977  . arthroscpy      left knee -meniscus tear  . COLONOSCOPY    . STERIOD INJECTION Right 11/16/2017   Procedure: CORTISONE  INJECTION RIGHT KNEE;  Surgeon: [name], MD;  Location: [facility];  Service: Orthopedics;  Laterality: Right;  . TOE SURGERY    . TOTAL KNEE ARTHROPLASTY Left 11/16/2017   Procedure: LEFT TOTAL KNEE ARTHROPLASTY;  Surgeon: [name], MD;  Location: [facility];  Service: Orthopedics;  Laterality: Left;  . TOTAL KNEE ARTHROPLASTY Right 04/05/2018   Procedure: RIGHT TOTAL KNEE ARTHROPLASTY;  Surgeon: [name], MD;  Location: [facility];  Service: Orthopedics;  Laterality: Right;    There were no vitals filed for this visit.  Subjective Assessment - 04/23/18 1428     Subjective  Pt states he is feeling well today, no pain, just some stiffness from sitting in the lobby.    Pertinent History  Lt TKA 11/16/17 [place]    Patient Stated Goals  get in as good of shape as his L knee    Currently in Pain?  No/denies           [facility] Adult PT Treatment/Exercise - 04/23/18 0001      Exercises   Exercises  Knee/Hip      Knee/Hip Exercises: Stretches   Active Hamstring Stretch  Right;3 reps;30 seconds    Active Hamstring Stretch Limitations  standing, 12" step    Quad Stretch  Right;3 reps;30 seconds    Quad Stretch Limitations  prone with rope    Knee: Self-Stretch to increase Flexion  Right;10 seconds    Knee: Self-Stretch Limitations  standing, 12" step, 10x10"    Gastroc Stretch  Both;3 reps;30 seconds;Limitations    Gastroc Stretch Limitations  slant board      Knee/Hip Exercises: Aerobic   Stationary Bike  x4 mins, seat 14, 1/2 and retro revolutions for mobility      Knee/Hip Exercises: Standing   Heel Raises  Both;20 reps    Heel Raises Limitations  heel and toe  slope    Knee Flexion  Right;20 reps    Terminal Knee Extension  Right;10 reps    Terminal Knee Extension Limitations  10" holds, BTB    Hip Abduction  Both;10 reps    Abduction Limitations  RTB    Lateral Step Up  Right;10 reps;Step Height: 4";Hand Hold: 2    Forward Step Up  Right;10 reps;Step Height: 4";Hand Hold: 2      Knee/Hip Exercises: Seated   Long Arc Quad  Right;15 reps    Long Arc Quad Weight  3 lbs.    Long [employer] Limitations  3-5" holds    Sit to [employer]  10 reps;without UE support      Knee/Hip Exercises: Supine   Knee Extension Limitations  8    Knee Flexion Limitations  111      Knee/Hip Exercises: Prone   Hamstring Curl  15 reps    Hamstring Curl Limitations  RLE      Manual Therapy   Manual Therapy  Edema management;Joint mobilization    Manual therapy comments  completed seperately from all other skilled interventions    Edema Management  retro massage  with BLE elevated + ankle pumps    Joint Mobilization  patellar mobs all directions for mobility; Grade II-III PA and AP mobs for improved flexion and extension ROM           PT Short Term Goals - 04/09/18 1607      PT SHORT TERM GOAL #1   Title  Pt will have improved R knee AROM from 10-100deg in order to decrease pain and improve gait.    Time  3    Period  Weeks    Status  New    Target Date  04/30/18      PT SHORT TERM GOAL #2   Title  Pt will have decreased joint line edema by 3cm or > in order to decrease pain and maximize ROM.    Time  3    Period  Weeks    Status  New      PT SHORT TERM GOAL #3   Title  Pt will have improved MMT to at least 4/5 MMT throughout all mm groups tested in order to maximize transfers, balance, and gait.    Time  3    Period  Weeks    Status  New      PT SHORT TERM GOAL #4   Title  Pt will be able to perform R SLS for 10 sec without UE support to demo improved functional strength and maximize gait.    Time  3    Period  Weeks    Status  New        PT Long Term Goals - 04/09/18 1608      PT LONG TERM GOAL #1   Title  Pt will have improved R knee AROM from 5-120deg or better in order to further decrease pain and maximize stair ambulation.    Time  6    Period  Weeks    Status  New    Target Date  05/21/18      PT LONG TERM GOAL #2   Title  Pt will have improved MMT to at least 4+/5 or better throughout all mm groups tested in order to further maximize functional mobility and return to PLOF.    Time  6    Period  Weeks    Status  New  PT LONG TERM GOAL #3   Title  Pt will be able to perform R SLS for 30 sec or > without UE support to further maximize gait and stair ambulation.     Time  6    Period  Weeks    Status  New      PT LONG TERM GOAL #4   Title  Pt will be able to ambulate 682ft during 3MWT with LRAD and gait WFL in order to demo return to PLOF and allow pt to return to his regular workout routine.    Time  6     Period  Weeks    Status  New      PT LONG TERM GOAL #5   Title  Pt will be able to perform 5xSTS in 12 sec or < with proper form to demo improved functional strength.    Time  6    Period  Weeks    Status  New            Plan - 04/23/18 1509    Clinical Impression Statement  Continued with focus on ROM and edema, while introducing him to light strengthening this date since his AROM is progressing so well. Introduced him to fwd/lat step ups, STS, and hip abd, all with good tolerance, no increased pain. Also added prone quad stretch to assist with increasing ROM and decrease restrictions in R quad. Ended with manual for edema and joint mobility in order to improve ROM. AROM 8 to 111deg this date.     Rehab Potential  Good    PT Frequency  3x / week    PT Duration  6 weeks    PT Treatment/Interventions  ADLs/Self Care Home Management;Biofeedback;Cryotherapy;Electrical Stimulation;Moist Heat;Ultrasound;DME Instruction;Gait training;Stair training;Functional mobility training;Therapeutic activities;Therapeutic exercise;Balance training;Neuromuscular re-education;Patient/family education;Manual techniques;Scar mobilization;Passive range of motion;Dry needling;Energy conservation;Taping;Compression bandaging    PT Next Visit Plan  continue focus on ROM, especially extension, and edema prior to increasing strengthening    PT Home Exercise Plan  eval: heel slides, quad set; 10/9: supine HS stretch; 10/14: supine heel prop    Consulted and Agree with Plan of Care  Patient       Patient will benefit from skilled therapeutic intervention in order to improve the following deficits and impairments:  Abnormal gait, Decreased activity tolerance, Decreased balance, Decreased endurance, Decreased mobility, Decreased range of motion, Decreased scar mobility, Decreased strength, Difficulty walking, Hypomobility, Increased edema, Increased fascial restricitons, Increased muscle spasms, Impaired  flexibility, Improper body mechanics, Pain  Visit Diagnosis: Acute pain of right knee  Localized edema  Muscle weakness (generalized)  Difficulty in walking, not elsewhere classified     Problem List Patient Active Problem List   Diagnosis Date Noted  . OA (osteoarthritis) of knee 11/16/2017       Geraldine Solar PT, DPT  [place] [address] [place], [place], [address] Phone: [phone]   Fax:  [phone]  Name: Jonathan Ochoa MRN: [id number] Date of Birth: Mar 10, 1951

## 2018-04-26 ENCOUNTER — Ambulatory Visit (HOSPITAL_COMMUNITY): Payer: Medicare HMO | Admitting: Physical Therapy

## 2018-04-26 DIAGNOSIS — M6281 Muscle weakness (generalized): Secondary | ICD-10-CM

## 2018-04-26 DIAGNOSIS — R600 Localized edema: Secondary | ICD-10-CM

## 2018-04-26 DIAGNOSIS — R262 Difficulty in walking, not elsewhere classified: Secondary | ICD-10-CM | POA: Diagnosis not present

## 2018-04-26 DIAGNOSIS — M25561 Pain in right knee: Secondary | ICD-10-CM

## 2018-04-26 NOTE — Therapy (Signed)
[place], [place], [address] Phone: [phone]   Fax:  [phone]  Physical Therapy Treatment  Patient Details  Name: Jonathan Ochoa MRN: [id number] Date of Birth: 06-02-1951 Referring Provider (PT): [name], MD   Encounter Date: 04/26/2018  PT End of Session - 04/26/18 1524    Visit Number  7    Number of Visits  19    Date for PT Re-Evaluation  05/21/18   Mini-reassess on 04/30/18   Authorization Type  [employer] HMO    Authorization Time Period  04/09/18 to 05/21/18    Authorization - Visit Number  7    Authorization - Number of Visits  10    PT Start Time  5284    PT Stop Time  1324    PT Time Calculation (min)  42 min    Activity Tolerance  Patient tolerated treatment well;No increased pain    Behavior During Therapy  WFL for tasks assessed/performed       Past Medical History:  Diagnosis Date  . Arthritis   . Hyperthyroidism   . Hypothyroidism   . Thyroid disease     Past Surgical History:  Procedure Laterality Date  . adhesions of abdomen     from scar tissue from appendectomy  . AMPUTATION OF REPLICATED TOES     partial tip amputation of left big toe, and next toe   . APPENDECTOMY  1977  . arthroscpy      left knee -meniscus tear  . COLONOSCOPY    . STERIOD INJECTION Right 11/16/2017   Procedure: CORTISONE  INJECTION RIGHT KNEE;  Surgeon: [name], MD;  Location: [facility];  Service: Orthopedics;  Laterality: Right;  . TOE SURGERY    . TOTAL KNEE ARTHROPLASTY Left 11/16/2017   Procedure: LEFT TOTAL KNEE ARTHROPLASTY;  Surgeon: [name], MD;  Location: [facility];  Service: Orthopedics;  Laterality: Left;  . TOTAL KNEE ARTHROPLASTY Right 04/05/2018   Procedure: RIGHT TOTAL KNEE ARTHROPLASTY;  Surgeon: [name], MD;  Location: [facility];  Service: Orthopedics;  Laterality: Right;    There were no vitals filed for this visit.  Subjective Assessment - 04/26/18 1443     Subjective  Pt states no pain just stiffness.    Currently in Pain?  No/denies                       [facility] Adult PT Treatment/Exercise - 04/26/18 0001      Knee/Hip Exercises: Stretches   Active Hamstring Stretch  Right;3 reps;30 seconds    Active Hamstring Stretch Limitations  standing, 12" step    Knee: Self-Stretch to increase Flexion  Right;10 seconds    Knee: Self-Stretch Limitations  standing, 12" step, 10x10"    Gastroc Stretch  Both;3 reps;30 seconds;Limitations    Gastroc Stretch Limitations  slant board      Knee/Hip Exercises: Aerobic   Stationary Bike  x4 mins, seat 14, full revolutions      Knee/Hip Exercises: Standing   Heel Raises  Both;20 reps    Heel Raises Limitations  heel and toe slope    Knee Flexion  Right;15 reps    Hip Abduction  Both;15 reps    Lateral Step Up  Right;Step Height: 4";15 reps;Hand Hold: 1    Forward Step Up  Right;Step Height: 4";15 reps;Hand Hold: 1      Knee/Hip Exercises: Supine   Quad Sets  Right;15 reps  Knee Extension Limitations  6    Knee Flexion Limitations  120      Manual Therapy   Manual Therapy  Edema management;Myofascial release    Manual therapy comments  completed seperately from all other skilled interventions    Edema Management  retro massage with BLE elevated + ankle pumps    Myofascial Release  to reduce adhesions and scar tissue.                PT Short Term Goals - 04/09/18 1607      PT SHORT TERM GOAL #1   Title  Pt will have improved R knee AROM from 10-100deg in order to decrease pain and improve gait.    Time  3    Period  Weeks    Status  New    Target Date  04/30/18      PT SHORT TERM GOAL #2   Title  Pt will have decreased joint line edema by 3cm or > in order to decrease pain and maximize ROM.    Time  3    Period  Weeks    Status  New      PT SHORT TERM GOAL #3   Title  Pt will have improved MMT to at least 4/5 MMT throughout all mm groups tested in order to  maximize transfers, balance, and gait.    Time  3    Period  Weeks    Status  New      PT SHORT TERM GOAL #4   Title  Pt will be able to perform R SLS for 10 sec without UE support to demo improved functional strength and maximize gait.    Time  3    Period  Weeks    Status  New        PT Long Term Goals - 04/09/18 1608      PT LONG TERM GOAL #1   Title  Pt will have improved R knee AROM from 5-120deg or better in order to further decrease pain and maximize stair ambulation.    Time  6    Period  Weeks    Status  New    Target Date  05/21/18      PT LONG TERM GOAL #2   Title  Pt will have improved MMT to at least 4+/5 or better throughout all mm groups tested in order to further maximize functional mobility and return to PLOF.    Time  6    Period  Weeks    Status  New      PT LONG TERM GOAL #3   Title  Pt will be able to perform R SLS for 30 sec or > without UE support to further maximize gait and stair ambulation.     Time  6    Period  Weeks    Status  New      PT LONG TERM GOAL #4   Title  Pt will be able to ambulate 645ft during 3MWT with LRAD and gait WFL in order to demo return to PLOF and allow pt to return to his regular workout routine.    Time  6    Period  Weeks    Status  New      PT LONG TERM GOAL #5   Title  Pt will be able to perform 5xSTS in 12 sec or < with proper form to demo improved functional strength.    Time  6  Period  Weeks    Status  New            Plan - 04/26/18 1525    Clinical Impression Statement  continued with primary focus on ROM with additional functional strengthening exercises began as well.  Pt required cues for posture/general form with therex.  began myofacial release to tight areas/scar tissue with ability to reduce adhesions and improve ROM.  AROM today 6-120 degrees (was 8-111 last session).  No pain or issues reported at end of session.     Rehab Potential  Good    PT Frequency  3x / week    PT Duration  6 weeks     PT Treatment/Interventions  ADLs/Self Care Home Management;Biofeedback;Cryotherapy;Electrical Stimulation;Moist Heat;Ultrasound;DME Instruction;Gait training;Stair training;Functional mobility training;Therapeutic activities;Therapeutic exercise;Balance training;Neuromuscular re-education;Patient/family education;Manual techniques;Scar mobilization;Passive range of motion;Dry needling;Energy conservation;Taping;Compression bandaging    PT Next Visit Plan  Increase functional strengthening now that AROM has improved.  Conitnue manual as needed.      PT Home Exercise Plan  eval: heel slides, quad set; 10/9: supine HS stretch; 10/14: supine heel prop    Consulted and Agree with Plan of Care  Patient       Patient will benefit from skilled therapeutic intervention in order to improve the following deficits and impairments:  Abnormal gait, Decreased activity tolerance, Decreased balance, Decreased endurance, Decreased mobility, Decreased range of motion, Decreased scar mobility, Decreased strength, Difficulty walking, Hypomobility, Increased edema, Increased fascial restricitons, Increased muscle spasms, Impaired flexibility, Improper body mechanics, Pain  Visit Diagnosis: Acute pain of right knee  Localized edema  Muscle weakness (generalized)     Problem List Patient Active Problem List   Diagnosis Date Noted  . OA (osteoarthritis) of knee 11/16/2017   Teena Irani, PTA/CLT [phone]   Teena Irani 04/26/2018, 3:30 PM  [place] [address] [place], [place], [address] Phone: [phone]   Fax:  [phone]  Name: Jonathan Ochoa MRN: [id number] Date of Birth: 08-Jun-1951

## 2018-04-28 ENCOUNTER — Ambulatory Visit (HOSPITAL_COMMUNITY): Payer: Medicare HMO

## 2018-04-28 ENCOUNTER — Encounter (HOSPITAL_COMMUNITY): Payer: Self-pay

## 2018-04-28 DIAGNOSIS — R600 Localized edema: Secondary | ICD-10-CM | POA: Diagnosis not present

## 2018-04-28 DIAGNOSIS — M6281 Muscle weakness (generalized): Secondary | ICD-10-CM

## 2018-04-28 DIAGNOSIS — R262 Difficulty in walking, not elsewhere classified: Secondary | ICD-10-CM | POA: Diagnosis not present

## 2018-04-28 DIAGNOSIS — M25561 Pain in right knee: Secondary | ICD-10-CM

## 2018-04-28 NOTE — Therapy (Signed)
[place], [place], [address] Phone: [phone]   Fax:  [phone]  Physical Therapy Treatment  Patient Details  Name: Jonathan Ochoa MRN: [id number] Date of Birth: [birth year] Referring Provider (PT): [name], MD   Encounter Date: 04/28/2018  PT End of Session - 04/28/18 1431    Visit Number  8    Number of Visits  19    Date for PT Re-Evaluation  05/21/18   Mini-reassess on 04/30/18   Authorization Type  [employer] HMO    Authorization Time Period  04/09/18 to 05/21/18    Authorization - Visit Number  8    Authorization - Number of Visits  10    PT Start Time  1430    Activity Tolerance  Patient tolerated treatment well;No increased pain    Behavior During Therapy  WFL for tasks assessed/performed       Past Medical History:  Diagnosis Date  . Arthritis   . Hyperthyroidism   . Hypothyroidism   . Thyroid disease     Past Surgical History:  Procedure Laterality Date  . adhesions of abdomen     from scar tissue from appendectomy  . AMPUTATION OF REPLICATED TOES     partial tip amputation of left big toe, and next toe   . APPENDECTOMY  1977  . arthroscpy      left knee -meniscus tear  . COLONOSCOPY    . STERIOD INJECTION Right 11/16/2017   Procedure: CORTISONE  INJECTION RIGHT KNEE;  Surgeon: [name], MD;  Location: [facility];  Service: Orthopedics;  Laterality: Right;  . TOE SURGERY    . TOTAL KNEE ARTHROPLASTY Left 11/16/2017   Procedure: LEFT TOTAL KNEE ARTHROPLASTY;  Surgeon: [name], MD;  Location: [facility];  Service: Orthopedics;  Laterality: Left;  . TOTAL KNEE ARTHROPLASTY Right 04/05/2018   Procedure: RIGHT TOTAL KNEE ARTHROPLASTY;  Surgeon: [name], MD;  Location: [facility];  Service: Orthopedics;  Laterality: Right;    There were no vitals filed for this visit.  Subjective Assessment - 04/28/18 1512    Subjective  Pt reports his cane was more awkward to walk  wiht it so he has not been using it.     Currently in Pain?  No/denies            [facility] Adult PT Treatment/Exercise - 04/28/18 0001      Exercises   Exercises  Knee/Hip      Knee/Hip Exercises: Stretches   Active Hamstring Stretch  Right;3 reps;30 seconds    Active Hamstring Stretch Limitations  standing, 12" step    Quad Stretch  Right;3 reps;30 seconds    Quad Stretch Limitations  prone with rope    Knee: Self-Stretch to increase Flexion  Right;5 reps;10 seconds    Knee: Self-Stretch Limitations  standing, 12" step    Gastroc Stretch  Both;3 reps;30 seconds;Limitations    Gastroc Stretch Limitations  slant board      Knee/Hip Exercises: Aerobic   Stationary Bike  x4 mins, seat 12, 1/2, retro, full revolutions      Knee/Hip Exercises: Standing   Forward Lunges  Both;10 reps    Hip Abduction  Both;15 reps    Abduction Limitations  GTB    Lateral Step Up  Right;15 reps;Step Height: 6";Hand Hold: 0    Forward Step Up  Right;15 reps;Step Height: 6";Hand Hold: 0    Step Down  Right;15 reps;Step Height: 6";Hand Hold: 2  Functional Squat  15 reps    Functional Squat Limitations  min cues for form    SLS  RLE 3x15"    Other Standing Knee Exercises  lat heel taps 4" step 2x15reps      Knee/Hip Exercises: Supine   Knee Extension Limitations  5    Knee Flexion Limitations  117      Knee/Hip Exercises: Prone   Other Prone Exercises  R TKE 10x10" holds      Manual Therapy   Manual Therapy  Joint mobilization    Manual therapy comments  completed seperately from all other skilled interventions    Joint Mobilization  patellar mobs all directions for mobility; Grade II-III PA and AP mobs for improved flexion and extension ROM; end range extension tibial ER               PT Short Term Goals - 04/09/18 1607      PT SHORT TERM GOAL #1   Title  Pt will have improved R knee AROM from 10-100deg in order to decrease pain and improve gait.    Time  3    Period  Weeks     Status  New    Target Date  04/30/18      PT SHORT TERM GOAL #2   Title  Pt will have decreased joint line edema by 3cm or > in order to decrease pain and maximize ROM.    Time  3    Period  Weeks    Status  New      PT SHORT TERM GOAL #3   Title  Pt will have improved MMT to at least 4/5 MMT throughout all mm groups tested in order to maximize transfers, balance, and gait.    Time  3    Period  Weeks    Status  New      PT SHORT TERM GOAL #4   Title  Pt will be able to perform R SLS for 10 sec without UE support to demo improved functional strength and maximize gait.    Time  3    Period  Weeks    Status  New        PT Long Term Goals - 04/09/18 1608      PT LONG TERM GOAL #1   Title  Pt will have improved R knee AROM from 5-120deg or better in order to further decrease pain and maximize stair ambulation.    Time  6    Period  Weeks    Status  New    Target Date  05/21/18      PT LONG TERM GOAL #2   Title  Pt will have improved MMT to at least 4+/5 or better throughout all mm groups tested in order to further maximize functional mobility and return to PLOF.    Time  6    Period  Weeks    Status  New      PT LONG TERM GOAL #3   Title  Pt will be able to perform R SLS for 30 sec or > without UE support to further maximize gait and stair ambulation.     Time  6    Period  Weeks    Status  New      PT LONG TERM GOAL #4   Title  Pt will be able to ambulate 653ft during 3MWT with LRAD and gait WFL in order to demo return to PLOF and allow pt  to return to his regular workout routine.    Time  6    Period  Weeks    Status  New      PT LONG TERM GOAL #5   Title  Pt will be able to perform 5xSTS in 12 sec or < with proper form to demo improved functional strength.    Time  6    Period  Weeks    Status  New            Plan - 04/28/18 1510    Clinical Impression Statement  Continued with established POC focusing on ROM (mainly extension) and strengthening. Able  to progress pt in strengthening some since his AROM has started to normalize. Progressed him in step height and began SLS, step downs, mini squats, and lunges this date, all with good tolerance, just reports of minor pulling at anterior knee during step downs. Also added prone TKE for continued quad strength to improve knee extension ROM. Ended with manual joint mobs for joint mobility. AROM 5 to 117deg this date. Pt due for reassessment next visit.    Rehab Potential  Good    PT Frequency  3x / week    PT Duration  6 weeks    PT Treatment/Interventions  ADLs/Self Care Home Management;Biofeedback;Cryotherapy;Electrical Stimulation;Moist Heat;Ultrasound;DME Instruction;Gait training;Stair training;Functional mobility training;Therapeutic activities;Therapeutic exercise;Balance training;Neuromuscular re-education;Patient/family education;Manual techniques;Scar mobilization;Passive range of motion;Dry needling;Energy conservation;Taping;Compression bandaging    PT Next Visit Plan  reassess; continue functional strengthening now that AROM has improved.  Conitnue manual as needed.      PT Home Exercise Plan  eval: heel slides, quad set; 10/9: supine HS stretch; 10/14: supine heel prop; 10/23: prone quad stretch, TKE     Consulted and Agree with Plan of Care  Patient       Patient will benefit from skilled therapeutic intervention in order to improve the following deficits and impairments:  Abnormal gait, Decreased activity tolerance, Decreased balance, Decreased endurance, Decreased mobility, Decreased range of motion, Decreased scar mobility, Decreased strength, Difficulty walking, Hypomobility, Increased edema, Increased fascial restricitons, Increased muscle spasms, Impaired flexibility, Improper body mechanics, Pain  Visit Diagnosis: Acute pain of right knee  Localized edema  Muscle weakness (generalized)  Difficulty in walking, not elsewhere classified     Problem List Patient Active  Problem List   Diagnosis Date Noted  . OA (osteoarthritis) of knee 11/16/2017        Geraldine Solar PT, DPT  [place] [address] [place], [place], [address] Phone: [phone]   Fax:  [phone]  Name: Jonathan Ochoa MRN: [id number] Date of Birth: 1951/05/06

## 2018-04-30 ENCOUNTER — Encounter (HOSPITAL_COMMUNITY): Payer: Self-pay

## 2018-04-30 ENCOUNTER — Ambulatory Visit (HOSPITAL_COMMUNITY): Payer: Medicare HMO

## 2018-04-30 DIAGNOSIS — R262 Difficulty in walking, not elsewhere classified: Secondary | ICD-10-CM

## 2018-04-30 DIAGNOSIS — M25561 Pain in right knee: Secondary | ICD-10-CM

## 2018-04-30 DIAGNOSIS — R600 Localized edema: Secondary | ICD-10-CM | POA: Diagnosis not present

## 2018-04-30 DIAGNOSIS — M6281 Muscle weakness (generalized): Secondary | ICD-10-CM

## 2018-04-30 NOTE — Therapy (Signed)
[name], [place], [address] Phone: [phone]   Fax:  [phone]   Progress Note Reporting Period 04/09/18 to 04/30/18  See note below for Objective Data and Assessment of Progress/Goals.   Physical Therapy Treatment  Patient Details  Name: Jonathan Ochoa MRN: [id number] Date of Birth: 1951/06/09 Referring Provider (PT): [name], MD   Encounter Date: 04/30/2018  PT End of Session - 04/30/18 1432    Visit Number  9    Number of Visits  19    Date for PT Re-Evaluation  05/21/18   Mini-reassess completed 04/30/18   Authorization Type  [employer] HMO    Authorization Time Period  04/09/18 to 05/21/18    Authorization - Visit Number  9    Authorization - Number of Visits  19    PT Start Time  1430    PT Stop Time  9826    PT Time Calculation (min)  44 min    Activity Tolerance  Patient tolerated treatment well;No increased pain    Behavior During Therapy  WFL for tasks assessed/performed       Past Medical History:  Diagnosis Date  . Arthritis   . Hyperthyroidism   . Hypothyroidism   . Thyroid disease     Past Surgical History:  Procedure Laterality Date  . adhesions of abdomen     from scar tissue from appendectomy  . AMPUTATION OF REPLICATED TOES     partial tip amputation of left big toe, and next toe   . APPENDECTOMY  1977  . arthroscpy      left knee -meniscus tear  . COLONOSCOPY    . STERIOD INJECTION Right 11/16/2017   Procedure: CORTISONE  INJECTION RIGHT KNEE;  Surgeon: [name], MD;  Location: [facility];  Service: Orthopedics;  Laterality: Right;  . TOE SURGERY    . TOTAL KNEE ARTHROPLASTY Left 11/16/2017   Procedure: LEFT TOTAL KNEE ARTHROPLASTY;  Surgeon: [name], MD;  Location: [facility];  Service: Orthopedics;  Laterality: Left;  . TOTAL KNEE ARTHROPLASTY Right 04/05/2018   Procedure: RIGHT TOTAL KNEE ARTHROPLASTY;  Surgeon: [name], MD;  Location: [facility];   Service: Orthopedics;  Laterality: Right;    There were no vitals filed for this visit.  Subjective Assessment - 04/30/18 1432    Subjective  pt states he is doing well. No pain or new updates.     Currently in Pain?  No/denies         [facility] PT Assessment - 04/30/18 0001      Assessment   Medical Diagnosis  R TKA    Referring Provider (PT)  [name], MD    Onset Date/Surgical Date  04/05/18    Next MD Visit  05/18/18    Prior Therapy  none for R TKA, OPPT for L TAK earlier this year      Circumferential Edema   Circumferential - Right  44cm, joint line   was 47.5cm, joint line   Circumferential - Left   42.5cm, joint line      AROM   Right Knee Extension  7   was 21   Right Knee Flexion  120   was 88     Strength   Right Hip Flexion  5/5   was 3-   Right Hip Extension  4/5   was 4-   Right Hip ABduction  4/5   was 4-   Left Hip Extension  4+/5   was 4+   Left Hip ABduction  4+/5   was 4   Right Knee Flexion  5/5   was 4-   Right Knee Extension  5/5   was 2+   Right Ankle Dorsiflexion  4+/5   was 4     Ambulation/Gait   Ambulation Distance (Feet)  774 Feet   3MWT; was 243f with RW   Assistive device  None    Gait Pattern  Step-through pattern;Antalgic;Lateral trunk lean to right   mild limp on RLE     Balance   Balance Assessed  Yes      Static Standing Balance   Static Standing - Balance Support  No upper extremity supported    Static Standing Balance -  Activities   Single Leg Stance - Right Leg;Single Leg Stance - Left Leg    Static Standing - Comment/# of Minutes  R: 5.5sec or <   was 0sec     Standardized Balance Assessment   Standardized Balance Assessment  Five Times Sit to Stand    Five times sit to stand comments   8.36sec, chair, no UE   was 22.2sec, RLE extended, BUE support            [facility] Adult PT Treatment/Exercise - 04/30/18 0001      Modalities   Modalities  Electrical Stimulation      Electrical Stimulation    Electrical Stimulation Location  R VMO and rectus femoris    Electrical Stimulation Action  mm contraction    Electrical Stimulation Parameters  Russian x10 mins; 10/30 cycle time, 2 sec ramp    Electrical Stimulation Goals  Neuromuscular facilitation;Strength             PT Short Term Goals - 04/30/18 1434      PT SHORT TERM GOAL #1   Title  Pt will have improved R knee AROM from 10-100deg in order to decrease pain and improve gait.    Baseline  10/25: 7-120    Time  3    Period  Weeks    Status  Achieved      PT SHORT TERM GOAL #2   Title  Pt will have decreased joint line edema by 3cm or > in order to decrease pain and maximize ROM.    Time  3    Period  Weeks    Status  Achieved      PT SHORT TERM GOAL #3   Title  Pt will have improved MMT to at least 4/5 MMT throughout all mm groups tested in order to maximize transfers, balance, and gait.    Time  3    Period  Weeks    Status  New      PT SHORT TERM GOAL #4   Title  Pt will be able to perform R SLS for 10 sec without UE support to demo improved functional strength and maximize gait.    Baseline  10/25: 5.5sec    Time  3    Period  Weeks    Status  On-going        PT Long Term Goals - 04/30/18 1435      PT LONG TERM GOAL #1   Title  Pt will have improved R knee AROM from 0-120deg or better in order to further decrease pain and maximize stair ambulation.    Baseline  10/25: 5-120    Time  6    Period  Weeks  Status  Revised      PT LONG TERM GOAL #2   Title  Pt will have improved MMT to at least 4+/5 or better throughout all mm groups tested in order to further maximize functional mobility and return to PLOF.    Time  6    Period  Weeks    Status  Partially Met      PT LONG TERM GOAL #3   Title  Pt will be able to perform R SLS for 30 sec or > without UE support to further maximize gait and stair ambulation.     Baseline  10/25: 5.5sec RLE    Time  6    Period  Weeks    Status  On-going      PT  LONG TERM GOAL #4   Title  Pt will be able to ambulate 617f during 3MWT with LRAD and gait WFL in order to demo return to PLOF and allow pt to return to his regular workout routine.    Baseline  10/25: 7747fno AD, gait slightly limited    Time  6    Period  Weeks    Status  Partially Met      PT LONG TERM GOAL #5   Title  Pt will be able to perform 5xSTS in 12 sec or < with proper form to demo improved functional strength.    Baseline  10/25: 8.3sec    Time  6    Period  Weeks    Status  Achieved            Plan - 04/30/18 1515    Clinical Impression Statement  PT reassessed pt's goals and outcome measures this date. Pt has made great progress towards goals as illustrated above. His AORM was 7-120deg, his strength has improved to at least 4/5 throughout and his 5xSTS time improved to 8.3 sec. His gait endurance has significantly improved, but he still demo's slight gait pattern deviations. He is still very challenged with SLS as he could only perform for 5.5sec today on RLE, but this is improved from 0sec at eval 3WA. Overall, pt has progressed nicely but needs continued skilled PT intervention to address remaining impairments and knee extension ROM in order to promote return to PLOF. Pt still demo's difficulty with proper quad set so introduced him to [employer] for NMR of proper quad contraction. Knee extension AROM following [place] 5deg. Will continue with pt 3x/week for now, but will decrease frequency to 2x/week as needed/when able.     Rehab Potential  Good    PT Frequency  3x / week    PT Duration  6 weeks    PT Treatment/Interventions  ADLs/Self Care Home Management;Biofeedback;Cryotherapy;Electrical Stimulation;Moist Heat;Ultrasound;DME Instruction;Gait training;Stair training;Functional mobility training;Therapeutic activities;Therapeutic exercise;Balance training;Neuromuscular re-education;Patient/family education;Manual techniques;Scar mobilization;Passive range  of motion;Dry needling;Energy conservation;Taping;Compression bandaging    PT Next Visit Plan  continue functional strengthening now that AROM has improved.  Conitnue manual as needed.      PT Home Exercise Plan  eval: heel slides, quad set; 10/9: supine HS stretch; 10/14: supine heel prop; 10/23: prone quad stretch, TKE; 10/25: prone knee hang    Consulted and Agree with Plan of Care  Patient       Patient will benefit from skilled therapeutic intervention in order to improve the following deficits and impairments:  Abnormal gait, Decreased activity tolerance, Decreased balance, Decreased endurance, Decreased mobility, Decreased range of motion, Decreased scar mobility, Decreased strength,  Difficulty walking, Hypomobility, Increased edema, Increased fascial restricitons, Increased muscle spasms, Impaired flexibility, Improper body mechanics, Pain  Visit Diagnosis: Acute pain of right knee  Localized edema  Muscle weakness (generalized)  Difficulty in walking, not elsewhere classified     Problem List Patient Active Problem List   Diagnosis Date Noted  . OA (osteoarthritis) of knee 11/16/2017        Geraldine Solar PT, DPT  [facility] [address] [place], [place], [address] Phone: [phone]   Fax:  [phone]  Name: TRYONE KILLE MRN: [id number] Date of Birth: February 17, 1951

## 2018-05-03 ENCOUNTER — Encounter (HOSPITAL_COMMUNITY): Payer: Self-pay

## 2018-05-03 ENCOUNTER — Ambulatory Visit (HOSPITAL_COMMUNITY): Payer: Medicare HMO

## 2018-05-03 DIAGNOSIS — M6281 Muscle weakness (generalized): Secondary | ICD-10-CM

## 2018-05-03 DIAGNOSIS — R600 Localized edema: Secondary | ICD-10-CM

## 2018-05-03 DIAGNOSIS — M25561 Pain in right knee: Secondary | ICD-10-CM

## 2018-05-03 DIAGNOSIS — R262 Difficulty in walking, not elsewhere classified: Secondary | ICD-10-CM | POA: Diagnosis not present

## 2018-05-03 NOTE — Therapy (Signed)
[address] [place], [place], [address] Phone: [phone]   Fax:  [phone]  Physical Therapy Treatment  Patient Details  Name: Jonathan Ochoa MRN: [id number] Date of Birth: 1950/09/04 Referring Provider (PT): [name], MD   Encounter Date: 05/03/2018  PT End of Session - 05/03/18 1436    Visit Number  10    Number of Visits  19    Date for PT Re-Evaluation  05/21/18   Mini-reassess completed 04/30/18   Authorization Type  [employer] HMO    Authorization Time Period  04/09/18 to 05/21/18    Authorization - Visit Number  10    Authorization - Number of Visits  19    PT Start Time  4742    PT Stop Time  5956    PT Time Calculation (min)  42 min    Activity Tolerance  Patient tolerated treatment well;No increased pain    Behavior During Therapy  WFL for tasks assessed/performed       Past Medical History:  Diagnosis Date  . Arthritis   . Hyperthyroidism   . Hypothyroidism   . Thyroid disease     Past Surgical History:  Procedure Laterality Date  . adhesions of abdomen     from scar tissue from appendectomy  . AMPUTATION OF REPLICATED TOES     partial tip amputation of left big toe, and next toe   . APPENDECTOMY  1977  . arthroscpy      left knee -meniscus tear  . COLONOSCOPY    . STERIOD INJECTION Right 11/16/2017   Procedure: CORTISONE  INJECTION RIGHT KNEE;  Surgeon: [name], MD;  Location: [facility];  Service: Orthopedics;  Laterality: Right;  . TOE SURGERY    . TOTAL KNEE ARTHROPLASTY Left 11/16/2017   Procedure: LEFT TOTAL KNEE ARTHROPLASTY;  Surgeon: [name], MD;  Location: [facility];  Service: Orthopedics;  Laterality: Left;  . TOTAL KNEE ARTHROPLASTY Right 04/05/2018   Procedure: RIGHT TOTAL KNEE ARTHROPLASTY;  Surgeon: [name], MD;  Location: [facility];  Service: Orthopedics;  Laterality: Right;    There were no vitals filed for this visit.  Subjective Assessment -  05/03/18 1437    Subjective  Pt states he had a good weekend. He was not sore following the [place] estim.     Currently in Pain?  No/denies            [facility] Adult PT Treatment/Exercise - 05/03/18 0001      Exercises   Exercises  Knee/Hip      Knee/Hip Exercises: Aerobic   Stationary Bike  x4 mins, seat 12, 1/2, retro, full revolutions      Knee/Hip Exercises: Standing   Terminal Knee Extension  Right    Terminal Knee Extension Limitations  x3 mins during [place] estim      Knee/Hip Exercises: Supine   Quad Sets  Right    Quad Sets Limitations  during [place] estim x2 mins    Short Arc [employer] Sets  Right    Short Arc Quad Sets Limitations  during [place] estim x5 mins    Straight Leg Raises  Right;2 sets;10 reps    Straight Leg Raises Limitations  with ER    Knee Extension Limitations  4    Knee Flexion Limitations  120      Knee/Hip Exercises: Prone   Other Prone Exercises  R TKE 10x10" holds      Modalities   Modalities  Teacher, English as a foreign language Location  R VMO and rectus femoris    Electrical Stimulation Action  mm contraction    Electrical Stimulation Parameters  Russian x10 mins; 10/30 cycle time, 2sec ramp; ~54m    Electrical Stimulation Goals  Neuromuscular facilitation;Strength      Manual Therapy   Manual Therapy  Joint mobilization    Manual therapy comments  completed seperately from all other skilled interventions    Joint Mobilization  patellar mobs all directions for mobility; Grade II-III PA and AP mobs for improved flexion and extension ROM; end range extension tibial ER             PT Education - 05/03/18 1525    Education provided  Yes    Education Details  proper quad set, continue HEP    Person(s) Educated  Patient    Methods  Explanation;Demonstration    Comprehension  Verbalized understanding;Returned demonstration;Tactile cues required       PT Short Term Goals - 04/30/18 1434       PT SHORT TERM GOAL #1   Title  Pt will have improved R knee AROM from 10-100deg in order to decrease pain and improve gait.    Baseline  10/25: 7-120    Time  3    Period  Weeks    Status  Achieved      PT SHORT TERM GOAL #2   Title  Pt will have decreased joint line edema by 3cm or > in order to decrease pain and maximize ROM.    Time  3    Period  Weeks    Status  Achieved      PT SHORT TERM GOAL #3   Title  Pt will have improved MMT to at least 4/5 MMT throughout all mm groups tested in order to maximize transfers, balance, and gait.    Time  3    Period  Weeks    Status  New      PT SHORT TERM GOAL #4   Title  Pt will be able to perform R SLS for 10 sec without UE support to demo improved functional strength and maximize gait.    Baseline  10/25: 5.5sec    Time  3    Period  Weeks    Status  On-going        PT Long Term Goals - 04/30/18 1435      PT LONG TERM GOAL #1   Title  Pt will have improved R knee AROM from 0-120deg or better in order to further decrease pain and maximize stair ambulation.    Baseline  10/25: 5-120    Time  6    Period  Weeks    Status  Revised      PT LONG TERM GOAL #2   Title  Pt will have improved MMT to at least 4+/5 or better throughout all mm groups tested in order to further maximize functional mobility and return to PLOF.    Time  6    Period  Weeks    Status  Partially Met      PT LONG TERM GOAL #3   Title  Pt will be able to perform R SLS for 30 sec or > without UE support to further maximize gait and stair ambulation.     Baseline  10/25: 5.5sec RLE    Time  6    Period  Weeks  Status  On-going      PT LONG TERM GOAL #4   Title  Pt will be able to ambulate 656f during 3MWT with LRAD and gait WFL in order to demo return to PLOF and allow pt to return to his regular workout routine.    Baseline  10/25: 7757fno AD, gait slightly limited    Time  6    Period  Weeks    Status  Partially Met      PT LONG TERM GOAL #5    Title  Pt will be able to perform 5xSTS in 12 sec or < with proper form to demo improved functional strength.    Baseline  10/25: 8.3sec    Time  6    Period  Weeks    Status  Achieved            Plan - 05/03/18 1524    Clinical Impression Statement  Most of today's session focused on quad activation and quad strength. Continued with [place] during SAQ, quad sets, and standing TKE. Then initiated SLR and SAQ with ER; pt very challenged with SLR with ER due to weakness. Ended with joint mobs for improved joint mobility. He tends to try and ER hip to compensate for lack of knee extension but PT cued to correct this. AROM at EOS 4 to 120deg. Educated him that he may likely have increased soreness in R quad as that was the entire focus of today's session and he verbalized understanding. Continue as planned, progressing as able.     Rehab Potential  Good    PT Frequency  3x / week    PT Duration  6 weeks    PT Treatment/Interventions  ADLs/Self Care Home Management;Biofeedback;Cryotherapy;Electrical Stimulation;Moist Heat;Ultrasound;DME Instruction;Gait training;Stair training;Functional mobility training;Therapeutic activities;Therapeutic exercise;Balance training;Neuromuscular re-education;Patient/family education;Manual techniques;Scar mobilization;Passive range of motion;Dry needling;Energy conservation;Taping;Compression bandaging    PT Next Visit Plan  continue Ru[place] functional strengthening now that AROM has improved.  Conitnue manual as needed.      PT Home Exercise Plan  eval: heel slides, quad set; 10/9: supine HS stretch; 10/14: supine heel prop; 10/23: prone quad stretch, TKE; 10/25: prone knee hang    Consulted and Agree with Plan of Care  Patient       Patient will benefit from skilled therapeutic intervention in order to improve the following deficits and impairments:  Abnormal gait, Decreased activity tolerance, Decreased balance, Decreased endurance,  Decreased mobility, Decreased range of motion, Decreased scar mobility, Decreased strength, Difficulty walking, Hypomobility, Increased edema, Increased fascial restricitons, Increased muscle spasms, Impaired flexibility, Improper body mechanics, Pain  Visit Diagnosis: Acute pain of right knee  Localized edema  Muscle weakness (generalized)  Difficulty in walking, not elsewhere classified     Problem List Patient Active Problem List   Diagnosis Date Noted  . OA (osteoarthritis) of knee 11/16/2017        BrGeraldine SolarT, DPT  [address]hone: [phone] Fax:  [phone]: Jonathan TURMANRN: [id number] of Birth: 08/1950-02-17

## 2018-05-05 ENCOUNTER — Ambulatory Visit (HOSPITAL_COMMUNITY): Payer: Medicare HMO

## 2018-05-05 ENCOUNTER — Encounter (HOSPITAL_COMMUNITY): Payer: Self-pay

## 2018-05-05 DIAGNOSIS — R262 Difficulty in walking, not elsewhere classified: Secondary | ICD-10-CM | POA: Diagnosis not present

## 2018-05-05 DIAGNOSIS — R600 Localized edema: Secondary | ICD-10-CM

## 2018-05-05 DIAGNOSIS — M6281 Muscle weakness (generalized): Secondary | ICD-10-CM | POA: Diagnosis not present

## 2018-05-05 DIAGNOSIS — M25561 Pain in right knee: Secondary | ICD-10-CM | POA: Diagnosis not present

## 2018-05-05 NOTE — Patient Instructions (Signed)
Straight Leg Raise: With External Leg Rotation    Lie on back with right leg straight, opposite leg bent. Rotate straight leg out and lift 12 inches. Repeat 10-20 times per set. Do 1-2 sets per session.   http://orth.exer.us/728   Copyright  VHI. All rights reserved.

## 2018-05-05 NOTE — Therapy (Signed)
Jonathan Ochoa, [place], [address] Phone: [phone]   Fax:  [phone]  Physical Therapy Treatment  Patient Details  Name: Jonathan Ochoa MRN: [id number] Date of Birth: [birth year] Referring Provider (PT): [name], MD   Encounter Date: 05/05/2018  PT End of Session - 05/05/18 1441    Visit Number  11    Number of Visits  19    Date for PT Re-Evaluation  05/21/18   MInireassess complete 04/30/18   Authorization Type  [employer] Medicare HMO    Authorization Time Period  04/09/18 to 05/21/18    Authorization - Visit Number  11    Authorization - Number of Visits  19    PT Start Time  6440    PT Stop Time  1520   EOS on bike, not included with charges   PT Time Calculation (min)  50 min    Activity Tolerance  Patient tolerated treatment well;No increased pain    Behavior During Therapy  WFL for tasks assessed/performed       Past Medical History:  Diagnosis Date  . Arthritis   . Hyperthyroidism   . Hypothyroidism   . Thyroid disease     Past Surgical History:  Procedure Laterality Date  . adhesions of abdomen     from scar tissue from appendectomy  . AMPUTATION OF REPLICATED TOES     partial tip amputation of left big toe, and next toe   . APPENDECTOMY  1977  . arthroscpy      left knee -meniscus tear  . COLONOSCOPY    . STERIOD INJECTION Right 11/16/2017   Procedure: CORTISONE  INJECTION RIGHT KNEE;  Surgeon: [name], MD;  Location: [facility];  Service: Orthopedics;  Laterality: Right;  . TOE SURGERY    . TOTAL KNEE ARTHROPLASTY Left 11/16/2017   Procedure: LEFT TOTAL KNEE ARTHROPLASTY;  Surgeon: [name], MD;  Location: [facility];  Service: Orthopedics;  Laterality: Left;  . TOTAL KNEE ARTHROPLASTY Right 04/05/2018   Procedure: RIGHT TOTAL KNEE ARTHROPLASTY;  Surgeon: [name], MD;  Location: [facility];  Service: Orthopedics;  Laterality: Right;    There were no vitals filed for this  visit.  Subjective Assessment - 05/05/18 1438    Subjective  Pt stated knee is feeling good today, continues to have difficulty with quad sets getting that knee straight    Patient Stated Goals  get in as good of shape as his L knee    Currently in Pain?  No/denies                       [facility] Adult PT Treatment/Exercise - 05/05/18 0001      Exercises   Exercises  Knee/Hip      Knee/Hip Exercises: Aerobic   Stationary Bike  x4 mins at EOS, seat 12, full revolutions      Knee/Hip Exercises: Machines for Strengthening   Cybex Leg Press  Leg press 5PL 15x      Knee/Hip Exercises: Standing   Terminal Knee Extension  Right;15 reps    Terminal Knee Extension Limitations  10" holds    Lateral Step Up  Right;15 reps;Hand Hold: 1;Step Height: 6"    Forward Step Up  Right;15 reps;Step Height: 6";Hand Hold: 0    Wall Squat  10 reps;5 seconds    Wall Squat Limitations  ball between knees for VMO    Stairs  5RT reciprocal 1 HR  Knee/Hip Exercises: Supine   Quad Sets  Right    Quad Sets Limitations  during [place] estim    Short Arc Scientific laboratory technician Sets Limitations  1st set with russian estim    Straight Leg Raises  Right;10 reps;3 sets    Straight Leg Raises Limitations  1st set with russian estim; 3rd set with ER following initial quad set    Knee Extension  AROM    Knee Extension Limitations  4    Knee Flexion  AROM    Knee Flexion Limitations  121      Knee/Hip Exercises: Prone   Other Prone Exercises  R TKE 10x10" holds      Modalities   Modalities  Electrical Stimulation      Electrical Stimulation   Electrical Stimulation Location  R VMO and rectus femoris    Electrical Stimulation Action  muscle contraction    Electrical Stimulation Parameters  Russian x 28mn; 10/30 cycle time, 2 sec ramp    Electrical Stimulation Goals  Neuromuscular facilitation;Strength      Manual Therapy   Manual Therapy  Joint mobilization    Manual  therapy comments  completed seperately from all other skilled interventions    Joint Mobilization  patellar mobs all directions for mobility; Grade II-III PA and AP mobs for improved flexion and extension ROM; end range extension tibial ER               PT Short Term Goals - 04/30/18 1434      PT SHORT TERM GOAL #1   Title  Pt will have improved R knee AROM from 10-100deg in order to decrease pain and improve gait.    Baseline  10/25: 7-120    Time  3    Period  Weeks    Status  Achieved      PT SHORT TERM GOAL #2   Title  Pt will have decreased joint line edema by 3cm or > in order to decrease pain and maximize ROM.    Time  3    Period  Weeks    Status  Achieved      PT SHORT TERM GOAL #3   Title  Pt will have improved MMT to at least 4/5 MMT throughout all mm groups tested in order to maximize transfers, balance, and gait.    Time  3    Period  Weeks    Status  New      PT SHORT TERM GOAL #4   Title  Pt will be able to perform R SLS for 10 sec without UE support to demo improved functional strength and maximize gait.    Baseline  10/25: 5.5sec    Time  3    Period  Weeks    Status  On-going        PT Long Term Goals - 04/30/18 1435      PT LONG TERM GOAL #1   Title  Pt will have improved R knee AROM from 0-120deg or better in order to further decrease pain and maximize stair ambulation.    Baseline  10/25: 5-120    Time  6    Period  Weeks    Status  Revised      PT LONG TERM GOAL #2   Title  Pt will have improved MMT to at least 4+/5 or better throughout all mm groups tested in order to further maximize functional mobility and  return to PLOF.    Time  6    Period  Weeks    Status  Partially Met      PT LONG TERM GOAL #3   Title  Pt will be able to perform R SLS for 30 sec or > without UE support to further maximize gait and stair ambulation.     Baseline  10/25: 5.5sec RLE    Time  6    Period  Weeks    Status  On-going      PT LONG TERM GOAL #4    Title  Pt will be able to ambulate 673f during 3MWT with LRAD and gait WFL in order to demo return to PLOF and allow pt to return to his regular workout routine.    Baseline  10/25: 7783fno AD, gait slightly limited    Time  6    Period  Weeks    Status  Partially Met      PT LONG TERM GOAL #5   Title  Pt will be able to perform 5xSTS in 12 sec or < with proper form to demo improved functional strength.    Baseline  10/25: 8.3sec    Time  6    Period  Weeks    Status  Achieved            Plan - 05/05/18 1442    Clinical Impression Statement  Continued session focus iwht quad activation and strengthening.  Resumed [place] for quad strengthening during SAQ and SLR.  Pt able to complete SLR without extension lag though does continue to be limited with 4 degrees from full extension.  Pt does continue to show VMO weakness, given SLR with ER as additional HEP.  Added wall squats with ball squeeze for VMO activation/strengthening as well as resumed step up/stair training.  No reports of pain through session, was limited by fatigue.  AROM 4-121 degrees    Rehab Potential  Good    PT Frequency  3x / week    PT Duration  6 weeks    PT Treatment/Interventions  ADLs/Self Care Home Management;Biofeedback;Cryotherapy;Electrical Stimulation;Moist Heat;Ultrasound;DME Instruction;Gait training;Stair training;Functional mobility training;Therapeutic activities;Therapeutic exercise;Balance training;Neuromuscular re-education;Patient/family education;Manual techniques;Scar mobilization;Passive range of motion;Dry needling;Energy conservation;Taping;Compression bandaging    PT Next Visit Plan  continue [place] functional strengthening now that AROM has improved.  Conitnue manual as needed.      PT Home Exercise Plan  eval: heel slides, quad set; 10/9: supine HS stretch; 10/14: supine heel prop; 10/23: prone quad stretch, TKE; 10/25: prone knee hang; 10/30: SLR with ER       Patient  will benefit from skilled therapeutic intervention in order to improve the following deficits and impairments:  Abnormal gait, Decreased activity tolerance, Decreased balance, Decreased endurance, Decreased mobility, Decreased range of motion, Decreased scar mobility, Decreased strength, Difficulty walking, Hypomobility, Increased edema, Increased fascial restricitons, Increased muscle spasms, Impaired flexibility, Improper body mechanics, Pain  Visit Diagnosis: Acute pain of right knee  Localized edema  Muscle weakness (generalized)  Difficulty in walking, not elsewhere classified     Problem List Patient Active Problem List   Diagnosis Date Noted  . OA (osteoarthritis) of knee 11/16/2017   CaIhor AustinLPTA; CBIS [id number] [phone] [id number] Lento0/30/2019, 3:29 PM  [place] [place]one: [phone] Fax:  [phone]: Jonathan DELANGERN: [id number] of Birth: 2/November 04, 1950

## 2018-05-07 ENCOUNTER — Ambulatory Visit (HOSPITAL_COMMUNITY): Payer: Medicare HMO | Attending: Orthopedic Surgery

## 2018-05-07 ENCOUNTER — Encounter (HOSPITAL_COMMUNITY): Payer: Self-pay

## 2018-05-07 DIAGNOSIS — M25561 Pain in right knee: Secondary | ICD-10-CM | POA: Diagnosis not present

## 2018-05-07 DIAGNOSIS — R262 Difficulty in walking, not elsewhere classified: Secondary | ICD-10-CM | POA: Diagnosis not present

## 2018-05-07 DIAGNOSIS — M6281 Muscle weakness (generalized): Secondary | ICD-10-CM

## 2018-05-07 DIAGNOSIS — R600 Localized edema: Secondary | ICD-10-CM

## 2018-05-07 NOTE — Therapy (Signed)
[place], [place], [address] Phone: [phone]   Fax:  [phone]  Physical Therapy Treatment  Patient Details  Name: Jonathan Ochoa MRN: [id number] Date of Birth: Jul 30, 1950 Referring Provider (PT): [name], MD   Encounter Date: 05/07/2018  PT End of Session - 05/07/18 1431    Visit Number  12    Number of Visits  19    Date for PT Re-Evaluation  05/21/18   MInireassess complete 04/30/18   Authorization Type  [employer] HMO    Authorization Time Period  04/09/18 to 05/21/18    Authorization - Visit Number  12    Authorization - Number of Visits  19    PT Start Time  1430    PT Stop Time  1509    PT Time Calculation (min)  39 min    Activity Tolerance  Patient tolerated treatment well;No increased pain    Behavior During Therapy  WFL for tasks assessed/performed       Past Medical History:  Diagnosis Date  . Arthritis   . Hyperthyroidism   . Hypothyroidism   . Thyroid disease     Past Surgical History:  Procedure Laterality Date  . adhesions of abdomen     from scar tissue from appendectomy  . AMPUTATION OF REPLICATED TOES     partial tip amputation of left big toe, and next toe   . APPENDECTOMY  1977  . arthroscpy      left knee -meniscus tear  . COLONOSCOPY    . STERIOD INJECTION Right 11/16/2017   Procedure: CORTISONE  INJECTION RIGHT KNEE;  Surgeon: [name], MD;  Location: [facility];  Service: Orthopedics;  Laterality: Right;  . TOE SURGERY    . TOTAL KNEE ARTHROPLASTY Left 11/16/2017   Procedure: LEFT TOTAL KNEE ARTHROPLASTY;  Surgeon: [name], MD;  Location: [facility];  Service: Orthopedics;  Laterality: Left;  . TOTAL KNEE ARTHROPLASTY Right 04/05/2018   Procedure: RIGHT TOTAL KNEE ARTHROPLASTY;  Surgeon: [name], MD;  Location: [facility];  Service: Orthopedics;  Laterality: Right;    There were no vitals filed for this visit.  Subjective Assessment - 05/07/18  1431    Subjective  Pt reports he's doing well. No pain.     Patient Stated Goals  get in as good of shape as his L knee    Currently in Pain?  No/denies           [facility] Adult PT Treatment/Exercise - 05/07/18 0001      Exercises   Exercises  Knee/Hip      Knee/Hip Exercises: Stretches   Active Hamstring Stretch  Right;3 reps;30 seconds    Active Hamstring Stretch Limitations  standing, 12" step, with overpressure    Gastroc Stretch  Both;3 reps;30 seconds    Gastroc Stretch Limitations  slant board      Knee/Hip Exercises: Machines for Strengthening   Cybex Knee Extension  BLE, 3plates, 2x10reps    Cybex Leg Press  leg press 6plates BLE 8X44 reps      Knee/Hip Exercises: Standing   Wall Squat  10 reps;5 seconds    Wall Squat Limitations  small pink ball between knees for VMO    Other Standing Knee Exercises  lat heel taps 4" step 2x15reps      Knee/Hip Exercises: Seated   Long Arc Quad  Right    Long Arc Quad Limitations  during [place] estim x60mns  Knee/Hip Exercises: Supine   Quad Sets  Right    Quad Sets Limitations  during [place] estim x3 mins    Short Arc [employer] Sets  Right    Short Arc Quad Sets Limitations  during [place] Estim x4 mins    Knee Extension Limitations  2    Knee Flexion Limitations  121      Knee/Hip Exercises: Prone   Other Prone Exercises  R TKE 10x10" holds 3# for strength and proprioception      Modalities   Modalities  Electrical engineer Stimulation Location  R VMO and rectus femoris bil co-contraction of bil channels    Electrical Stimulation Action  mm contraction    Electrical Stimulation Parameters  Russian x10 mins, 10/30 cycle time, 2 sec ramp    Electrical Stimulation Goals  Neuromuscular facilitation;Strength            PT Short Term Goals - 04/30/18 1434      PT SHORT TERM GOAL #1   Title  Pt will have improved R knee AROM from 10-100deg in order to decrease pain and  improve gait.    Baseline  10/25: 7-120    Time  3    Period  Weeks    Status  Achieved      PT SHORT TERM GOAL #2   Title  Pt will have decreased joint line edema by 3cm or > in order to decrease pain and maximize ROM.    Time  3    Period  Weeks    Status  Achieved      PT SHORT TERM GOAL #3   Title  Pt will have improved MMT to at least 4/5 MMT throughout all mm groups tested in order to maximize transfers, balance, and gait.    Time  3    Period  Weeks    Status  New      PT SHORT TERM GOAL #4   Title  Pt will be able to perform R SLS for 10 sec without UE support to demo improved functional strength and maximize gait.    Baseline  10/25: 5.5sec    Time  3    Period  Weeks    Status  On-going        PT Long Term Goals - 04/30/18 1435      PT LONG TERM GOAL #1   Title  Pt will have improved R knee AROM from 0-120deg or better in order to further decrease pain and maximize stair ambulation.    Baseline  10/25: 5-120    Time  6    Period  Weeks    Status  Revised      PT LONG TERM GOAL #2   Title  Pt will have improved MMT to at least 4+/5 or better throughout all mm groups tested in order to further maximize functional mobility and return to PLOF.    Time  6    Period  Weeks    Status  Partially Met      PT LONG TERM GOAL #3   Title  Pt will be able to perform R SLS for 30 sec or > without UE support to further maximize gait and stair ambulation.     Baseline  10/25: 5.5sec RLE    Time  6    Period  Weeks    Status  On-going      PT LONG TERM  GOAL #4   Title  Pt will be able to ambulate 667f during 3MWT with LRAD and gait WFL in order to demo return to PLOF and allow pt to return to his regular workout routine.    Baseline  10/25: 7733fno AD, gait slightly limited    Time  6    Period  Weeks    Status  Partially Met      PT LONG TERM GOAL #5   Title  Pt will be able to perform 5xSTS in 12 sec or < with proper form to demo improved functional strength.     Baseline  10/25: 8.3sec    Time  6    Period  Weeks    Status  Achieved            Plan - 05/07/18 1510    Clinical Impression Statement  Continued with established POC focusing on improving knee extension and quad strength. Resumed heel taps for quad strength and continued with [place] to improve quad contraction/strength. Pt noted to have much improved quad contraction prior to and following Ru[place]im; had him perform LAQ during [place] date. Added knee extension on multigym for further quad strength. AROM 2 to 121deg this date. Due to progress in extension this week, PT feels pt is safe to decrease his frequency to 2x/week for the remainder of his PT POC; pt agreeable to this plan. Continue as planned, progressing as able.     Rehab Potential  Good    PT Frequency  3x / week    PT Duration  6 weeks    PT Treatment/Interventions  ADLs/Self Care Home Management;Biofeedback;Cryotherapy;Electrical Stimulation;Moist Heat;Ultrasound;DME Instruction;Gait training;Stair training;Functional mobility training;Therapeutic activities;Therapeutic exercise;Balance training;Neuromuscular re-education;Patient/family education;Manual techniques;Scar mobilization;Passive range of motion;Dry needling;Energy conservation;Taping;Compression bandaging    PT Next Visit Plan  continue machines adding in HS curls; continue functional strengthening now that AROM has improved.  Conitnue manual as needed.  Hold [place] now as extension has improved    PT Home Exercise Plan  eval: heel slides, quad set; 10/9: supine HS stretch; 10/14: supine heel prop; 10/23: prone quad stretch, TKE; 10/25: prone knee hang; 10/30: SLR with ER    Consulted and Agree with Plan of Care  Patient       Patient will benefit from skilled therapeutic intervention in order to improve the following deficits and impairments:  Abnormal gait, Decreased activity tolerance, Decreased balance, Decreased endurance, Decreased mobility,  Decreased range of motion, Decreased scar mobility, Decreased strength, Difficulty walking, Hypomobility, Increased edema, Increased fascial restricitons, Increased muscle spasms, Impaired flexibility, Improper body mechanics, Pain  Visit Diagnosis: Acute pain of right knee  Localized edema  Muscle weakness (generalized)  Difficulty in walking, not elsewhere classified     Problem List Patient Active Problem List   Diagnosis Date Noted  . OA (osteoarthritis) of knee 11/16/2017        BrGeraldine SolarT, DPT   [address]hone: [phone] Fax:  [phone]: Jonathan SALMINENRN: [id number] of Birth: 08/10/03/1952

## 2018-05-10 ENCOUNTER — Encounter (HOSPITAL_COMMUNITY): Payer: Self-pay

## 2018-05-10 ENCOUNTER — Ambulatory Visit (HOSPITAL_COMMUNITY): Payer: Medicare HMO

## 2018-05-10 DIAGNOSIS — M25561 Pain in right knee: Secondary | ICD-10-CM

## 2018-05-10 DIAGNOSIS — R600 Localized edema: Secondary | ICD-10-CM

## 2018-05-10 DIAGNOSIS — M6281 Muscle weakness (generalized): Secondary | ICD-10-CM | POA: Diagnosis not present

## 2018-05-10 DIAGNOSIS — R262 Difficulty in walking, not elsewhere classified: Secondary | ICD-10-CM

## 2018-05-10 NOTE — Therapy (Signed)
[place], [place], [address] Phone: [phone]   Fax:  [phone]  Physical Therapy Treatment  Patient Details  Name: Jonathan Ochoa MRN: [id number] Date of Birth: [birth year] Referring Provider (PT): [name], MD   Encounter Date: 05/10/2018  PT End of Session - 05/10/18 1519    Visit Number  13    Number of Visits  19    Date for PT Re-Evaluation  05/21/18   MInireassess complete 04/30/18   Authorization Type  [employer] HMO    Authorization Time Period  04/09/18 to 05/21/18    Authorization - Visit Number  13    Authorization - Number of Visits  19    PT Start Time  9326    PT Stop Time  7124    PT Time Calculation (min)  41 min    Activity Tolerance  Patient tolerated treatment well;No increased pain    Behavior During Therapy  WFL for tasks assessed/performed       Past Medical History:  Diagnosis Date  . Arthritis   . Hyperthyroidism   . Hypothyroidism   . Thyroid disease     Past Surgical History:  Procedure Laterality Date  . adhesions of abdomen     from scar tissue from appendectomy  . AMPUTATION OF REPLICATED TOES     partial tip amputation of left big toe, and next toe   . APPENDECTOMY  1977  . arthroscpy      left knee -meniscus tear  . COLONOSCOPY    . STERIOD INJECTION Right 11/16/2017   Procedure: CORTISONE  INJECTION RIGHT KNEE;  Surgeon: [name], MD;  Location: [facility];  Service: Orthopedics;  Laterality: Right;  . TOE SURGERY    . TOTAL KNEE ARTHROPLASTY Left 11/16/2017   Procedure: LEFT TOTAL KNEE ARTHROPLASTY;  Surgeon: [name], MD;  Location: [facility];  Service: Orthopedics;  Laterality: Left;  . TOTAL KNEE ARTHROPLASTY Right 04/05/2018   Procedure: RIGHT TOTAL KNEE ARTHROPLASTY;  Surgeon: [name], MD;  Location: [facility];  Service: Orthopedics;  Laterality: Right;    There were no vitals filed for this visit.  Subjective Assessment - 05/10/18  1519    Subjective  Pt states he is doing well. He had a good weekend. No pain.    Patient Stated Goals  get in as good of shape as his L knee    Currently in Pain?  No/denies            [facility] Adult PT Treatment/Exercise - 05/10/18 0001      Exercises   Exercises  Knee/Hip      Knee/Hip Exercises: Stretches   Active Hamstring Stretch  Right;3 reps;30 seconds    Active Hamstring Stretch Limitations  standing, 12" step, with overpressure    Gastroc Stretch  Both;3 reps;30 seconds    Gastroc Stretch Limitations  slant board      Knee/Hip Exercises: Aerobic   Stationary Bike  x4 mins, seat 12, full revolutions, for mobility      Knee/Hip Exercises: Machines for Strengthening   Cybex Knee Extension  BLE, 3plates, 2x10reps    Cybex Knee Flexion  BLE 6 plates, 2x10reps    Cybex Leg Press  leg press 7plates BLE 5Y09 reps    Other Machine  fwd/retro resisted walking at multigym: 60# fwd and retro x5RT each      Knee/Hip Exercises: Standing   Heel Raises  Both;20 reps    Heel  Raises Limitations  heel and toe slope    Wall Squat  10 reps;5 seconds    Wall Squat Limitations  small pink ball between knees for VMO    SLS with Vectors  RLE x5RT    Gait Training  bil tandem stance foam 3x10"; bil tandem stance foam +UE flexion 3# weight bar x10 reps each    Other Standing Knee Exercises  lat heel taps 6" step 2x10reps    Other Standing Knee Exercises  sidestepping RTB 47fx 2RT      Knee/Hip Exercises: Seated   Stool Scoot - Round Trips  x2RT fwd/retro      Knee/Hip Exercises: Supine   Straight Leg Raises  Right;15 reps    Straight Leg Raises Limitations  4# with ER    Knee Extension Limitations  2    Knee Flexion Limitations  123      Knee/Hip Exercises: Prone   Other Prone Exercises  R TKE 10x5" holds 4# for strength and proprioception           PT Education - 05/10/18 1519    Education provided  Yes    Education Details  exercise technique, continue HEP    Person(s)  Educated  Patient    Methods  Explanation;Demonstration    Comprehension  Returned demonstration;Verbalized understanding       PT Short Term Goals - 04/30/18 1434      PT SHORT TERM GOAL #1   Title  Pt will have improved R knee AROM from 10-100deg in order to decrease pain and improve gait.    Baseline  10/25: 7-120    Time  3    Period  Weeks    Status  Achieved      PT SHORT TERM GOAL #2   Title  Pt will have decreased joint line edema by 3cm or > in order to decrease pain and maximize ROM.    Time  3    Period  Weeks    Status  Achieved      PT SHORT TERM GOAL #3   Title  Pt will have improved MMT to at least 4/5 MMT throughout all mm groups tested in order to maximize transfers, balance, and gait.    Time  3    Period  Weeks    Status  New      PT SHORT TERM GOAL #4   Title  Pt will be able to perform R SLS for 10 sec without UE support to demo improved functional strength and maximize gait.    Baseline  10/25: 5.5sec    Time  3    Period  Weeks    Status  On-going        PT Long Term Goals - 04/30/18 1435      PT LONG TERM GOAL #1   Title  Pt will have improved R knee AROM from 0-120deg or better in order to further decrease pain and maximize stair ambulation.    Baseline  10/25: 5-120    Time  6    Period  Weeks    Status  Revised      PT LONG TERM GOAL #2   Title  Pt will have improved MMT to at least 4+/5 or better throughout all mm groups tested in order to further maximize functional mobility and return to PLOF.    Time  6    Period  Weeks    Status  Partially Met  PT LONG TERM GOAL #3   Title  Pt will be able to perform R SLS for 30 sec or > without UE support to further maximize gait and stair ambulation.     Baseline  10/25: 5.5sec RLE    Time  6    Period  Weeks    Status  On-going      PT LONG TERM GOAL #4   Title  Pt will be able to ambulate 657f during 3MWT with LRAD and gait WFL in order to demo return to PLOF and allow pt to return  to his regular workout routine.    Baseline  10/25: 7779fno AD, gait slightly limited    Time  6    Period  Weeks    Status  Partially Met      PT LONG TERM GOAL #5   Title  Pt will be able to perform 5xSTS in 12 sec or < with proper form to demo improved functional strength.    Baseline  10/25: 8.3sec    Time  6    Period  Weeks    Status  Achieved            Plan - 05/10/18 1559    Clinical Impression Statement  Continued with established POC focusing on LE strength, especially his R quad. Added stool scoots and fwd/retro resisted walking at multigym and cybex knee flexion while also beginning dynamic balance work; pt challenged the most with vectors and can be progressed to paKelloggress with tandem stance foam and tandem gait. Cues to decrease hip ER/hip flexor compensation with sidestepping. Held manual this date to assess response without it. AROM 2 to 123deg this date; feel his last bit of lacking extension is due to quad strength deficits. Continue as planned, progressing as able.    Rehab Potential  Good    PT Frequency  3x / week    PT Duration  6 weeks    PT Treatment/Interventions  ADLs/Self Care Home Management;Biofeedback;Cryotherapy;Electrical Stimulation;Moist Heat;Ultrasound;DME Instruction;Gait training;Stair training;Functional mobility training;Therapeutic activities;Therapeutic exercise;Balance training;Neuromuscular re-education;Patient/family education;Manual techniques;Scar mobilization;Passive range of motion;Dry needling;Energy conservation;Taping;Compression bandaging    PT Next Visit Plan  continue machine strengthening; continue functional strengthening now that AROM has improved.  Conitnue manual as needed.  Hold [place] now as extension has improved    PT Home Exercise Plan  eval: heel slides, quad set; 10/9: supine HS stretch; 10/14: supine heel prop; 10/23: prone quad stretch, TKE; 10/25: prone knee hang; 10/30: SLR with ER    Consulted and Agree with  Plan of Care  Patient       Patient will benefit from skilled therapeutic intervention in order to improve the following deficits and impairments:  Abnormal gait, Decreased activity tolerance, Decreased balance, Decreased endurance, Decreased mobility, Decreased range of motion, Decreased scar mobility, Decreased strength, Difficulty walking, Hypomobility, Increased edema, Increased fascial restricitons, Increased muscle spasms, Impaired flexibility, Improper body mechanics, Pain  Visit Diagnosis: Acute pain of right knee  Localized edema  Muscle weakness (generalized)  Difficulty in walking, not elsewhere classified     Problem List Patient Active Problem List   Diagnosis Date Noted  . OA (osteoarthritis) of knee 11/16/2017        BrGeraldine SolarT, DPT  [address] [place]hone: [phone] Fax:  [phone]: ChFOYE DAMRONRN: [id number] of Birth: [birth year]

## 2018-05-12 ENCOUNTER — Encounter (HOSPITAL_COMMUNITY): Payer: Medicare HMO

## 2018-05-14 ENCOUNTER — Encounter (HOSPITAL_COMMUNITY): Payer: Self-pay

## 2018-05-14 ENCOUNTER — Ambulatory Visit (HOSPITAL_COMMUNITY): Payer: Medicare HMO

## 2018-05-14 DIAGNOSIS — M25561 Pain in right knee: Secondary | ICD-10-CM | POA: Diagnosis not present

## 2018-05-14 DIAGNOSIS — R262 Difficulty in walking, not elsewhere classified: Secondary | ICD-10-CM

## 2018-05-14 DIAGNOSIS — M6281 Muscle weakness (generalized): Secondary | ICD-10-CM | POA: Diagnosis not present

## 2018-05-14 DIAGNOSIS — R600 Localized edema: Secondary | ICD-10-CM

## 2018-05-14 NOTE — Therapy (Signed)
[place], [place], [address] Phone: [phone]   Fax:  [phone]  Physical Therapy Treatment  Patient Details  Name: Jonathan Ochoa MRN: [id number] Date of Birth: 19-Aug-1950 Referring Provider (PT): [name], MD   Encounter Date: 05/14/2018  PT End of Session - 05/14/18 1430    Visit Number  14    Number of Visits  19    Date for PT Re-Evaluation  05/21/18   MInireassess complete 04/30/18   Authorization Type  [employer] HMO    Authorization Time Period  04/09/18 to 05/21/18    Authorization - Visit Number  14    Authorization - Number of Visits  19    PT Start Time  5277    PT Stop Time  1510    PT Time Calculation (min)  42 min    Activity Tolerance  Patient tolerated treatment well;No increased pain    Behavior During Therapy  WFL for tasks assessed/performed       Past Medical History:  Diagnosis Date  . Arthritis   . Hyperthyroidism   . Hypothyroidism   . Thyroid disease     Past Surgical History:  Procedure Laterality Date  . adhesions of abdomen     from scar tissue from appendectomy  . AMPUTATION OF REPLICATED TOES     partial tip amputation of left big toe, and next toe   . APPENDECTOMY  1977  . arthroscpy      left knee -meniscus tear  . COLONOSCOPY    . STERIOD INJECTION Right 11/16/2017   Procedure: CORTISONE  INJECTION RIGHT KNEE;  Surgeon: [name], MD;  Location: [facility];  Service: Orthopedics;  Laterality: Right;  . TOE SURGERY    . TOTAL KNEE ARTHROPLASTY Left 11/16/2017   Procedure: LEFT TOTAL KNEE ARTHROPLASTY;  Surgeon: [name], MD;  Location: [facility];  Service: Orthopedics;  Laterality: Left;  . TOTAL KNEE ARTHROPLASTY Right 04/05/2018   Procedure: RIGHT TOTAL KNEE ARTHROPLASTY;  Surgeon: [name], MD;  Location: [facility];  Service: Orthopedics;  Laterality: Right;    There were no vitals filed for this visit.  Subjective Assessment - 05/14/18  1431    Subjective  Pt reports that he has a little soreness in his thigh after his last session but he's feeling good today.     Patient Stated Goals  get in as good of shape as his L knee    Currently in Pain?  No/denies         [facility] Adult PT Treatment/Exercise - 05/14/18 0001      Exercises   Exercises  Knee/Hip      Knee/Hip Exercises: Stretches   Active Hamstring Stretch  Right;3 reps;30 seconds    Active Hamstring Stretch Limitations  standing, 12" step, with overpressure    Gastroc Stretch  Both;3 reps;30 seconds    Gastroc Stretch Limitations  slant board      Knee/Hip Exercises: Machines for Strengthening   Cybex Knee Extension  BLE, 4plates, 2x10reps    Cybex Knee Flexion  BLE 7 plates, 2x10reps    Cybex Leg Press  leg press 7plates BLE 8E42 reps; RLE only 4plates x10 reps    Other Machine  fwd/retro resisted walking at multigym: 60# fwd and retro x5RT each      Knee/Hip Exercises: Standing   Forward Lunges  Both;10 reps    Forward Lunges Limitations  bosu, dome up    Lateral Step  Up  Right;10 reps    Lateral Step Up Limitations  bosu, dome up    Forward Step Up  Right;10 reps    Forward Step Up Limitations  bosu, dome up    SLS with Vectors  foam, RLE x10RT    Walking with Sports Cord  fwd tandem gait firm x2RT    Gait Training  bil tandem stance foam +paloff press RTB 2x10 reps each side    Other Standing Knee Exercises  sidestepping RTB 96fx 2RT      Knee/Hip Exercises: Supine   Bridges  Both;2 sets;15 reps    Straight Leg Raises  Right;15 reps    Straight Leg Raises Limitations  4# with ER      Knee/Hip Exercises: Sidelying   Hip ABduction  Right;15 reps      Knee/Hip Exercises: Prone   Hip Extension  Right;15 reps          PT Education - 05/14/18 1431    Education provided  Yes    Education Details  soreness after strengthening is normal; continue HEP    Person(s) Educated  Patient    Methods  Explanation;Demonstration    Comprehension   Verbalized understanding;Returned demonstration       PT Short Term Goals - 04/30/18 1434      PT SHORT TERM GOAL #1   Title  Pt will have improved R knee AROM from 10-100deg in order to decrease pain and improve gait.    Baseline  10/25: 7-120    Time  3    Period  Weeks    Status  Achieved      PT SHORT TERM GOAL #2   Title  Pt will have decreased joint line edema by 3cm or > in order to decrease pain and maximize ROM.    Time  3    Period  Weeks    Status  Achieved      PT SHORT TERM GOAL #3   Title  Pt will have improved MMT to at least 4/5 MMT throughout all mm groups tested in order to maximize transfers, balance, and gait.    Time  3    Period  Weeks    Status  New      PT SHORT TERM GOAL #4   Title  Pt will be able to perform R SLS for 10 sec without UE support to demo improved functional strength and maximize gait.    Baseline  10/25: 5.5sec    Time  3    Period  Weeks    Status  On-going        PT Long Term Goals - 04/30/18 1435      PT LONG TERM GOAL #1   Title  Pt will have improved R knee AROM from 0-120deg or better in order to further decrease pain and maximize stair ambulation.    Baseline  10/25: 5-120    Time  6    Period  Weeks    Status  Revised      PT LONG TERM GOAL #2   Title  Pt will have improved MMT to at least 4+/5 or better throughout all mm groups tested in order to further maximize functional mobility and return to PLOF.    Time  6    Period  Weeks    Status  Partially Met      PT LONG TERM GOAL #3   Title  Pt will be able to perform R SLS  for 30 sec or > without UE support to further maximize gait and stair ambulation.     Baseline  10/25: 5.5sec RLE    Time  6    Period  Weeks    Status  On-going      PT LONG TERM GOAL #4   Title  Pt will be able to ambulate 660f during 3MWT with LRAD and gait WFL in order to demo return to PLOF and allow pt to return to his regular workout routine.    Baseline  10/25: 7728fno AD, gait  slightly limited    Time  6    Period  Weeks    Status  Partially Met      PT LONG TERM GOAL #5   Title  Pt will be able to perform 5xSTS in 12 sec or < with proper form to demo improved functional strength.    Baseline  10/25: 8.3sec    Time  6    Period  Weeks    Status  Achieved            Plan - 05/14/18 1510    Clinical Impression Statement  Continued with established POC focusing on functional strength and dynamic balance. Continued with machine strengthening and sidestepping, and progressed pt to SLS vectors on foam as well as step ups and lunges on bosu. Progressed tandem stance on foam to include paloff press; pt challenged with this. He did very well with tandem gait firm and can be progressed to foam next session. Progressed his weight throughout almost all machines and had him perform single leg on leg press and knee extension for further R quad strengthening; challenged with single leg knee extension. AROM 2 to 123deg this date.     Rehab Potential  Good    PT Frequency  3x / week    PT Duration  6 weeks    PT Treatment/Interventions  ADLs/Self Care Home Management;Biofeedback;Cryotherapy;Electrical Stimulation;Moist Heat;Ultrasound;DME Instruction;Gait training;Stair training;Functional mobility training;Therapeutic activities;Therapeutic exercise;Balance training;Neuromuscular re-education;Patient/family education;Manual techniques;Scar mobilization;Passive range of motion;Dry needling;Energy conservation;Taping;Compression bandaging    PT Next Visit Plan  continue machine strengthening (functional BLE, machine, etc.), continue balance, udpate Aluisio wiht Monday's note, provide updated HEP    PT Home Exercise Plan  eval: heel slides, quad set; 10/9: supine HS stretch; 10/14: supine heel prop; 10/23: prone quad stretch, TKE; 10/25: prone knee hang; 10/30: SLR with ER    Consulted and Agree with Plan of Care  Patient       Patient will benefit from skilled therapeutic  intervention in order to improve the following deficits and impairments:  Abnormal gait, Decreased activity tolerance, Decreased balance, Decreased endurance, Decreased mobility, Decreased range of motion, Decreased scar mobility, Decreased strength, Difficulty walking, Hypomobility, Increased edema, Increased fascial restricitons, Increased muscle spasms, Impaired flexibility, Improper body mechanics, Pain  Visit Diagnosis: Acute pain of right knee  Localized edema  Muscle weakness (generalized)  Difficulty in walking, not elsewhere classified     Problem List Patient Active Problem List   Diagnosis Date Noted  . OA (osteoarthritis) of knee 11/16/2017       BrGeraldine SolarT, DPT  [place]one: [phone] Fax:  [phone]: Jonathan ARCHEYRN: [id number] of Birth: 2/February 17, 1951

## 2018-05-17 ENCOUNTER — Encounter (HOSPITAL_COMMUNITY): Payer: Self-pay

## 2018-05-17 ENCOUNTER — Ambulatory Visit (HOSPITAL_COMMUNITY): Payer: Medicare HMO

## 2018-05-17 DIAGNOSIS — M6281 Muscle weakness (generalized): Secondary | ICD-10-CM | POA: Diagnosis not present

## 2018-05-17 DIAGNOSIS — R262 Difficulty in walking, not elsewhere classified: Secondary | ICD-10-CM

## 2018-05-17 DIAGNOSIS — R600 Localized edema: Secondary | ICD-10-CM | POA: Diagnosis not present

## 2018-05-17 DIAGNOSIS — M25561 Pain in right knee: Secondary | ICD-10-CM | POA: Diagnosis not present

## 2018-05-17 NOTE — Patient Instructions (Signed)
Access Code: [email or web address]  URL: [email or web address]  Date: 05/17/2018  Prepared by: Geraldine Solar   Exercises Knee Extension with Weight Machine - 10 reps - 3 sets - 1x daily - 7x weekly Single Leg Knee Extension with Weight Machine - 10 reps - 3 sets                            - 1x daily - 7x weekly Eccentric Knee Extension with Weight Machine - 10 reps - 3 sets - 1x daily - 7x weekly Hamstring Curl with Weight Machine - 10 reps - 3 sets - 1x daily - 7x weekly Single Leg Hamstring Curl with Weight Machine - 10 reps - 3 sets                            - 1x daily - 7x weekly Eccentric Hamstring Curl with Weight Machine - 10 reps - 3 sets - 1x daily - 7x weekly Side Stepping with Resistance at Ankles - 10 reps - 3 sets - 1x daily - 7x weekly Sidelying Hip Abduction - 10 reps - 3 sets - 1x daily - 7x weekly Standing Hip Abduction with Resistance at Ankles - 10 reps - 3 sets - 1x daily - 7x weekly Mini Squat with Chair - 10 reps - 3 sets - 1x daily - 7x weekly Supine Bridge - 10 reps - 3 sets - 1x daily - 7x weekly Step Up - 10 reps - 3 sets - 1x daily - 7x weekly Lateral Step Ups - 10 reps - 3 sets - 1x daily - 7x weekly Single Leg Stance - 10 reps - 3 sets - 1x daily - 7x weekly Tandem Stance in Corner - 10 reps - 3 sets - 1x daily - 7x weekly Standing 3-Way Kick - 10 reps - 3 sets - 1x daily - 7x weekly

## 2018-05-17 NOTE — Therapy (Signed)
[place], [place], [address] Phone: [phone]   Fax:  [phone]  Physical Therapy Treatment  Patient Details  Name: Jonathan Ochoa MRN: [id number] Date of Birth: [birth year] Referring Provider (PT): [name], MD   Encounter Date: 05/17/2018  PT End of Session - 05/17/18 1429    Visit Number  15    Number of Visits  19    Date for PT Re-Evaluation  05/21/18   MInireassess complete 04/30/18   Authorization Type  [employer] Medicare HMO    Authorization Time Period  04/09/18 to 05/21/18    Authorization - Visit Number  15    Authorization - Number of Visits  19    PT Start Time  7371    PT Stop Time  1509    PT Time Calculation (min)  41 min    Activity Tolerance  Patient tolerated treatment well;No increased pain    Behavior During Therapy  WFL for tasks assessed/performed       Past Medical History:  Diagnosis Date  . Arthritis   . Hyperthyroidism   . Hypothyroidism   . Thyroid disease     Past Surgical History:  Procedure Laterality Date  . adhesions of abdomen     from scar tissue from appendectomy  . AMPUTATION OF REPLICATED TOES     partial tip amputation of left big toe, and next toe   . APPENDECTOMY  1977  . arthroscpy      left knee -meniscus tear  . COLONOSCOPY    . STERIOD INJECTION Right 11/16/2017   Procedure: CORTISONE  INJECTION RIGHT KNEE;  Surgeon: [name], MD;  Location: [facility];  Service: Orthopedics;  Laterality: Right;  . TOE SURGERY    . TOTAL KNEE ARTHROPLASTY Left 11/16/2017   Procedure: LEFT TOTAL KNEE ARTHROPLASTY;  Surgeon: [name], MD;  Location: [facility];  Service: Orthopedics;  Laterality: Left;  . TOTAL KNEE ARTHROPLASTY Right 04/05/2018   Procedure: RIGHT TOTAL KNEE ARTHROPLASTY;  Surgeon: [name], MD;  Location: [facility];  Service: Orthopedics;  Laterality: Right;    There were no vitals filed for this visit.  Subjective Assessment - 05/17/18  1430    Subjective  Pt states that the SLR with ER makes him the most sore. Otherwise, no pain, he feels good.     Patient Stated Goals  get in as good of shape as his L knee    Currently in Pain?  No/denies         [facility] Adult PT Treatment/Exercise - 05/17/18 0001      Exercises   Exercises  Knee/Hip      Knee/Hip Exercises: Stretches   Active Hamstring Stretch  Right;3 reps;30 seconds    Active Hamstring Stretch Limitations  standing, 12" step, with overpressure    Gastroc Stretch  Both;3 reps;30 seconds    Gastroc Stretch Limitations  slant board      Knee/Hip Exercises: Machines for Strengthening   Cybex Knee Extension  BLE, 4plates, 2x10reps (2nd set eccentric); RLE only 2 plates x10 reps    Cybex Knee Flexion  BLE 7 plates, 2x10reps (2nd set eccentric); RLE only 4plates x10 reps    Cybex Leg Press  leg press 7plates BLE 0G26 reps; RLE only 4plates x10 reps      Knee/Hip Exercises: Standing   Forward Lunges  Both;10 reps    Forward Lunges Limitations  bosu, dome up    Lateral Step Up  Right;10 reps    Lateral Step Up Limitations  bosu, dome up    Forward Step Up  Right;10 reps    Forward Step Up Limitations  bosu, dome up    SLS with Vectors  foam, RLE x10RT    Walking with Sports Cord  fwd tandem gait foam x2RT, retro tandem gait foam x1RT    Gait Training  bil tandem stance foam +paloff press RTB 2x10 reps each side      Knee/Hip Exercises: Seated   Stool Scoot - Round Trips  x3RT fwd/retro      Knee/Hip Exercises: Supine   Bridges  Both;2 sets;15 reps    Straight Leg Raises  Right;15 reps    Straight Leg Raises Limitations  5# with ER    Knee Extension Limitations  2    Knee Flexion Limitations  123      Knee/Hip Exercises: Sidelying   Hip ABduction  Right;2 sets;10 reps           PT Education - 05/17/18 1430    Education provided  Yes    Education Details  provided advanced HEP this date    Person(s) Educated  Patient    Methods   Explanation;Demonstration;Handout    Comprehension  Verbalized understanding;Returned demonstration       PT Short Term Goals - 04/30/18 1434      PT SHORT TERM GOAL #1   Title  Pt will have improved R knee AROM from 10-100deg in order to decrease pain and improve gait.    Baseline  10/25: 7-120    Time  3    Period  Weeks    Status  Achieved      PT SHORT TERM GOAL #2   Title  Pt will have decreased joint line edema by 3cm or > in order to decrease pain and maximize ROM.    Time  3    Period  Weeks    Status  Achieved      PT SHORT TERM GOAL #3   Title  Pt will have improved MMT to at least 4/5 MMT throughout all mm groups tested in order to maximize transfers, balance, and gait.    Time  3    Period  Weeks    Status  New      PT SHORT TERM GOAL #4   Title  Pt will be able to perform R SLS for 10 sec without UE support to demo improved functional strength and maximize gait.    Baseline  10/25: 5.5sec    Time  3    Period  Weeks    Status  On-going        PT Long Term Goals - 04/30/18 1435      PT LONG TERM GOAL #1   Title  Pt will have improved R knee AROM from 0-120deg or better in order to further decrease pain and maximize stair ambulation.    Baseline  10/25: 5-120    Time  6    Period  Weeks    Status  Revised      PT LONG TERM GOAL #2   Title  Pt will have improved MMT to at least 4+/5 or better throughout all mm groups tested in order to further maximize functional mobility and return to PLOF.    Time  6    Period  Weeks    Status  Partially Met      PT LONG TERM GOAL #3  Title  Pt will be able to perform R SLS for 30 sec or > without UE support to further maximize gait and stair ambulation.     Baseline  10/25: 5.5sec RLE    Time  6    Period  Weeks    Status  On-going      PT LONG TERM GOAL #4   Title  Pt will be able to ambulate 656f during 3MWT with LRAD and gait WFL in order to demo return to PLOF and allow pt to return to his regular workout  routine.    Baseline  10/25: 7761fno AD, gait slightly limited    Time  6    Period  Weeks    Status  Partially Met      PT LONG TERM GOAL #5   Title  Pt will be able to perform 5xSTS in 12 sec or < with proper form to demo improved functional strength.    Baseline  10/25: 8.3sec    Time  6    Period  Weeks    Status  Achieved            Plan - 05/17/18 1511    Clinical Impression Statement  Continued with established POC focusing on improving functional strength and balance. Progressed him to tandem gait on foam both forward and retro, he was challenged more with retro. Continued with machine strengthening and kept with BLE and RLE only while adding in eccentric strengthening. Pt tolerating all well, he just reported R quad fatigue. Continues to be challenged with SLS vectors. AROM maintaining at 2-123deg. Pt sees Dr. AlWynelle Linkomorrow so this note was sent to him for updated ROM #s. Pt due for reassessment and likely d/c at next visit due to progress made.     Rehab Potential  Good    PT Frequency  3x / week    PT Duration  6 weeks    PT Treatment/Interventions  ADLs/Self Care Home Management;Biofeedback;Cryotherapy;Electrical Stimulation;Moist Heat;Ultrasound;DME Instruction;Gait training;Stair training;Functional mobility training;Therapeutic activities;Therapeutic exercise;Balance training;Neuromuscular re-education;Patient/family education;Manual techniques;Scar mobilization;Passive range of motion;Dry needling;Energy conservation;Taping;Compression bandaging    PT Next Visit Plan  reassess and likely d/c (d/c HEP provided on 05/17/18 so no new additions need to be made)    PT Home Exercise Plan  eval: heel slides, quad set; 10/9: supine HS stretch; 10/14: supine heel prop; 10/23: prone quad stretch, TKE; 10/25: prone knee hang; 10/30: SLR with ER; 11/11: see below for extensive additions    Consulted and Agree with Plan of Care  Patient       Patient will benefit from skilled  therapeutic intervention in order to improve the following deficits and impairments:  Abnormal gait, Decreased activity tolerance, Decreased balance, Decreased endurance, Decreased mobility, Decreased range of motion, Decreased scar mobility, Decreased strength, Difficulty walking, Hypomobility, Increased edema, Increased fascial restricitons, Increased muscle spasms, Impaired flexibility, Improper body mechanics, Pain  Visit Diagnosis: Acute pain of right knee  Localized edema  Muscle weakness (generalized)  Difficulty in walking, not elsewhere classified     Problem List Patient Active Problem List   Diagnosis Date Noted  . OA (osteoarthritis) of knee 11/16/2017        BrGeraldine SolarT, DPT  [place]hone: [phone] Fax:  [phone]: Jonathan WEILRN: [id number] of Birth: 08/1950-03-13

## 2018-05-18 DIAGNOSIS — Z96651 Presence of right artificial knee joint: Secondary | ICD-10-CM | POA: Diagnosis not present

## 2018-05-18 DIAGNOSIS — Z471 Aftercare following joint replacement surgery: Secondary | ICD-10-CM | POA: Diagnosis not present

## 2018-05-19 ENCOUNTER — Encounter (HOSPITAL_COMMUNITY): Payer: Medicare HMO

## 2018-05-19 ENCOUNTER — Encounter (HOSPITAL_COMMUNITY): Payer: Self-pay

## 2018-05-19 NOTE — Therapy (Signed)
[place], [place], [address] Phone: [phone]   Fax:  [phone]  Patient Details  Name: Jonathan Ochoa MRN: [id number] Date of Birth: [birth year] Referring Provider:  No ref. provider found  Encounter Date: 05/19/2018    Pt came by the office and stated that his MD told him he did not need to continue therapy.    PHYSICAL THERAPY DISCHARGE SUMMARY  Visits from Start of Care: 15  Current functional level related to goals / functional outcomes: See last treatment note   Remaining deficits: See last treatment note   Education / Equipment: See last treatment note  Plan: Patient agrees to discharge.  Patient goals were partially met. Patient is being discharged due to being pleased with the current functional level.  ?????      Geraldine Solar PT, [place] [address] [place], [place], [address] Phone: [phone]   Fax:  [phone]

## 2018-05-21 ENCOUNTER — Encounter (HOSPITAL_COMMUNITY): Payer: Medicare HMO

## 2018-06-07 DIAGNOSIS — M9902 Segmental and somatic dysfunction of thoracic region: Secondary | ICD-10-CM | POA: Diagnosis not present

## 2018-06-07 DIAGNOSIS — M9905 Segmental and somatic dysfunction of pelvic region: Secondary | ICD-10-CM | POA: Diagnosis not present

## 2018-06-07 DIAGNOSIS — M5441 Lumbago with sciatica, right side: Secondary | ICD-10-CM | POA: Diagnosis not present

## 2018-06-07 DIAGNOSIS — M9903 Segmental and somatic dysfunction of lumbar region: Secondary | ICD-10-CM | POA: Diagnosis not present

## 2018-06-09 DIAGNOSIS — M9905 Segmental and somatic dysfunction of pelvic region: Secondary | ICD-10-CM | POA: Diagnosis not present

## 2018-06-09 DIAGNOSIS — M9902 Segmental and somatic dysfunction of thoracic region: Secondary | ICD-10-CM | POA: Diagnosis not present

## 2018-06-09 DIAGNOSIS — M9903 Segmental and somatic dysfunction of lumbar region: Secondary | ICD-10-CM | POA: Diagnosis not present

## 2018-06-09 DIAGNOSIS — M5441 Lumbago with sciatica, right side: Secondary | ICD-10-CM | POA: Diagnosis not present

## 2018-06-16 DIAGNOSIS — M9903 Segmental and somatic dysfunction of lumbar region: Secondary | ICD-10-CM | POA: Diagnosis not present

## 2018-06-16 DIAGNOSIS — M9905 Segmental and somatic dysfunction of pelvic region: Secondary | ICD-10-CM | POA: Diagnosis not present

## 2018-06-16 DIAGNOSIS — M5441 Lumbago with sciatica, right side: Secondary | ICD-10-CM | POA: Diagnosis not present

## 2018-06-16 DIAGNOSIS — M9902 Segmental and somatic dysfunction of thoracic region: Secondary | ICD-10-CM | POA: Diagnosis not present

## 2018-06-22 DIAGNOSIS — R5383 Other fatigue: Secondary | ICD-10-CM | POA: Diagnosis not present

## 2018-06-22 DIAGNOSIS — Z6831 Body mass index (BMI) 31.0-31.9, adult: Secondary | ICD-10-CM | POA: Diagnosis not present

## 2018-06-22 DIAGNOSIS — E039 Hypothyroidism, unspecified: Secondary | ICD-10-CM | POA: Diagnosis not present

## 2018-06-22 DIAGNOSIS — Z0001 Encounter for general adult medical examination with abnormal findings: Secondary | ICD-10-CM | POA: Diagnosis not present

## 2018-06-22 DIAGNOSIS — Z0000 Encounter for general adult medical examination without abnormal findings: Secondary | ICD-10-CM | POA: Diagnosis not present

## 2018-06-22 DIAGNOSIS — E6609 Other obesity due to excess calories: Secondary | ICD-10-CM | POA: Diagnosis not present

## 2018-06-22 DIAGNOSIS — Z1389 Encounter for screening for other disorder: Secondary | ICD-10-CM | POA: Diagnosis not present

## 2018-06-22 DIAGNOSIS — I10 Essential (primary) hypertension: Secondary | ICD-10-CM | POA: Diagnosis not present

## 2018-06-22 DIAGNOSIS — D649 Anemia, unspecified: Secondary | ICD-10-CM | POA: Diagnosis not present

## 2018-06-22 DIAGNOSIS — M1991 Primary osteoarthritis, unspecified site: Secondary | ICD-10-CM | POA: Diagnosis not present

## 2018-06-22 DIAGNOSIS — E063 Autoimmune thyroiditis: Secondary | ICD-10-CM | POA: Diagnosis not present

## 2018-06-22 DIAGNOSIS — R311 Benign essential microscopic hematuria: Secondary | ICD-10-CM | POA: Diagnosis not present

## 2018-06-26 DIAGNOSIS — Z1211 Encounter for screening for malignant neoplasm of colon: Secondary | ICD-10-CM | POA: Diagnosis not present

## 2018-08-02 ENCOUNTER — Encounter: Payer: Self-pay | Admitting: Physician Assistant

## 2018-08-10 ENCOUNTER — Encounter: Payer: Self-pay | Admitting: Physician Assistant

## 2018-08-10 ENCOUNTER — Ambulatory Visit: Payer: Medicare HMO | Admitting: Physician Assistant

## 2018-08-10 ENCOUNTER — Other Ambulatory Visit (INDEPENDENT_AMBULATORY_CARE_PROVIDER_SITE_OTHER): Payer: Medicare HMO

## 2018-08-10 VITALS — BP 112/74 | HR 72 | Ht 70.0 in | Wt 224.0 lb

## 2018-08-10 DIAGNOSIS — D649 Anemia, unspecified: Secondary | ICD-10-CM

## 2018-08-10 LAB — CBC WITH DIFFERENTIAL/PLATELET
BASOS ABS: 0.1 10*3/uL (ref 0.0–0.1)
BASOS PCT: 1 % (ref 0.0–3.0)
Eosinophils Absolute: 0.3 10*3/uL (ref 0.0–0.7)
Eosinophils Relative: 5.1 % — ABNORMAL HIGH (ref 0.0–5.0)
HEMATOCRIT: 39.8 % (ref 39.0–52.0)
Hemoglobin: 13.4 g/dL (ref 13.0–17.0)
LYMPHS ABS: 1.7 10*3/uL (ref 0.7–4.0)
LYMPHS PCT: 27.3 % (ref 12.0–46.0)
MCHC: 33.6 g/dL (ref 30.0–36.0)
MCV: 89.4 fl (ref 78.0–100.0)
MONOS PCT: 9.9 % (ref 3.0–12.0)
Monocytes Absolute: 0.6 10*3/uL (ref 0.1–1.0)
NEUTROS ABS: 3.6 10*3/uL (ref 1.4–7.7)
Neutrophils Relative %: 56.7 % (ref 43.0–77.0)
PLATELETS: 249 10*3/uL (ref 150.0–400.0)
RBC: 4.46 Mil/uL (ref 4.22–5.81)
RDW: 13.7 % (ref 11.5–15.5)
WBC: 6.3 10*3/uL (ref 4.0–10.5)

## 2018-08-10 LAB — COMPREHENSIVE METABOLIC PANEL
ALT: 20 U/L (ref 0–53)
AST: 23 U/L (ref 0–37)
Albumin: 4.1 g/dL (ref 3.5–5.2)
Alkaline Phosphatase: 107 U/L (ref 39–117)
BUN: 21 mg/dL (ref 6–23)
CALCIUM: 9.4 mg/dL (ref 8.4–10.5)
CO2: 27 meq/L (ref 19–32)
Chloride: 106 mEq/L (ref 96–112)
Creatinine, Ser: 0.75 mg/dL (ref 0.40–1.50)
GFR: 103.58 mL/min (ref >60.00)
GLUCOSE: 100 mg/dL — AB (ref 70–99)
POTASSIUM: 4.8 meq/L (ref 3.5–5.1)
Sodium: 139 mEq/L (ref 135–145)
Total Bilirubin: 0.5 mg/dL (ref 0.2–1.2)
Total Protein: 6.9 g/dL (ref 6.0–8.3)

## 2018-08-10 LAB — IBC PANEL
IRON: 99 ug/dL (ref 42–165)
SATURATION RATIOS: 23.5 % (ref 20.0–50.0)
TRANSFERRIN: 301 mg/dL (ref 212.0–360.0)

## 2018-08-10 LAB — FERRITIN: Ferritin: 44.1 ng/mL (ref 22.0–322.0)

## 2018-08-10 NOTE — Progress Notes (Addendum)
Chief Complaint: Iron deficiency anemia  HPI:    [name] is a 68 year old Caucasian male with a past medical history as listed below, who was referred to me by Redmond School, MD for a complaint of iron deficiency anemia.      04/06/2018 hemoglobin 11.1 (previously 11.2 11/18/2017).  MCV normal at that time.    Today, the patient presents to clinic and explains that back in December he had his annual office visit with PCP and at that time was diagnosed with anemia.  Explains that he was started on iron once daily.  He has been taking this since then.  Overall denies any GI symptoms.  Denies seeing any bright red blood in his stools or black tarry stools.  Patient is wondering "where is this anemia coming from".    Patient reports last colonoscopy in 2007 which was normal per him.  This was apparently done at [facility].    Denies fever, chills, weight loss, anorexia, abdominal pain, nausea, vomiting, heartburn, reflux or symptoms that awaken him from sleep.  Past Medical History:  Diagnosis Date  . Arthritis   . Hyperthyroidism   . Hypothyroidism   . Thyroid disease     Past Surgical History:  Procedure Laterality Date  . adhesions of abdomen     from scar tissue from appendectomy  . AMPUTATION OF REPLICATED TOES     partial tip amputation of left big toe, and next toe   . APPENDECTOMY  1977  . arthroscpy      left knee -meniscus tear  . COLONOSCOPY    . STERIOD INJECTION Right 11/16/2017   Procedure: CORTISONE  INJECTION RIGHT KNEE;  Surgeon: [name], MD;  Location: [facility];  Service: Orthopedics;  Laterality: Right;  . TOE SURGERY    . TOTAL KNEE ARTHROPLASTY Left 11/16/2017   Procedure: LEFT TOTAL KNEE ARTHROPLASTY;  Surgeon: [name], MD;  Location: [facility];  Service: Orthopedics;  Laterality: Left;  . TOTAL KNEE ARTHROPLASTY Right 04/05/2018   Procedure: RIGHT TOTAL KNEE ARTHROPLASTY;  Surgeon: [name], MD;  Location: [facility];  Service: Orthopedics;   Laterality: Right;    Current Outpatient Medications  Medication Sig Dispense Refill  . acetaminophen (TYLENOL) 500 MG tablet Take 1,000 mg by mouth every 6 (six) hours as needed (for pain.).    Marland Kitchen ferrous sulfate 325 (65 FE) MG tablet Take 325 mg by mouth daily with breakfast.    . FIBER ADULT GUMMIES PO Take 2 tablets by mouth daily at 12 noon.    Marland Kitchen levothyroxine (SYNTHROID, LEVOTHROID) 137 MCG tablet Take 137 mcg by mouth daily before breakfast.    . Multiple Vitamins-Minerals (MULTIVITAMIN WITH MINERALS) tablet Take 1 tablet by mouth daily at 12 noon.     . Omega-3 Fatty Acids (FISH OIL) 1000 MG CAPS Take 2,000 mg by mouth.    . Turmeric 500 MG CAPS Take 500 mg by mouth daily at 12 noon.    . Saw Palmetto 450 MG CAPS Take 1,350 mg by mouth daily at 12 noon.     No current facility-administered medications for this visit.     Allergies as of 08/10/2018  . (No Known Allergies)    Family History  Problem Relation Age of Onset  . Stroke Mother   . Lung cancer Father   . Colon cancer Neg Hx     Social History   Socioeconomic History  . Marital status: Married    Spouse name: Not on file  .  Number of children: Not on file  . Years of education: Not on file  . Highest education level: Not on file  Occupational History  . Not on file  Social Needs  . Financial resource strain: Not on file  . Food insecurity:    Worry: Not on file    Inability: Not on file  . Transportation needs:    Medical: Not on file    Non-medical: Not on file  Tobacco Use  . Smoking status: Never Smoker  . Smokeless tobacco: Never Used  Substance and Sexual Activity  . Alcohol use: Not Currently  . Drug use: No  . Sexual activity: Not on file  Lifestyle  . Physical activity:    Days per week: Not on file    Minutes per session: Not on file  . Stress: Not on file  Relationships  . Social connections:    Talks on phone: Not on file    Gets together: Not on file    Attends religious service:  Not on file    Active member of club or organization: Not on file    Attends meetings of clubs or organizations: Not on file    Relationship status: Not on file  . Intimate partner violence:    Fear of current or ex partner: Not on file    Emotionally abused: Not on file    Physically abused: Not on file    Forced sexual activity: Not on file  Other Topics Concern  . Not on file  Social History Narrative  . Not on file    Review of Systems:    Constitutional: No weight loss, fever or chills Skin: No rash  Cardiovascular: No chest pain Respiratory: No SOB  Gastrointestinal: See HPI and otherwise negative Genitourinary: No dysuria  Neurological: No headache, dizziness or syncope Musculoskeletal: No new muscle or joint pain Hematologic: No bleeding or bruising Psychiatric: No history of depression or anxiety   Physical Exam:  Vital signs: BP 112/74   Pulse 72   Ht 5\' 10"  (1.778 m)   Wt 224 lb (101.6 kg)   BMI 32.14 kg/m   Constitutional:   Pleasant Caucasian male appears to be in NAD, Well developed, Well nourished, alert and cooperative Head:  Normocephalic and atraumatic. Eyes:   PEERL, EOMI. No icterus. Conjunctiva pink. Ears:  Normal auditory acuity. Neck:  Supple Throat: Oral cavity and pharynx without inflammation, swelling or lesion.  Respiratory: Respirations even and unlabored. Lungs clear to auscultation bilaterally.   No wheezes, crackles, or rhonchi.  Cardiovascular: Normal S1, S2. No MRG. Regular rate and rhythm. No peripheral edema, cyanosis or pallor.  Gastrointestinal:  Soft, nondistended, nontender. No rebound or guarding. Normal bowel sounds. No appreciable masses or hepatomegaly. Rectal:  Not performed.  Msk:  Symmetrical without gross deformities. Without edema, no deformity or joint abnormality.  Neurologic:  Alert and  oriented x4;  grossly normal neurologically.  Skin:   Dry and intact without significant lesions or rashes. Psychiatric: Demonstrates  good judgement and reason without abnormal affect or behaviors.  MOST RECENT LABS AND IMAGING: CBC    Component Value Date/Time   WBC 9.3 04/06/2018 0532   RBC 3.68 (L) 04/06/2018 0532   HGB 11.1 (L) 04/06/2018 0532   HCT 33.8 (L) 04/06/2018 0532   PLT 218 04/06/2018 0532   MCV 91.8 04/06/2018 0532   MCH 30.2 04/06/2018 0532   MCHC 32.8 04/06/2018 0532   RDW 13.1 04/06/2018 0532    CMP  Component Value Date/Time   NA 137 04/06/2018 0532   K 4.6 04/06/2018 0532   CL 107 04/06/2018 0532   CO2 27 04/06/2018 0532   GLUCOSE 135 (H) 04/06/2018 0532   BUN 12 04/06/2018 0532   CREATININE 0.82 04/06/2018 0532   CALCIUM 8.5 (L) 04/06/2018 0532   PROT 7.5 03/29/2018 1417   ALBUMIN 3.8 03/29/2018 1417   AST 28 03/29/2018 1417   ALT 20 03/29/2018 1417   ALKPHOS 93 03/29/2018 1417   BILITOT 0.7 03/29/2018 1417   GFRNONAA >60 04/06/2018 0532   GFRAA >60 04/06/2018 0532    Assessment: 1.  Anemia: Unspecified, last labs in October of last year with a hemoglobin of 11.1, patient does report 2 separate knee surgeries 1 in May and 1 in September of last year, has been taking iron daily since then; consider GI source of blood loss versus other  Plan: 1.  Patient is overdue for screening colonoscopy and with anemia noted on his last 3 labs last year, will proceed with an EGD at the same time.  Patient requests Dr. Carlean Purl as this is who his wife sees.  Did discuss risk of benefits, limitations and alternatives and the patient agrees to proceed. 2.  Patient to continue iron once daily for now. 3.  We will recheck CBC, CMP and iron studies including Ferritin today. 4.  Patient to follow in clinic per recommendations after studies above.  Ellouise Newer, PA-C [facility] 08/10/2018, 9:05 AM  Cc: Redmond School, MD   Agree with Ms. Akacia Boltz's evaluation and management.  Gatha Mayer, MD, [facility]

## 2018-08-10 NOTE — Patient Instructions (Signed)
Continue your iron daily.  Your provider has requested that you go to the basement level for lab work before leaving today. Press "B" on the elevator. The lab is located at the first door on the left as you exit the elevator.  You have been scheduled for an endoscopy and colonoscopy. Please follow the written instructions given to you at your visit today. Please pick up your prep supplies at the pharmacy within the next 1-3 days. If you use inhalers (even only as needed), please bring them with you on the day of your procedure. Your physician has requested that you go to www.startemmi.com and enter the access code given to you at your visit today. This web site gives a general overview about your procedure. However, you should still follow specific instructions given to you by our office regarding your preparation for the procedure.

## 2018-08-26 ENCOUNTER — Encounter: Payer: Self-pay | Admitting: Internal Medicine

## 2018-09-07 DIAGNOSIS — E890 Postprocedural hypothyroidism: Secondary | ICD-10-CM | POA: Diagnosis not present

## 2018-09-09 ENCOUNTER — Ambulatory Visit (AMBULATORY_SURGERY_CENTER): Payer: Medicare HMO | Admitting: Internal Medicine

## 2018-09-09 ENCOUNTER — Encounter: Payer: Self-pay | Admitting: Internal Medicine

## 2018-09-09 ENCOUNTER — Other Ambulatory Visit: Payer: Self-pay

## 2018-09-09 VITALS — BP 113/61 | HR 49 | Temp 97.3°F | Resp 11 | Ht 70.0 in | Wt 224.0 lb

## 2018-09-09 DIAGNOSIS — K5730 Diverticulosis of large intestine without perforation or abscess without bleeding: Secondary | ICD-10-CM | POA: Diagnosis not present

## 2018-09-09 DIAGNOSIS — K3189 Other diseases of stomach and duodenum: Secondary | ICD-10-CM

## 2018-09-09 DIAGNOSIS — E039 Hypothyroidism, unspecified: Secondary | ICD-10-CM | POA: Diagnosis not present

## 2018-09-09 DIAGNOSIS — D124 Benign neoplasm of descending colon: Secondary | ICD-10-CM

## 2018-09-09 DIAGNOSIS — K259 Gastric ulcer, unspecified as acute or chronic, without hemorrhage or perforation: Secondary | ICD-10-CM

## 2018-09-09 DIAGNOSIS — D649 Anemia, unspecified: Secondary | ICD-10-CM | POA: Diagnosis not present

## 2018-09-09 DIAGNOSIS — D508 Other iron deficiency anemias: Secondary | ICD-10-CM

## 2018-09-09 MED ORDER — SODIUM CHLORIDE 0.9 % IV SOLN: 500.0000 mL | Freq: Once | INTRAVENOUS | Status: DC

## 2018-09-09 NOTE — Progress Notes (Signed)
Called to room to assist during endoscopic procedure.  Patient ID and intended procedure confirmed with present staff. Received instructions for my participation in the procedure from the performing physician.  

## 2018-09-09 NOTE — Op Note (Signed)
[place] Patient Name: Jonathan Ochoa Procedure Date: 09/09/2018 11:04 AM MRN: [id number] Endoscopist: Gatha Mayer , MD Age: 68 Referring MD:  Date of Birth: [birth year] Gender: Male Account #: [id number] Procedure:                Upper GI endoscopy Indications:              Iron deficiency anemia Medicines:                Propofol per Anesthesia, Monitored Anesthesia Care Procedure:                Pre-Anesthesia Assessment:                           - Prior to the procedure, a History and Physical                            was performed, and patient medications and                            allergies were reviewed. The patient's tolerance of                            previous anesthesia was also reviewed. The risks                            and benefits of the procedure and the sedation                            options and risks were discussed with the patient.                            All questions were answered, and informed consent                            was obtained. Prior Anticoagulants: The patient has                            taken no previous anticoagulant or antiplatelet                            agents. ASA Grade Assessment: II - A patient with                            mild systemic disease. After reviewing the risks                            and benefits, the patient was deemed in                            satisfactory condition to undergo the procedure.                           After obtaining informed consent, the endoscope was  passed under direct vision. Throughout the                            procedure, the patient's blood pressure, pulse, and                            oxygen saturations were monitored continuously. The                            Endoscope was introduced through the mouth, and                            advanced to the second part of duodenum. The upper                            GI  endoscopy was accomplished without difficulty.                            The patient tolerated the procedure well. Scope In: Scope Out: Findings:                 Multiple dispersed, small non-bleeding erosions                            were found in the prepyloric region of the stomach.                            There were stigmata of recent bleeding. Biopsies                            were taken with a cold forceps for histology.                            Estimated blood loss was minimal.                           The exam was otherwise without abnormality except                            slight erythjema at GE junction.                           The cardia and gastric fundus were normal on                            retroflexion. Complications:            No immediate complications. Estimated Blood Loss:     Estimated blood loss was minimal. Impression:               - Non-bleeding erosive gastropathy. Biopsied. takes                            81 mg ASA qd - could cause, ? H pylori also                           -  The examination was otherwise normal except                            slight erythema at GE junction. Recommendation:           - Patient has a contact number available for                            emergencies. The signs and symptoms of potential                            delayed complications were discussed with the                            patient. Return to normal activities tomorrow.                            Written discharge instructions were provided to the                            patient.                           - Continue present medications.                           - Await pathology results.                           - See the other procedure note for documentation of                            additional recommendations. I think anemia likely                            came from blood loss at knee surgery. He is                             rsponding to iron, Gatha Mayer, MD 09/09/2018 11:55:27 AM This report has been signed electronically.

## 2018-09-09 NOTE — Progress Notes (Signed)
Patient's wife dropped his dentures while giving them back to him. States that "they are fine."

## 2018-09-09 NOTE — Patient Instructions (Addendum)
The stomach had some tiny ulcers - we call this erosive gastritis. Aspirin can do this and so can an infection and possibly other mediocations.   There was a tiny colon polyp (removed) and some diverticulosis in the colon..  Though you likely lose some blood (tiny amounts) from the gastritis I bet the anemia was a result of the blood loss with knee surgery.  I took biopsies and will let you know plans.  I appreciate the opportunity to care for you. Gatha Mayer, MD, FACG   YOU HAD AN ENDOSCOPIC PROCEDURE TODAY AT [facility]:   Refer to the procedure report that was given to you for any specific questions about what was found during the examination.  If the procedure report does not answer your questions, please call your gastroenterologist to clarify.  If you requested that your care partner not be given the details of your procedure findings, then the procedure report has been included in a sealed envelope for you to review at your convenience later.  YOU SHOULD EXPECT: Some feelings of bloating in the abdomen. Passage of more gas than usual.  Walking can help get rid of the air that was put into your GI tract during the procedure and reduce the bloating. If you had a lower endoscopy (such as a colonoscopy or flexible sigmoidoscopy) you may notice spotting of blood in your stool or on the toilet paper. If you underwent a bowel prep for your procedure, you may not have a normal bowel movement for a few days.  Please Note:  You might notice some irritation and congestion in your nose or some drainage.  This is from the oxygen used during your procedure.  There is no need for concern and it should clear up in a day or so.  SYMPTOMS TO REPORT IMMEDIATELY:   Following lower endoscopy (colonoscopy or flexible sigmoidoscopy):  Excessive amounts of blood in the stool  Significant tenderness or worsening of abdominal pains  Swelling of the abdomen that is new,  acute  Fever of 100F or higher   Following upper endoscopy (EGD)  Vomiting of blood or coffee ground material  New chest pain or pain under the shoulder blades  Painful or persistently difficult swallowing  New shortness of breath  Fever of 100F or higher  Black, tarry-looking stools  For urgent or emergent issues, a gastroenterologist can be reached at any hour by calling [phone].   DIET:  We do recommend a small meal at first, but then you may proceed to your regular diet.  Drink plenty of fluids but you should avoid alcoholic beverages for 24 hours.  ACTIVITY:  You should plan to take it easy for the rest of today and you should NOT DRIVE or use heavy machinery until tomorrow (because of the sedation medicines used during the test).    FOLLOW UP: Our staff will call the number listed on your records the next business day following your procedure to check on you and address any questions or concerns that you may have regarding the information given to you following your procedure. If we do not reach you, we will leave a message.  However, if you are feeling well and you are not experiencing any problems, there is no need to return our call.  We will assume that you have returned to your regular daily activities without incident.  If any biopsies were taken you will be contacted by phone or by letter within the  next 1-3 weeks.  Please call us at [phone] if you have not heard about the biopsies in 3 weeks.    SIGNATURES/CONFIDENTIALITY: You and/or your care partner have signed paperwork which will be entered into your electronic medical record.  These signatures attest to the fact that that the information above on your After Visit Summary has been reviewed and is understood.  Full responsibility of the confidentiality of this discharge information lies with you and/or your care-partner.  Read all handouts given to you by your recovery room nurse.     Dr. Carlean Purl will have  a plan after the biopsies come back.

## 2018-09-09 NOTE — Op Note (Signed)
[place] Patient Name: Jonathan Ochoa Procedure Date: 09/09/2018 11:03 AM MRN: [id number] Endoscopist: Gatha Mayer , MD Age: 68 Referring MD:  Date of Birth: 26-Jan-1951 Gender: Male Account #: [id number] Procedure:                Colonoscopy Indications:              Iron deficiency anemia Medicines:                Propofol per Anesthesia, Monitored Anesthesia Care Procedure:                Pre-Anesthesia Assessment:                           - Prior to the procedure, a History and Physical                            was performed, and patient medications and                            allergies were reviewed. The patient's tolerance of                            previous anesthesia was also reviewed. The risks                            and benefits of the procedure and the sedation                            options and risks were discussed with the patient.                            All questions were answered, and informed consent                            was obtained. Prior Anticoagulants: The patient has                            taken no previous anticoagulant or antiplatelet                            agents. ASA Grade Assessment: II - A patient with                            mild systemic disease. After reviewing the risks                            and benefits, the patient was deemed in                            satisfactory condition to undergo the procedure.                           After obtaining informed consent, the colonoscope  was passed under direct vision. Throughout the                            procedure, the patient's blood pressure, pulse, and                            oxygen saturations were monitored continuously. The                            Model CF-HQ190L [id number]) scope was introduced                            through the anus and advanced to the the cecum,                            identified by  appendiceal orifice and ileocecal                            valve. The colonoscopy was performed without                            difficulty. The patient tolerated the procedure                            well. The quality of the bowel preparation was                            good. The ileocecal valve, appendiceal orifice, and                            rectum were photographed. The bowel preparation                            used was Miralax. Scope In: 11:23:49 AM Scope Out: 11:41:48 AM Scope Withdrawal Time: 0 hours 13 minutes 11 seconds  Total Procedure Duration: 0 hours 17 minutes 59 seconds  Findings:                 The perianal and digital rectal examinations were                            normal. Pertinent negatives include normal prostate                            (size, shape, and consistency).                           A diminutive polyp was found in the descending                            colon. The polyp was sessile. The polyp was removed                            with a cold snare. Resection and retrieval were  complete. Verification of patient identification                            for the specimen was done. Estimated blood loss was                            minimal.                           A few diverticula were found in the sigmoid colon.                           The exam was otherwise without abnormality on                            direct and retroflexion views. Complications:            No immediate complications. Estimated Blood Loss:     Estimated blood loss was minimal. Impression:               - One diminutive polyp in the descending colon,                            removed with a cold snare. Resected and retrieved.                           - Diverticulosis in the sigmoid colon.                           - The examination was otherwise normal on direct                            and retroflexion views. Suspect anemia  was a result                            of blood loss from knee surgery. Recommendation:           - Patient has a contact number available for                            emergencies. The signs and symptoms of potential                            delayed complications were discussed with the                            patient. Return to normal activities tomorrow.                            Written discharge instructions were provided to the                            patient.                           - Resume previous diet.                           -  Continue present medications.                           - Await pathology results.                           - Repeat colonoscopy is recommended. The                            colonoscopy date will be determined after pathology                            results from today's exam become available for                            review.                           - Await EGD bx results also (gastritis) Gatha Mayer, MD 09/09/2018 11:58:00 AM This report has been signed electronically.

## 2018-09-09 NOTE — Progress Notes (Signed)
Report given to PACU, vss 

## 2018-09-10 ENCOUNTER — Telehealth: Payer: Self-pay | Admitting: *Deleted

## 2018-09-10 NOTE — Telephone Encounter (Signed)
  Follow up Call-  Call back number 09/09/2018  Post procedure Call Back phone  # [phone]  Permission to leave phone message Yes  Some recent data might be hidden     Patient questions:  Do you have a fever, pain , or abdominal swelling? No. Pain Score  0 *  Have you tolerated food without any problems? Yes.    Have you been able to return to your normal activities? Yes.    Do you have any questions about your discharge instructions: Diet   No. Medications  No. Follow up visit  No.  Do you have questions or concerns about your Care? No.  Actions: * If pain score is 4 or above: No action needed, pain <4.

## 2018-09-10 NOTE — Telephone Encounter (Signed)
  Follow up Call-  Call back number 09/09/2018  Post procedure Call Back phone  # [phone]  Permission to leave phone message Yes  Some recent data might be hidden     Patient questions:  Message left to call us if necessary.

## 2018-09-10 NOTE — Telephone Encounter (Signed)
Pt called to report that he is doing fine and "thanks for checking on me."

## 2018-09-14 ENCOUNTER — Encounter: Payer: Self-pay | Admitting: Internal Medicine

## 2018-09-14 DIAGNOSIS — Z86010 Personal history of colonic polyps: Secondary | ICD-10-CM | POA: Insufficient documentation

## 2018-09-14 DIAGNOSIS — Z860101 Personal history of adenomatous and serrated colon polyps: Secondary | ICD-10-CM

## 2018-09-14 DIAGNOSIS — E890 Postprocedural hypothyroidism: Secondary | ICD-10-CM | POA: Diagnosis not present

## 2018-09-14 HISTORY — DX: Personal history of adenomatous and serrated colon polyps: Z86.0101

## 2018-09-14 HISTORY — DX: Personal history of colonic polyps: Z86.010

## 2018-09-14 NOTE — Progress Notes (Signed)
Gastritis no H pylori no EGD recall - to take PPI if stays on ASA  Diminutive colon adenoma recall 2027  My Chart letter

## 2018-12-13 DIAGNOSIS — D225 Melanocytic nevi of trunk: Secondary | ICD-10-CM | POA: Diagnosis not present

## 2018-12-13 DIAGNOSIS — Z1283 Encounter for screening for malignant neoplasm of skin: Secondary | ICD-10-CM | POA: Diagnosis not present

## 2018-12-13 DIAGNOSIS — L570 Actinic keratosis: Secondary | ICD-10-CM | POA: Diagnosis not present

## 2018-12-13 DIAGNOSIS — X32XXXD Exposure to sunlight, subsequent encounter: Secondary | ICD-10-CM | POA: Diagnosis not present

## 2019-03-24 DIAGNOSIS — Z96651 Presence of right artificial knee joint: Secondary | ICD-10-CM | POA: Diagnosis not present

## 2019-03-24 DIAGNOSIS — Z471 Aftercare following joint replacement surgery: Secondary | ICD-10-CM | POA: Diagnosis not present

## 2019-04-11 DIAGNOSIS — Z23 Encounter for immunization: Secondary | ICD-10-CM | POA: Diagnosis not present

## 2019-09-07 DIAGNOSIS — E890 Postprocedural hypothyroidism: Secondary | ICD-10-CM | POA: Diagnosis not present

## 2019-09-14 DIAGNOSIS — E890 Postprocedural hypothyroidism: Secondary | ICD-10-CM | POA: Diagnosis not present

## 2019-12-28 DIAGNOSIS — M25511 Pain in right shoulder: Secondary | ICD-10-CM | POA: Diagnosis not present

## 2019-12-28 DIAGNOSIS — M19011 Primary osteoarthritis, right shoulder: Secondary | ICD-10-CM | POA: Diagnosis not present

## 2020-02-06 DIAGNOSIS — L570 Actinic keratosis: Secondary | ICD-10-CM | POA: Diagnosis not present

## 2020-02-06 DIAGNOSIS — X32XXXD Exposure to sunlight, subsequent encounter: Secondary | ICD-10-CM | POA: Diagnosis not present

## 2020-02-06 DIAGNOSIS — Z1283 Encounter for screening for malignant neoplasm of skin: Secondary | ICD-10-CM | POA: Diagnosis not present

## 2020-02-06 DIAGNOSIS — D225 Melanocytic nevi of trunk: Secondary | ICD-10-CM | POA: Diagnosis not present

## 2020-02-14 DIAGNOSIS — R69 Illness, unspecified: Secondary | ICD-10-CM | POA: Diagnosis not present

## 2020-02-22 DIAGNOSIS — Z0100 Encounter for examination of eyes and vision without abnormal findings: Secondary | ICD-10-CM | POA: Diagnosis not present

## 2020-02-22 DIAGNOSIS — H5203 Hypermetropia, bilateral: Secondary | ICD-10-CM | POA: Diagnosis not present

## 2020-02-28 DIAGNOSIS — E663 Overweight: Secondary | ICD-10-CM | POA: Diagnosis not present

## 2020-02-28 DIAGNOSIS — E063 Autoimmune thyroiditis: Secondary | ICD-10-CM | POA: Diagnosis not present

## 2020-02-28 DIAGNOSIS — E039 Hypothyroidism, unspecified: Secondary | ICD-10-CM | POA: Diagnosis not present

## 2020-02-28 DIAGNOSIS — Z6830 Body mass index (BMI) 30.0-30.9, adult: Secondary | ICD-10-CM | POA: Diagnosis not present

## 2020-02-28 DIAGNOSIS — R5383 Other fatigue: Secondary | ICD-10-CM | POA: Diagnosis not present

## 2020-02-28 DIAGNOSIS — I10 Essential (primary) hypertension: Secondary | ICD-10-CM | POA: Diagnosis not present

## 2020-02-28 DIAGNOSIS — Z0000 Encounter for general adult medical examination without abnormal findings: Secondary | ICD-10-CM | POA: Diagnosis not present

## 2020-02-28 DIAGNOSIS — Z125 Encounter for screening for malignant neoplasm of prostate: Secondary | ICD-10-CM | POA: Diagnosis not present

## 2020-02-28 DIAGNOSIS — Z1389 Encounter for screening for other disorder: Secondary | ICD-10-CM | POA: Diagnosis not present

## 2020-02-28 DIAGNOSIS — E6609 Other obesity due to excess calories: Secondary | ICD-10-CM | POA: Diagnosis not present

## 2020-02-28 DIAGNOSIS — M1991 Primary osteoarthritis, unspecified site: Secondary | ICD-10-CM | POA: Diagnosis not present

## 2020-04-16 DIAGNOSIS — Z23 Encounter for immunization: Secondary | ICD-10-CM | POA: Diagnosis not present

## 2020-08-02 ENCOUNTER — Ambulatory Visit: Payer: Medicare HMO | Attending: Internal Medicine

## 2020-08-02 DIAGNOSIS — Z23 Encounter for immunization: Secondary | ICD-10-CM

## 2020-08-02 NOTE — Progress Notes (Signed)
   Covid-19 Vaccination Clinic  Name:  Jonathan Ochoa    MRN: [id number] DOB: 07/08/1950  08/02/2020  [name] was observed post Covid-19 immunization for 15 minutes without incident. He was provided with Vaccine Information Sheet and instruction to access the V-Safe system.   [name] was instructed to call 911 with any severe reactions post vaccine: Marland Kitchen Difficulty breathing  . Swelling of face and throat  . A fast heartbeat  . A bad rash all over body  . Dizziness and weakness   Immunizations Administered    Name Date Dose VIS Date Route   Moderna Covid-19 Booster Vaccine 08/02/2020  2:37 PM 0.25 mL 04/25/2020 Intramuscular   Manufacturer: Moderna   Lot: [id number]   [place]: [id number]

## 2020-08-16 ENCOUNTER — Other Ambulatory Visit: Payer: Medicare HMO

## 2020-08-16 DIAGNOSIS — Z20822 Contact with and (suspected) exposure to covid-19: Secondary | ICD-10-CM

## 2020-08-18 LAB — SARS-COV-2, NAA 2 DAY TAT

## 2020-08-18 LAB — NOVEL CORONAVIRUS, NAA: SARS-CoV-2, NAA: DETECTED — AB

## 2020-08-19 ENCOUNTER — Telehealth (HOSPITAL_COMMUNITY): Payer: Self-pay | Admitting: Family

## 2020-08-19 NOTE — Telephone Encounter (Signed)
Called to discuss with Mont Dutton about Covid symptoms and the use of casirivimab/imdevimab, a combination monoclonal antibody infusion for those with mild to moderate Covid symptoms and at a high risk of hospitalization.      Pt does not qualify for infusion therapy as symptoms first presented > 10 days prior to timing of infusion (08/08/2021). Symptoms tier reviewed as well as criteria for ending isolation. Preventative practices reviewed. Patient verbalized understanding    Patient Active Problem List   Diagnosis Date Noted  . Hx of adenomatous polyp of colon 09/14/2018  . OA (osteoarthritis) of knee 11/16/2017    Fransheska Willingham,NP

## 2020-09-05 DIAGNOSIS — E890 Postprocedural hypothyroidism: Secondary | ICD-10-CM | POA: Diagnosis not present

## 2020-09-12 DIAGNOSIS — E890 Postprocedural hypothyroidism: Secondary | ICD-10-CM | POA: Diagnosis not present

## 2020-09-21 DIAGNOSIS — M19011 Primary osteoarthritis, right shoulder: Secondary | ICD-10-CM | POA: Diagnosis not present

## 2020-10-18 NOTE — Patient Instructions (Addendum)
DUE TO COVID-19 ONLY ONE VISITOR IS ALLOWED TO COME WITH YOU AND STAY IN THE WAITING ROOM ONLY DURING PRE OP AND PROCEDURE DAY OF SURGERY. THE 1 VISITOR  MAY VISIT WITH YOU AFTER SURGERY IN YOUR PRIVATE ROOM DURING VISITING HOURS ONLY!  10/30/20 at 10:15______, THIS TEST MUST BE DONE BEFORE SURGERY,  COVID TESTING SITE [address] [place] [address], IT IS ON THE RIGHT GOING OUT [address] APPROXIMATELY  2 MINUTES PAST [employer] ON THE RIGHT. ONCE YOUR COVID TEST IS COMPLETED,  PLEASE BEGIN THE QUARANTINE INSTRUCTIONS AS OUTLINED IN YOUR HANDOUT.                Neko Boyajian Enslin    Your procedure is scheduled on: 11/01/20   Report to [facility] Main  Entrance   Report to admitting at   9:55 AM     Call this number if you have problems the morning of surgery [place], NO CHEWING GUM [place].  No food after midnight.    You may have clear liquid until 9:00 AM.    At 8:30 AM drink pre surgery drink.   Nothing by mouth after 9:00 AM.    Take these medicines the morning of surgery with A SIP OF WATER: Levothyroxine                                 You may not have any metal on your body including              piercings  Do not wear jewelry,  lotions, powders or deodorant                           Men may shave face and neck.   Do not bring valuables to the hospital. [place].  Contacts, dentures or bridgework may not be worn into surgery. .     Patients discharged the day of surgery will not be allowed to drive home.  IF YOU ARE HAVING SURGERY AND GOING HOME THE SAME DAY, YOU MUST HAVE AN ADULT TO DRIVE YOU HOME AND BE WITH YOU FOR 24 HOURS.  YOU MAY GO HOME BY TAXI OR UBER OR ORTHERWISE, BUT AN ADULT MUST ACCOMPANY YOU HOME AND STAY WITH YOU FOR 24 HOURS.  Name and phone number of your driver:  Special Instructions:  N/A              Please read over the following fact sheets you were given: _____________________________________________________________________             [facility]- Preparing for Total Shoulder Arthroplasty    Before surgery, you can play an important role. Because skin is not sterile, your skin needs to be as free of germs as possible. You can reduce the number of germs on your skin by using the following products. . Benzoyl Peroxide Gel o Reduces the number of germs present on the skin o Applied twice a day to shoulder area starting two days before surgery    ==================================================================  Please follow these instructions carefully:  BENZOYL PEROXIDE 5% GEL  Please do not use if you have an allergy to benzoyl peroxide.  If your skin becomes reddened/irritated stop using the benzoyl peroxide.  Starting two days before surgery, apply as follows: 1. Apply benzoyl peroxide in the morning and at night. Apply after taking a shower. If you are not taking a shower clean entire shoulder front, back, and side along with the armpit with a clean wet washcloth.  2. Place a quarter-sized dollop on your shoulder and rub in thoroughly, making sure to cover the front, back, and side of your shoulder, along with the armpit.   2 days before ____ AM   ____ PM              1 day before ____ AM   ____ PM                         3. Do this twice a day for two days.  (Last application is the night before surgery, AFTER using the CHG soap as described below).  4. Do NOT apply benzoyl peroxide gel on the day of surgery.   [place] - Preparing for Surgery Before surgery, you can play an important role.  Because skin is not sterile, your skin needs to be as free of germs as possible.  You can reduce the number of germs on your skin by washing with CHG (chlorahexidine gluconate) soap before surgery.  CHG is an antiseptic cleaner which kills germs and bonds with  the skin to continue killing germs even after washing. Please DO NOT use if you have an allergy to CHG or antibacterial soaps.  If your skin becomes reddened/irritated stop using the CHG and inform your nurse when you arrive at Short Stay. You may shave your face/neck.  Please follow these instructions carefully:  1.  Shower with CHG Soap the night before surgery and the  morning of Surgery.  2.  If you choose to wash your hair, wash your hair first as usual with your  normal  shampoo.  3.  After you shampoo, rinse your hair and body thoroughly to remove the  shampoo.                                        4.  Use CHG as you would any other liquid soap.  You can apply chg directly  to the skin and wash                       Gently with a scrungie or clean washcloth.  5.  Apply the CHG Soap to your body ONLY FROM THE NECK DOWN.   Do not use on face/ open                           Wound or open sores. Avoid contact with eyes, ears mouth and genitals (private parts).                       Wash face,  Genitals (private parts) with your normal soap.             6.  Wash thoroughly, paying special attention to the area where your surgery  will be performed.  7.  Thoroughly rinse your body with warm water from the neck down.  8.  DO NOT shower/wash with your normal soap after using and rinsing  off  the CHG Soap.             9.  Pat yourself dry with a clean towel.            10.  Wear clean pajamas.            11.  Place clean sheets on your bed the night of your first shower and do not  sleep with pets. Day of Surgery : Do not apply any lotions/deodorants the morning of surgery.  Please wear clean clothes to the hospital/surgery center.  FAILURE TO FOLLOW THESE INSTRUCTIONS MAY RESULT IN THE CANCELLATION OF YOUR SURGERY PATIENT SIGNATURE_________________________________  NURSE  SIGNATURE__________________________________  ________________________________________________________________________   [name]  An incentive spirometer is a tool that can help keep your lungs clear and active. This tool measures how well you are filling your lungs with each breath. Taking long deep breaths may help reverse or decrease the chance of developing breathing (pulmonary) problems (especially infection) following:  A long period of time when you are unable to move or be active. BEFORE THE PROCEDURE   If the spirometer includes an indicator to show your best effort, your nurse or respiratory therapist will set it to a desired goal.  If possible, sit up straight or lean slightly forward. Try not to slouch.  Hold the incentive spirometer in an upright position. INSTRUCTIONS FOR USE  1. Sit on the edge of your bed if possible, or sit up as far as you can in bed or on a chair. 2. Hold the incentive spirometer in an upright position. 3. Breathe out normally. 4. Place the mouthpiece in your mouth and seal your lips tightly around it. 5. Breathe in slowly and as deeply as possible, raising the piston or the ball toward the top of the column. 6. Hold your breath for 3-5 seconds or for as long as possible. Allow the piston or ball to fall to the bottom of the column. 7. Remove the mouthpiece from your mouth and breathe out normally. 8. Rest for a few seconds and repeat Steps 1 through 7 at least 10 times every 1-2 hours when you are awake. Take your time and take a few normal breaths between deep breaths. 9. The spirometer may include an indicator to show your best effort. Use the indicator as a goal to work toward during each repetition. 10. After each set of 10 deep breaths, practice coughing to be sure your lungs are clear. If you have an incision (the cut made at the time of surgery), support your incision when coughing by placing a pillow or rolled up towels firmly  against it. Once you are able to get out of bed, walk around indoors and cough well. You may stop using the incentive spirometer when instructed by your caregiver.  RISKS AND COMPLICATIONS  Take your time so you do not get dizzy or light-headed.  If you are in pain, you may need to take or ask for pain medication before doing incentive spirometry. It is harder to take a deep breath if you are having pain. AFTER USE  Rest and breathe slowly and easily.  It can be helpful to keep track of a log of your progress. Your caregiver can provide you with a simple table to help with this. If you are using the spirometer at home, follow these instructions: [place] IF:   You are having difficultly using the spirometer.  You have trouble using the spirometer as often  as instructed.  Your pain medication is not giving enough relief while using the spirometer.  You develop fever of 100.5 F (38.1 C) or higher. SEEK IMMEDIATE MEDICAL CARE IF:   You cough up bloody sputum that had not been present before.  You develop fever of 102 F (38.9 C) or greater.  You develop worsening pain at or near the incision site. MAKE SURE YOU:   Understand these instructions.  Will watch your condition.  Will get help right away if you are not doing well or get worse. Document Released: 11/03/2006 Document Revised: 09/15/2011 Document Reviewed: 01/04/2007 [facility] Patient Information 2014 [place], [place].   ________________________________________________________________________

## 2020-10-22 ENCOUNTER — Encounter (HOSPITAL_COMMUNITY)
Admission: RE | Admit: 2020-10-22 | Discharge: 2020-10-22 | Disposition: A | Discharge status: Home / Self Care | Payer: Medicare HMO | Source: Ambulatory Visit | Attending: Orthopedic Surgery | Admitting: Orthopedic Surgery

## 2020-10-22 ENCOUNTER — Other Ambulatory Visit: Payer: Self-pay

## 2020-10-22 ENCOUNTER — Encounter (HOSPITAL_COMMUNITY): Payer: Self-pay

## 2020-10-22 DIAGNOSIS — Z01812 Encounter for preprocedural laboratory examination: Secondary | ICD-10-CM | POA: Diagnosis present

## 2020-10-22 LAB — SURGICAL PCR SCREEN
MRSA, PCR: NEGATIVE
Staphylococcus aureus: NEGATIVE

## 2020-10-22 LAB — CBC
HCT: 41.7 % (ref 39.0–52.0)
Hemoglobin: 13.6 g/dL (ref 13.0–17.0)
MCH: 30.6 pg (ref 26.0–34.0)
MCHC: 32.6 g/dL (ref 30.0–36.0)
MCV: 93.7 fL (ref 80.0–100.0)
Platelets: 239 10*3/uL (ref 150–400)
RBC: 4.45 MIL/uL (ref 4.22–5.81)
RDW: 12.6 % (ref 11.5–15.5)
WBC: 6.5 10*3/uL (ref 4.0–10.5)
nRBC: 0 % (ref 0.0–0.2)

## 2020-10-22 LAB — BASIC METABOLIC PANEL
Anion gap: 6 (ref 5–15)
BUN: 24 mg/dL — ABNORMAL HIGH (ref 8–23)
CO2: 25 mmol/L (ref 22–32)
Calcium: 9.2 mg/dL (ref 8.9–10.3)
Chloride: 106 mmol/L (ref 98–111)
Creatinine, Ser: 0.81 mg/dL (ref 0.61–1.24)
GFR, Estimated: >60 mL/min (ref >60)
Glucose, Bld: 109 mg/dL — ABNORMAL HIGH (ref 70–99)
Potassium: 4.5 mmol/L (ref 3.5–5.1)
Sodium: 137 mmol/L (ref 135–145)

## 2020-10-22 NOTE — Progress Notes (Signed)
COVID Vaccine Completed:Yes Date COVID Vaccine completed:3/21- Booster 1 27/22 COVID vaccine manufacturer:    Moderna     PCP - Dr. Selena Batten Cardiologist - none  Chest x-ray - no EKG - no Stress Test - no ECHO - no Cardiac Cath - no Pacemaker/ICD device last checked:NA  Sleep Study - no CPAP -   Fasting Blood Sugar - NA Checks Blood Sugar _____ times a day  Blood Thinner Instructions:NA Aspirin Instructions: Last Dose:  Anesthesia review:   Patient denies shortness of breath, fever, cough and chest pain at PAT appointment yes  Patient verbalized understanding of instructions that were given to them at the PAT appointment. Patient was also instructed that they will need to review over the PAT instructions again at home before surgery.yes Pt can climb 4 flights of stairs, do housework and ADLs with out SOB.

## 2020-10-30 ENCOUNTER — Other Ambulatory Visit (HOSPITAL_COMMUNITY)
Admission: RE | Admit: 2020-10-30 | Discharge: 2020-10-30 | Disposition: A | Discharge status: Home / Self Care | Payer: Medicare HMO | Source: Ambulatory Visit | Attending: Orthopedic Surgery | Admitting: Orthopedic Surgery

## 2020-11-01 ENCOUNTER — Ambulatory Visit (HOSPITAL_COMMUNITY)
Admission: RE | Admit: 2020-11-01 | Discharge: 2020-11-01 | Disposition: A | Discharge status: Home / Self Care | Payer: Medicare HMO | Source: Ambulatory Visit | Attending: Orthopedic Surgery | Admitting: Orthopedic Surgery

## 2020-11-01 ENCOUNTER — Ambulatory Visit (HOSPITAL_COMMUNITY): Payer: Medicare HMO | Admitting: Anesthesiology

## 2020-11-01 ENCOUNTER — Encounter (HOSPITAL_COMMUNITY): Payer: Self-pay | Admitting: Orthopedic Surgery

## 2020-11-01 ENCOUNTER — Encounter (HOSPITAL_COMMUNITY)
Admission: RE | Disposition: A | Discharge status: Home / Self Care | Payer: Self-pay | Source: Ambulatory Visit | Attending: Orthopedic Surgery

## 2020-11-01 DIAGNOSIS — G8918 Other acute postprocedural pain: Secondary | ICD-10-CM | POA: Diagnosis not present

## 2020-11-01 DIAGNOSIS — M19011 Primary osteoarthritis, right shoulder: Secondary | ICD-10-CM | POA: Insufficient documentation

## 2020-11-01 DIAGNOSIS — Z79890 Hormone replacement therapy (postmenopausal): Secondary | ICD-10-CM | POA: Diagnosis not present

## 2020-11-01 DIAGNOSIS — M25711 Osteophyte, right shoulder: Secondary | ICD-10-CM | POA: Insufficient documentation

## 2020-11-01 DIAGNOSIS — Z96653 Presence of artificial knee joint, bilateral: Secondary | ICD-10-CM | POA: Insufficient documentation

## 2020-11-01 DIAGNOSIS — M24011 Loose body in right shoulder: Secondary | ICD-10-CM | POA: Insufficient documentation

## 2020-11-01 DIAGNOSIS — M179 Osteoarthritis of knee, unspecified: Secondary | ICD-10-CM | POA: Diagnosis not present

## 2020-11-01 DIAGNOSIS — E039 Hypothyroidism, unspecified: Secondary | ICD-10-CM | POA: Diagnosis not present

## 2020-11-01 HISTORY — PX: REVERSE SHOULDER ARTHROPLASTY: SHX5054

## 2020-11-01 SURGERY — ARTHROPLASTY, SHOULDER, TOTAL, REVERSE
Anesthesia: Regional | Site: Shoulder | Laterality: Right

## 2020-11-01 MED ORDER — LACTATED RINGERS IV SOLN: INTRAVENOUS | Status: DC

## 2020-11-01 MED ORDER — ALUM & MAG HYDROXIDE-SIMETH 200-200-20 MG/5ML PO SUSP: 30.0000 mL | ORAL | Status: DC | PRN

## 2020-11-01 MED ORDER — FENTANYL CITRATE (PF) 100 MCG/2ML IJ SOLN
INTRAMUSCULAR | Status: AC
  Filled 2020-11-01: qty 2

## 2020-11-01 MED ORDER — PHENYLEPHRINE 40 MCG/ML (10ML) SYRINGE FOR IV PUSH (FOR BLOOD PRESSURE SUPPORT)
PREFILLED_SYRINGE | INTRAVENOUS | Status: DC | PRN
  Administered 2020-11-01 (×3): 80 ug via INTRAVENOUS
  Administered 2020-11-01 (×2): 40 ug via INTRAVENOUS
  Administered 2020-11-01: 80 ug via INTRAVENOUS
  Administered 2020-11-01: 40 ug via INTRAVENOUS
  Administered 2020-11-01 (×3): 80 ug via INTRAVENOUS
  Administered 2020-11-01: 40 ug via INTRAVENOUS
  Administered 2020-11-01: 80 ug via INTRAVENOUS

## 2020-11-01 MED ORDER — FENTANYL CITRATE (PF) 100 MCG/2ML IJ SOLN: 25.0000 ug | INTRAMUSCULAR | Status: DC | PRN

## 2020-11-01 MED ORDER — LIDOCAINE 2% (20 MG/ML) 5 ML SYRINGE
INTRAMUSCULAR | Status: AC
  Filled 2020-11-01: qty 5

## 2020-11-01 MED ORDER — BISACODYL 5 MG PO TBEC: 5.0000 mg | DELAYED_RELEASE_TABLET | Freq: Every day | ORAL | Status: DC | PRN

## 2020-11-01 MED ORDER — SODIUM CHLORIDE 0.9 % IR SOLN
Status: DC | PRN
  Administered 2020-11-01: 1000 mL

## 2020-11-01 MED ORDER — ROCURONIUM BROMIDE 10 MG/ML (PF) SYRINGE
PREFILLED_SYRINGE | INTRAVENOUS | Status: AC
  Filled 2020-11-01: qty 10

## 2020-11-01 MED ORDER — PANTOPRAZOLE SODIUM 40 MG PO TBEC: 40.0000 mg | DELAYED_RELEASE_TABLET | Freq: Every day | ORAL | Status: DC

## 2020-11-01 MED ORDER — CEFAZOLIN SODIUM-DEXTROSE 2-4 GM/100ML-% IV SOLN
2.0000 g | INTRAVENOUS | Status: AC
  Administered 2020-11-01: 2 g via INTRAVENOUS
  Filled 2020-11-01: qty 100

## 2020-11-01 MED ORDER — PROMETHAZINE HCL 25 MG/ML IJ SOLN: 6.2500 mg | INTRAMUSCULAR | Status: DC | PRN

## 2020-11-01 MED ORDER — LACTATED RINGERS IV BOLUS
250.0000 mL | Freq: Once | INTRAVENOUS | Status: AC
  Administered 2020-11-01: 250 mL via INTRAVENOUS

## 2020-11-01 MED ORDER — MIDAZOLAM HCL 2 MG/2ML IJ SOLN
1.0000 mg | Freq: Once | INTRAMUSCULAR | Status: AC
  Administered 2020-11-01: 2 mg via INTRAVENOUS
  Filled 2020-11-01: qty 2

## 2020-11-01 MED ORDER — VANCOMYCIN HCL 1000 MG IV SOLR
INTRAVENOUS | Status: DC | PRN
  Administered 2020-11-01: 1000 mg via TOPICAL

## 2020-11-01 MED ORDER — ONDANSETRON HCL 4 MG PO TABS: 4.0000 mg | ORAL_TABLET | Freq: Three times a day (TID) | ORAL | 0 refills | Status: AC | PRN

## 2020-11-01 MED ORDER — METOCLOPRAMIDE HCL 5 MG/ML IJ SOLN: 5.0000 mg | Freq: Three times a day (TID) | INTRAMUSCULAR | Status: DC | PRN

## 2020-11-01 MED ORDER — FENTANYL CITRATE (PF) 100 MCG/2ML IJ SOLN
INTRAMUSCULAR | Status: DC | PRN
  Administered 2020-11-01: 100 ug via INTRAVENOUS

## 2020-11-01 MED ORDER — LACTATED RINGERS IV BOLUS
500.0000 mL | Freq: Once | INTRAVENOUS | Status: AC
  Administered 2020-11-01: 500 mL via INTRAVENOUS

## 2020-11-01 MED ORDER — FENTANYL CITRATE (PF) 100 MCG/2ML IJ SOLN
50.0000 ug | Freq: Once | INTRAMUSCULAR | Status: AC
  Administered 2020-11-01: 50 ug via INTRAVENOUS
  Filled 2020-11-01: qty 2

## 2020-11-01 MED ORDER — MAGNESIUM CITRATE PO SOLN: 1.0000 | Freq: Once | ORAL | Status: DC | PRN

## 2020-11-01 MED ORDER — DOCUSATE SODIUM 100 MG PO CAPS: 100.0000 mg | ORAL_CAPSULE | Freq: Two times a day (BID) | ORAL | Status: DC

## 2020-11-01 MED ORDER — METOCLOPRAMIDE HCL 5 MG PO TABS
5.0000 mg | ORAL_TABLET | Freq: Three times a day (TID) | ORAL | Status: DC | PRN
Start: 2020-11-01 — End: 2020-11-01
  Filled 2020-11-01: qty 2

## 2020-11-01 MED ORDER — PHENYLEPHRINE 40 MCG/ML (10ML) SYRINGE FOR IV PUSH (FOR BLOOD PRESSURE SUPPORT)
PREFILLED_SYRINGE | INTRAVENOUS | Status: AC
  Filled 2020-11-01: qty 10

## 2020-11-01 MED ORDER — DEXAMETHASONE SODIUM PHOSPHATE 10 MG/ML IJ SOLN
INTRAMUSCULAR | Status: DC | PRN
  Administered 2020-11-01: 5 mg via INTRAVENOUS

## 2020-11-01 MED ORDER — MENTHOL 3 MG MT LOZG: 1.0000 | LOZENGE | OROMUCOSAL | Status: DC | PRN

## 2020-11-01 MED ORDER — ACETAMINOPHEN 10 MG/ML IV SOLN: 1000.0000 mg | Freq: Once | INTRAVENOUS | Status: DC | PRN

## 2020-11-01 MED ORDER — ROCURONIUM BROMIDE 100 MG/10ML IV SOLN
INTRAVENOUS | Status: DC | PRN
  Administered 2020-11-01: 60 mg via INTRAVENOUS
  Administered 2020-11-01 (×3): 10 mg via INTRAVENOUS

## 2020-11-01 MED ORDER — VANCOMYCIN HCL 1000 MG IV SOLR
INTRAVENOUS | Status: AC
  Filled 2020-11-01: qty 1000

## 2020-11-01 MED ORDER — CHLORHEXIDINE GLUCONATE 0.12 % MT SOLN
15.0000 mL | Freq: Once | OROMUCOSAL | Status: AC
  Administered 2020-11-01: 15 mL via OROMUCOSAL

## 2020-11-01 MED ORDER — ONDANSETRON HCL 4 MG/2ML IJ SOLN
INTRAMUSCULAR | Status: DC | PRN
  Administered 2020-11-01: 4 mg via INTRAVENOUS

## 2020-11-01 MED ORDER — OXYCODONE HCL 5 MG/5ML PO SOLN: 5.0000 mg | Freq: Once | ORAL | Status: DC | PRN

## 2020-11-01 MED ORDER — ONDANSETRON HCL 4 MG/2ML IJ SOLN: 4.0000 mg | Freq: Four times a day (QID) | INTRAMUSCULAR | Status: DC | PRN

## 2020-11-01 MED ORDER — POLYETHYLENE GLYCOL 3350 17 G PO PACK: 17.0000 g | PACK | Freq: Every day | ORAL | Status: DC | PRN

## 2020-11-01 MED ORDER — PHENOL 1.4 % MT LIQD: 1.0000 | OROMUCOSAL | Status: DC | PRN

## 2020-11-01 MED ORDER — TRANEXAMIC ACID-NACL 1000-0.7 MG/100ML-% IV SOLN
1000.0000 mg | INTRAVENOUS | Status: AC
  Administered 2020-11-01: 1000 mg via INTRAVENOUS
  Filled 2020-11-01: qty 100

## 2020-11-01 MED ORDER — ORAL CARE MOUTH RINSE: 15.0000 mL | Freq: Once | OROMUCOSAL | Status: AC

## 2020-11-01 MED ORDER — SUGAMMADEX SODIUM 200 MG/2ML IV SOLN
INTRAVENOUS | Status: DC | PRN
  Administered 2020-11-01: 200 mg via INTRAVENOUS

## 2020-11-01 MED ORDER — ONDANSETRON HCL 4 MG PO TABS
4.0000 mg | ORAL_TABLET | Freq: Four times a day (QID) | ORAL | Status: DC | PRN
  Filled 2020-11-01: qty 1

## 2020-11-01 MED ORDER — CYCLOBENZAPRINE HCL 10 MG PO TABS: 10.0000 mg | ORAL_TABLET | Freq: Three times a day (TID) | ORAL | 1 refills | Status: AC | PRN

## 2020-11-01 MED ORDER — BUPIVACAINE HCL (PF) 0.5 % IJ SOLN
INTRAMUSCULAR | Status: DC | PRN
  Administered 2020-11-01: 15 mL via PERINEURAL

## 2020-11-01 MED ORDER — EPHEDRINE 5 MG/ML INJ
INTRAVENOUS | Status: AC
  Filled 2020-11-01: qty 10

## 2020-11-01 MED ORDER — OXYCODONE-ACETAMINOPHEN 5-325 MG PO TABS: 1.0000 | ORAL_TABLET | ORAL | 0 refills | Status: AC | PRN

## 2020-11-01 MED ORDER — NAPROXEN 500 MG PO TABS: 500.0000 mg | ORAL_TABLET | Freq: Two times a day (BID) | ORAL | 1 refills | Status: AC

## 2020-11-01 MED ORDER — OXYCODONE HCL 5 MG PO TABS: 5.0000 mg | ORAL_TABLET | Freq: Once | ORAL | Status: DC | PRN

## 2020-11-01 MED ORDER — LIDOCAINE 2% (20 MG/ML) 5 ML SYRINGE
INTRAMUSCULAR | Status: DC | PRN
  Administered 2020-11-01: 40 mg via INTRAVENOUS

## 2020-11-01 MED ORDER — PROPOFOL 10 MG/ML IV BOLUS
INTRAVENOUS | Status: DC | PRN
  Administered 2020-11-01: 150 mg via INTRAVENOUS

## 2020-11-01 MED ORDER — BUPIVACAINE LIPOSOME 1.3 % IJ SUSP
INTRAMUSCULAR | Status: DC | PRN
  Administered 2020-11-01: 10 mL via PERINEURAL

## 2020-11-01 MED ORDER — DIPHENHYDRAMINE HCL 12.5 MG/5ML PO ELIX: 12.5000 mg | ORAL_SOLUTION | ORAL | Status: DC | PRN

## 2020-11-01 MED ORDER — EPHEDRINE SULFATE-NACL 50-0.9 MG/10ML-% IV SOSY
PREFILLED_SYRINGE | INTRAVENOUS | Status: DC | PRN
  Administered 2020-11-01: 10 mg via INTRAVENOUS

## 2020-11-01 SURGICAL SUPPLY — 58 items
BLADE SAW SGTL 83.5X18.5 (BLADE) ×2 IMPLANT
COOLER ICEMAN CLASSIC (MISCELLANEOUS) ×2 IMPLANT
COVER BACK TABLE 60X90IN (DRAPES) ×2 IMPLANT
COVER SURGICAL LIGHT HANDLE (MISCELLANEOUS) ×2 IMPLANT
CUP SUT UNIV REVERS 39 NEU (Shoulder) ×2 IMPLANT
DERMABOND ADVANCED (GAUZE/BANDAGES/DRESSINGS) ×1
DERMABOND ADVANCED .7 DNX12 (GAUZE/BANDAGES/DRESSINGS) ×1 IMPLANT
DRAPE INCISE IOBAN 66X45 STRL (DRAPES) ×2 IMPLANT
DRAPE ORTHO SPLIT 77X108 STRL (DRAPES) ×4
DRAPE SHEET LG 3/4 BI-LAMINATE (DRAPES) ×2 IMPLANT
DRAPE SURG 17X11 SM STRL (DRAPES) ×2 IMPLANT
DRAPE SURG ORHT 6 SPLT 77X108 (DRAPES) ×2 IMPLANT
DRAPE U-SHAPE 47X51 STRL (DRAPES) ×2 IMPLANT
DRSG AQUACEL AG ADV 3.5X 6 (GAUZE/BANDAGES/DRESSINGS) ×2 IMPLANT
DURAPREP 26ML APPLICATOR (WOUND CARE) ×2 IMPLANT
ELECT BLADE TIP CTD 4 INCH (ELECTRODE) ×2 IMPLANT
ELECT REM PT RETURN 15FT ADLT (MISCELLANEOUS) ×2 IMPLANT
FACESHIELD WRAPAROUND (MASK) ×8 IMPLANT
GLENOID UNI REV MOD 24 +2 LAT (Joint) ×2 IMPLANT
GLENOSPHERE 39+4 LAT/24 UNI RV (Joint) ×2 IMPLANT
GLOVE SURG ENC MOIS LTX SZ7.5 (GLOVE) ×2 IMPLANT
GLOVE SURG ENC MOIS LTX SZ8 (GLOVE) ×2 IMPLANT
GLOVE SURG MICRO LTX SZ7 (GLOVE) ×2 IMPLANT
GLOVE SURG MICRO LTX SZ7.5 (GLOVE) ×2 IMPLANT
GLOVE SURG SYN 7.0 (GLOVE) IMPLANT
GLOVE SURG SYN 7.5  E (GLOVE)
GLOVE SURG SYN 7.5 E (GLOVE) IMPLANT
GLOVE SURG SYN 8.0 (GLOVE) IMPLANT
GOWN STRL REUS W/TWL LRG LVL3 (GOWN DISPOSABLE) ×4 IMPLANT
INSERT HUMERAL MED 39/ +3 (Shoulder) ×1 IMPLANT
INSERT MEDIUM HUMERAL 39/ +3 (Shoulder) ×2 IMPLANT
KIT BASIN OR (CUSTOM PROCEDURE TRAY) ×2 IMPLANT
KIT TURNOVER KIT A (KITS) ×2 IMPLANT
MANIFOLD NEPTUNE II (INSTRUMENTS) ×2 IMPLANT
NEEDLE TAPERED W/ NITINOL LOOP (MISCELLANEOUS) ×2 IMPLANT
NS IRRIG 1000ML POUR BTL (IV SOLUTION) ×2 IMPLANT
PACK SHOULDER (CUSTOM PROCEDURE TRAY) ×2 IMPLANT
PAD ARMBOARD 7.5X6 YLW CONV (MISCELLANEOUS) ×2 IMPLANT
PAD COLD SHLDR WRAP-ON (PAD) ×2 IMPLANT
RESTRAINT HEAD UNIVERSAL NS (MISCELLANEOUS) ×2 IMPLANT
SCREW CENTRAL MOD 30MM (Screw) ×2 IMPLANT
SCREW PERI LOCK 5.5X32 (Screw) ×2 IMPLANT
SCREW PERI LOCK 5.5X36 (Screw) ×2 IMPLANT
SCREW PERIPHERAL 5.5X20 LOCK (Screw) ×4 IMPLANT
SPACER SHLD UNI REV 39 +6 (Shoulder) ×2 IMPLANT
STEM HUMERAL UNI REV SIZE 12 (Stem) ×2 IMPLANT
SUCTION FRAZIER HANDLE 12FR (TUBING) ×2
SUCTION TUBE FRAZIER 12FR DISP (TUBING) ×1 IMPLANT
SUT FIBERWIRE #2 38 T-5 BLUE (SUTURE)
SUT MNCRL AB 3-0 PS2 18 (SUTURE) ×2 IMPLANT
SUT MON AB 2-0 CT1 36 (SUTURE) ×2 IMPLANT
SUT VIC AB 1 CT1 36 (SUTURE) ×2 IMPLANT
SUTURE FIBERWR #2 38 T-5 BLUE (SUTURE) IMPLANT
SUTURE TAPE 1.3 40 TPR END (SUTURE) ×2 IMPLANT
SUTURETAPE 1.3 40 TPR END (SUTURE) ×4
TOWEL OR 17X26 10 PK STRL BLUE (TOWEL DISPOSABLE) ×2 IMPLANT
TOWEL OR NON WOVEN STRL DISP B (DISPOSABLE) ×2 IMPLANT
WATER STERILE IRR 1000ML POUR (IV SOLUTION) ×4 IMPLANT

## 2020-11-01 NOTE — Transfer of Care (Signed)
Immediate Anesthesia Transfer of Care Note  Patient: Jonathan Ochoa  Procedure(s) Performed: REVERSE SHOULDER ARTHROPLASTY (Right Shoulder)  Patient Location: PACU  Anesthesia Type:General and Regional  Level of Consciousness: drowsy and patient cooperative  Airway & Oxygen Therapy: Patient Spontanous Breathing and Patient connected to face mask oxygen  Post-op Assessment: Report given to RN and Post -op Vital signs reviewed and stable  Post vital signs: Reviewed and stable  Last Vitals:  Vitals Value Taken Time  BP 116/74 11/01/20 1353  Temp    Pulse 61 11/01/20 1356  Resp 12 11/01/20 1356  SpO2 100 % 11/01/20 1356  Vitals shown include unvalidated device data.  Last Pain:  Vitals:   11/01/20 1123  TempSrc:   PainSc: 0-No pain      Patients Stated Pain Goal: 2 (20/94/70 9628)  Complications: No complications documented.

## 2020-11-01 NOTE — Anesthesia Procedure Notes (Addendum)
Anesthesia Regional Block: Interscalene brachial plexus block   Pre-Anesthetic Checklist: ,, timeout performed, Correct Patient, Correct Site, Correct Laterality, Correct Procedure, Correct Position, site marked, Risks and benefits discussed,  Surgical consent,  Pre-op evaluation,  At surgeon's request and post-op pain management  Laterality: Right  Prep: Dura Prep       Needles:  Injection technique: Single-shot  Needle Type: Echogenic Stimulator Needle     Needle Length: 5cm  Needle Gauge: 20     Additional Needles:   Procedures:,,,, ultrasound used (permanent image in chart),,,,  Narrative:  Start time: 11/01/2020 11:20 AM End time: 11/01/2020 11:25 AM Injection made incrementally with aspirations every 5 mL.  Performed by: Personally  Anesthesiologist: Darral Dash, DO  Additional Notes: Patient identified. Risks/Benefits/Options discussed with patient including but not limited to bleeding, infection, nerve damage, failed block, incomplete pain control. Patient expressed understanding and wished to proceed. All questions were answered. Sterile technique was used throughout the entire procedure. Please see nursing notes for vital signs. Aspirated in 5cc intervals with injection for negative confirmation. Patient was given instructions on fall risk and not to get out of bed. All questions and concerns addressed with instructions to call with any issues or inadequate analgesia.

## 2020-11-01 NOTE — Anesthesia Preprocedure Evaluation (Signed)
Anesthesia Evaluation  Patient identified by MRN, date of birth, ID band Patient awake    Reviewed: Allergy & Precautions, NPO status , Patient's Chart, lab work & pertinent test results  Airway Mallampati: I  TM Distance: >3 FB Neck ROM: Full    Dental  (+) Upper Dentures   Pulmonary neg pulmonary ROS,    Pulmonary exam normal        Cardiovascular negative cardio ROS   Rhythm:Regular Rate:Normal     Neuro/Psych negative neurological ROS  negative psych ROS   GI/Hepatic negative GI ROS, Neg liver ROS,   Endo/Other  Hypothyroidism   Renal/GU negative Renal ROS  negative genitourinary   Musculoskeletal  (+) Arthritis , Osteoarthritis,    Abdominal (+)  Abdomen: soft. Bowel sounds: normal.  Peds  Hematology negative hematology ROS (+)   Anesthesia Other Findings   Reproductive/Obstetrics                             Anesthesia Physical Anesthesia Plan  ASA: II  Anesthesia Plan: General and Regional   Post-op Pain Management:  Regional for Post-op pain   Induction: Intravenous  PONV Risk Score and Plan: 2 and Ondansetron, Dexamethasone, Midazolam and Treatment may vary due to age or medical condition  Airway Management Planned: Mask and Oral ETT  Additional Equipment: None  Intra-op Plan:   Post-operative Plan: Extubation in OR  Informed Consent: I have reviewed the patients History and Physical, chart, labs and discussed the procedure including the risks, benefits and alternatives for the proposed anesthesia with the patient or authorized representative who has indicated his/her understanding and acceptance.     Dental advisory given  Plan Discussed with: CRNA  Anesthesia Plan Comments: (Lab Results      Component                Value               Date                      WBC                      6.5                 10/22/2020                HGB                       13.6                10/22/2020                HCT                      41.7                10/22/2020                MCV                      93.7                10/22/2020                PLT  239                 10/22/2020           Lab Results      Component                Value               Date                      NA                       137                 10/22/2020                K                        4.5                 10/22/2020                CO2                      25                  10/22/2020                GLUCOSE                  109 (H)             10/22/2020                BUN                      24 (H)              10/22/2020                CREATININE               0.81                10/22/2020                CALCIUM                  9.2                 10/22/2020                GFRNONAA                 >60                 10/22/2020                GFRAA                    >60                 04/06/2018          )        Anesthesia Quick Evaluation

## 2020-11-01 NOTE — Progress Notes (Signed)
AssistedDr. Jana Half with right, ultrasound guided, interscalene  block. Side rails up, monitors on throughout procedure. See vital signs in flow sheet. Tolerated Procedure well.

## 2020-11-01 NOTE — H&P (Signed)
Pierce Crane Daluz    Chief Complaint: Right shoulder osteoarthritis HPI: The patient is a 70 y.o. male with chronic and progressive increasing right shoulder pain related to severe osteoarthritis and associated rotator cuff dysfunction.  Due to his profoundly restricted mobility and increasing pain and failure to respond to prolonged attempts at conservative management he is brought to the operating this time for planned right shoulder reverse arthroplasty  Past Medical History:  Diagnosis Date  . Arthritis   . Hx of adenomatous polyp of colon 09/14/2018  . Hypothyroidism   . Thyroid disease     Past Surgical History:  Procedure Laterality Date  . adhesions of abdomen     from scar tissue from appendectomy  . AMPUTATION OF REPLICATED TOES     partial tip amputation of left big toe, and next toe   . APPENDECTOMY  1977  . arthroscpy      left knee -meniscus tear  . COLONOSCOPY    . STERIOD INJECTION Right 11/16/2017   Procedure: CORTISONE  INJECTION RIGHT KNEE;  Surgeon: [name], MD;  Location: [facility];  Service: Orthopedics;  Laterality: Right;  . TOE SURGERY    . TOTAL KNEE ARTHROPLASTY Left 11/16/2017   Procedure: LEFT TOTAL KNEE ARTHROPLASTY;  Surgeon: [name], MD;  Location: [facility];  Service: Orthopedics;  Laterality: Left;  . TOTAL KNEE ARTHROPLASTY Right 04/05/2018   Procedure: RIGHT TOTAL KNEE ARTHROPLASTY;  Surgeon: [name], MD;  Location: [facility];  Service: Orthopedics;  Laterality: Right;    Family History  Problem Relation Age of Onset  . Stroke Mother   . Lung cancer Father   . Colon cancer Neg Hx   . Esophageal cancer Neg Hx   . Rectal cancer Neg Hx   . Stomach cancer Neg Hx     Social History:  reports that he has never smoked. He has never used smokeless tobacco. He reports previous alcohol use. He reports that he does not use drugs.   Medications Prior to Admission  Medication Sig Dispense Refill  . acetaminophen (TYLENOL) 500 MG tablet  Take 1,000 mg by mouth every 6 (six) hours as needed (for pain.).    Marland Kitchen Ascorbic Acid (VITAMIN C) 1000 MG tablet Take 1,000 mg by mouth daily.    Marland Kitchen levothyroxine (SYNTHROID, LEVOTHROID) 137 MCG tablet Take 137 mcg by mouth daily before breakfast.    . Multiple Vitamins-Minerals (MULTIVITAMIN WITH MINERALS) tablet Take 1 tablet by mouth daily.    . Omega-3 Fatty Acids (FISH OIL) 1000 MG CAPS Take 2,000 mg by mouth.    . Turmeric 500 MG CAPS Take 500 mg by mouth daily.       Physical Exam: Right shoulder demonstrates profoundly restricted mobility as noted at his recent office visit.  Severe pain with passive motion.  Globally decreased strength.  Overall neurovascular intact in the right upper extremity.  Radiographs  Plain films demonstrate complete obliteration of the joint space with subchondral sclerosis and peripheral osteophyte formation.  Vitals  Temp:  [97.5 F (36.4 C)] 97.5 F (36.4 C) (04/28 0959) Pulse Rate:  [70] 70 (04/28 0959) Resp:  [16] 16 (04/28 0959) BP: (133)/(78) 133/78 (04/28 0959) SpO2:  [99 %] 99 % (04/28 0959) Weight:  [99.3 kg] 99.3 kg (04/28 0956)  Assessment/Plan  Impression: Right shoulder osteoarthritis  Plan of Action: Procedure(s): REVERSE SHOULDER ARTHROPLASTY  Edgel Degnan M Dominque Marlin 11/01/2020, 11:13 AM Contact # [phone]

## 2020-11-01 NOTE — Evaluation (Signed)
Occupational Therapy Evaluation Patient Details Name: Jonathan Ochoa MRN: [id number] DOB: March 21, 1951 Today's Date: 11/01/2020    History of Present Illness Patient s/p R reverse TSA   Clinical Impression   Jonathan Ochoa is a 70 year old man s/p shoulder replacement without functional use of right dominant upper extremity secondary to effects of surgery, interscalene block and shoulder precautions. Therapist provided education and instruction to patient and spouse in regards to exercises, precautions, positioning, donning upper extremity clothing and bathing while maintaining shoulder precautions, ice and edema management with use of ice cuff and cooler and donning/doffing sling. Patient and spouse verbalized understanding and demonstrated education as needed. Patient needed assistance to donn shirt, underwear, pants, socks and shoes and provided with instruction on compensatory strategies to perform ADLs. Handouts provided to maximize retention of education. Patient to follow up with MD for further therapy needs.      Follow Up Recommendations  Follow surgeon's recommendation for DC plan and follow-up therapies    Equipment Recommendations  None recommended by OT    Recommendations for Other Services       Precautions / Restrictions Precautions Precautions: Shoulder Type of Shoulder Precautions: If sitting in controlled environment, ok to come out of sling to give neck a break. Please sleep in it to protect until follow up in office.     OK to use operative arm for feeding, hygiene and ADLs.   Ok to instruct Pendulums and lap slides as exercises. Ok to use operative arm within the following parameters for ADL purposes     New ROM (8/18)   Ok for PROM, AAROM, AROM within pain tolerance and within the following ROM   ER 20   ABD 45   FE 60 Shoulder Interventions: Shoulder abduction pillow;At all times Precaution Booklet Issued:  (handouts) Required Braces or Orthoses:  Sling Restrictions Weight Bearing Restrictions: Yes RUE Weight Bearing: Non weight bearing      Mobility Bed Mobility                    Transfers                      Balance Overall balance assessment: No apparent balance deficits (not formally assessed)                                         ADL either performed or assessed with clinical judgement   ADL                                               Vision Baseline Vision/History: Wears glasses Wears Glasses: At all times       Perception     Praxis      Pertinent Vitals/Pain Pain Assessment: No/denies pain (secondary to interscalene block)     Hand Dominance     Extremity/Trunk Assessment Upper Extremity Assessment Upper Extremity Assessment: RUE deficits/detail RUE Deficits / Details: limited by interscalene block RUE Sensation: decreased light touch   Lower Extremity Assessment Lower Extremity Assessment: Overall WFL for tasks assessed   Cervical / Trunk Assessment Cervical / Trunk Assessment: Normal   Communication     Cognition Arousal/Alertness: Awake/alert Behavior During Therapy: WFL for tasks assessed/performed Overall Cognitive Status:  Within Functional Limits for tasks assessed                                     General Comments       Exercises     Shoulder Instructions Shoulder Instructions Donning/doffing shirt without moving shoulder: Patient able to independently direct caregiver;Caregiver independent with task Method for sponge bathing under operated UE: Caregiver independent with task;Patient able to independently direct caregiver Donning/doffing sling/immobilizer: Patient able to independently direct caregiver;Caregiver independent with task Correct positioning of sling/immobilizer: Caregiver independent with task;Patient able to independently direct caregiver Pendulum exercises (written home exercise program):  Patient able to independently direct caregiver;Caregiver independent with task ROM for elbow, wrist and digits of operated UE: Patient able to independently direct caregiver Sling wearing schedule (on at all times/off for ADL's): Patient able to independently direct caregiver;Caregiver independent with task Proper positioning of operated UE when showering: Caregiver independent with task;Patient able to independently direct caregiver Dressing change: Patient able to independently direct caregiver;Caregiver independent with task Positioning of UE while sleeping: Caregiver independent with task;Patient able to independently direct caregiver    Home Living                                          Prior Functioning/Environment                   OT Problem List: Decreased strength;Decreased range of motion;Impaired UE functional use;Pain      OT Treatment/Interventions:      OT Goals(Current goals can be found in the care plan section) Acute Rehab OT Goals OT Goal Formulation: All assessment and education complete, DC therapy  OT Frequency:     Barriers to D/C:            Co-evaluation              AM-PAC OT "6 Clicks" Daily Activity     Outcome Measure Help from another person eating meals?: A Little Help from another person taking care of personal grooming?: None Help from another person toileting, which includes using toliet, bedpan, or urinal?: None Help from another person bathing (including washing, rinsing, drying)?: A Little Help from another person to put on and taking off regular upper body clothing?: A Lot Help from another person to put on and taking off regular lower body clothing?: A Lot 6 Click Score: 18   End of Session Nurse Communication:  (OT education complete)  Activity Tolerance: Patient tolerated treatment well Patient left: in chair;with family/visitor present  OT Visit Diagnosis: Muscle weakness (generalized) (M62.81)                 Time: 0160-1093 OT Time Calculation (min): 28 min Charges:  OT General Charges $OT Visit: 1 Visit OT Evaluation $OT Eval Low Complexity: 1 Low OT Treatments $Self Care/Home Management : 8-22 mins  Yeslin Delio, OTR/L [place]  Office [phone] Pager: [phone]   Lenward Chancellor 11/01/2020, 4:08 PM

## 2020-11-01 NOTE — Anesthesia Postprocedure Evaluation (Signed)
Anesthesia Post Note  Patient: Jonathan Ochoa  Procedure(s) Performed: REVERSE SHOULDER ARTHROPLASTY (Right Shoulder)     Patient location during evaluation: PACU Anesthesia Type: Regional and General Level of consciousness: awake and alert Pain management: pain level controlled Vital Signs Assessment: post-procedure vital signs reviewed and stable Respiratory status: spontaneous breathing, nonlabored ventilation, respiratory function stable and patient connected to nasal cannula oxygen Cardiovascular status: blood pressure returned to baseline and stable Postop Assessment: no apparent nausea or vomiting Anesthetic complications: no   No complications documented.  Last Vitals:  Vitals:   11/01/20 1430 11/01/20 1445  BP: 104/77 98/66  Pulse: (!) 57 (!) 59  Resp: 12 12  Temp:  (!) 36.1 C  SpO2: 96% 96%    Last Pain:  Vitals:   11/01/20 1445  TempSrc:   PainSc: 0-No pain                 Belenda Cruise P Allana Shrestha

## 2020-11-01 NOTE — Discharge Instructions (Signed)
 Kevin M. Supple, M.D., F.A.A.O.S. Orthopaedic Surgery Specializing in Arthroscopic and Reconstructive Surgery of the Shoulder [phone] [address] - [place], [place] [address] - Fax [phone]   POST-OP TOTAL SHOULDER REPLACEMENT INSTRUCTIONS  1. Follow up in the office for your first post-op appointment 10-14 days from the date of your surgery. If you do not already have a scheduled appointment, our office will contact you to schedule.  2. The bandage over your incision is waterproof. You may begin showering with this dressing on. You may leave this dressing on until first follow up appointment within 2 weeks. We prefer you leave this dressing in place until follow up however after 5-7 days if you are having itching or skin irritation and would like to remove it you may do so. Go slow and tug at the borders gently to break the bond the dressing has with the skin. At this point if there is no drainage it is okay to go without a bandage or you may cover it with a light guaze and tape. You can also expect significant bruising around your shoulder that will drift down your arm and into your chest wall. This is very normal and should resolve over several days.   3. Wear your sling/immobilizer at all times except to perform the exercises below or to occasionally let your arm dangle by your side to stretch your elbow. You also need to sleep in your sling immobilizer until instructed otherwise. It is ok to remove your sling if you are sitting in a controlled environment and allow your arm to rest in a position of comfort by your side or on your lap with pillows to give your neck and skin a break from the sling. You may remove it to allow arm to dangle by side to shower. If you are up walking around and when you go to sleep at night you need to wear it.  4. Range of motion to your elbow, wrist, and hand are encouraged 3-5 times daily. Exercise to your hand and fingers helps to reduce  swelling you may experience.   5. Prescriptions for a pain medication and a muscle relaxant are provided for you. It is recommended that if you are experiencing pain that you pain medication alone is not controlling, add the muscle relaxant along with the pain medication which can give additional pain relief. The first 1-2 days is generally the most severe of your pain and then should gradually decrease. As your pain lessens it is recommended that you decrease your use of the pain medications to an "as needed basis'" only and to always comply with the recommended dosages of the pain medications.  6. Pain medications can produce constipation along with their use. If you experience this, the use of an over the counter stool softener or laxative daily is recommended.   7. For additional questions or concerns, please do not hesitate to call the office. If after hours there is an answering service to forward your concerns to the physician on call.  8.Pain control following an exparel block  To help control your post-operative pain you received a nerve block  performed with Exparel which is a long acting anesthetic (numbing agent) which can provide pain relief and sensations of numbness (and relief of pain) in the operative shoulder and arm for up to 3 days. Sometimes it provides mixed relief, meaning you may still have numbness in certain areas of the arm but can still be able to   move  parts of that arm, hand, and fingers. We recommend that your prescribed pain medications  be used as needed. We do not feel it is necessary to "pre medicate" and "stay ahead" of pain.  Taking narcotic pain medications when you are not having any pain can lead to unnecessary and potentially dangerous side effects.    9. Use the ice machine as much as possible in the first 5-7 days from surgery, then you can wean its use to as needed. The ice typically needs to be replaced every 6 hours, instead of ice you can actually freeze  water bottles to put in the cooler and then fill water around them to avoid having to purchase ice. You can have spare water bottles freezing to allow you to rotate them once they have melted. Try to have a thin shirt or light cloth or towel under the ice wrap to protect your skin.   FOR ADDITIONAL INFO ON ICE MACHINE AND INSTRUCTIONS GO TO THE WEBSITE AT  https://[email or web address].com/products/donjoy/[email or web address]-iceman-[email or web address]  10.  We recommend that you avoid any dental work or cleaning in the first 3 months following your joint replacement. This is to help minimize the possibility of infection from the bacteria in your mouth that enters your bloodstream during dental work. We also recommend that you take an antibiotic prior to your dental work for the first year after your shoulder replacement to further help reduce that risk. Please simply contact our office for antibiotics to be sent to your pharmacy prior to dental work.  11. Dental Antibiotics:  In most cases prophylactic antibiotics for Dental procdeures after total joint surgery are not necessary.  Exceptions are as follows:  1. History of prior total joint infection  2. Severely immunocompromised (Organ Transplant, cancer chemotherapy, Rheumatoid biologic meds such as Humera)  3. Poorly controlled diabetes (A1C &gt; 8.0, blood glucose over 200)  If you have one of these conditions, contact your surgeon for an antibiotic prescription, prior to your dental procedure.   POST-OP EXERCISES  Pendulum Exercises  Perform pendulum exercises while standing and bending at the waist. Support your uninvolved arm on a table or chair and allow your operated arm to hang freely. Make sure to do these exercises passively - not using you shoulder muscles. These exercises can be performed once your nerve block effects have worn off.  Repeat 20 times. Do 3 sessions per day.     

## 2020-11-01 NOTE — Op Note (Signed)
11/01/2020  1:33 PM  PATIENT:   Jonathan Ochoa  70 y.o. male  PRE-OPERATIVE DIAGNOSIS:  Right shoulder osteoarthritis  POST-OPERATIVE DIAGNOSIS: Same  PROCEDURE: Right shoulder reverse arthroplasty utilizing a size 12 Arthrex press-fit stem with a neutral metaphysis, +6 spacer, +3 polyethylene insert, 39/+4 glenosphere on a small/+2 baseplate  SURGEON:  Wenda Vanschaick, Metta Clines M.D.  ASSISTANTS: Jenetta Loges, PA-C  ANESTHESIA:   General endotracheal and interscalene block with Exparel  EBL: 200 cc  SPECIMEN: None  Drains: None   PATIENT DISPOSITION:  PACU - hemodynamically stable.    PLAN OF CARE: Discharge to home after PACU  Brief history:  Patient is a 56-year-old male who has had chronic and progressively increasing right shoulder pain and associated functional limitations related to severe osteoarthritis.  Due to his increasing pain and failure to respond to prolonged attempts at conservative management he is brought to the operating room at this time for planned right shoulder reverse arthroplasty.  Preoperatively, I counseled the patient regarding treatment options and risks versus benefits thereof.  Possible surgical complications were all reviewed including potential for bleeding, infection, neurovascular injury, persistent pain, loss of motion, anesthetic complication, failure of the implant, and possible need for additional surgery. They understand and accept and agrees with our planned procedure.   Procedure detail:  After undergoing routine preop evaluation patient received prophylactic antibiotics and interscalene block with Exparel was established in the holding area by the anesthesia department.  The patient subsequently placed supine on the operating table and underwent the smooth induction of a general endotracheal anesthesia.  Placed into the beachchair position and appropriately padded and protected.  The right shoulder girdle was sterilely prepped and draped in  standard fashion.  Timeout was called.  A deltopectoral approach was made to the right shoulder through a 10 cm incision.  Skin flaps were elevated dissection carried deeply and the deltopectoral interval was developed from proximal to distal with the vein taken laterally.  The conjoined tendon was mobilized and retracted medially and the upper centimeter the pectoralis major was tenotomized for exposure and the long head biceps tendon was then tenodesed at the upper border the pectoralis major tendon with proximal segment unroofed and excised.  The rotator cuff remnant was then split along the rotator interval to the base of the coracoid and the subscap was then separated from the lesser tuberosity using electrocautery and the free margin was tagged with a pair of suture tape sutures.  Capsular attachments were then divided in the anterior and inferior margins of the humeral neck and there was very large osteophytes that we carefully dissected free.  The humeral head was then delivered through the wound.  An extra medullary guide was then used to outline the proposed humeral head resection which was performed with an oscillating saw at approximately 20 degrees retroversion.  Rondure was then used to remove osteophytes from margin of the humeral neck and in addition a large osseous loose body from the axillary pouch was identified and removed.  A metal cap was then placed over the cut proximal humeral surface and the glenoid was then exposed with appropriate retractors.  A circumferential labral resection was completed and again a large segment posteriorly had ossified.  Once complete visualization of the glenoid was achieved we placed a guidepin into the center of the glenoid and reamed to a stable subchondral bony bed with both the central and peripheral reamers and all bony and soft tissue debris was then carefully removed.  Final preparation completed with the central drill and tap.  A 30 mm lag screw was  selected.  The baseplate was assembled and with vancomycin powder applied to the threads the baseplate was inserted with excellent purchase and fixation.  The peripheral locking screws were all then placed using standard technique with excellent purchase.  A 39/+4 glenosphere was then impacted onto the baseplate and the central locking screw was then placed.  We then returned attention back to the proximal humerus where the canal was opened using hand reaming and ultimately broaching up to a size 12 at approximate 25 degrees of retroversion.  A neutral metaphyseal reamer was then utilized to prepare the metaphysis and a trial implant was then placed which showed good motion good stability good soft tissue balance.  At this point the trial was removed and the final implant was assembled.  The canal was irrigated and vancomycin powder was spread liberally.  The final implant was then impacted with excellent purchase and fixation.  A series of trial reductions was then performed and ultimately the plastic spacer with a +3 probably give [place] the best soft tissue balance.  This point the trials were removed.  The +6 spacer was then applied to the implants and tightened appropriately and the +3 polywas then impacted.  Final reduction was performed showing excellent motion good stability good soft tissue balance.  We then confirmed proper mobilization of the subscapularis and this was then repaired back to the eyelets on the collar of the implant using the previously placed suture tape sutures.  The arm easily achieved 45 degrees of external rotation without excessive tension on the subscap repair.  Final irrigation was completed.  Hemostasis was obtained.  The balance of the vancomycin powder was then spread liberally throughout the soft tissue planes.  Deltopectoral interval reapproximated with a series of figure-of-eight #1 Vicryl sutures.  2-0 Monocryl used to the subcu layer and intracuticular 3-0 Monocryl for the skin  followed by Dermabond and Aquacel dressing.  The right arm is in place into a sling and the patient was awakened, extubated, and taken to recovery room in stable condition  Jenetta Loges, PA-C was utilized as an Environmental consultant throughout this case, essential for help with positioning the patient, positioning extremity, tissue manipulation, implantation of the prosthesis, suture management, wound closure, and intraoperative decision-making.  Marin Shutter MD   Contact # [phone]

## 2020-11-01 NOTE — Anesthesia Procedure Notes (Signed)
Procedure Name: Intubation Date/Time: 11/01/2020 12:07 PM Performed by: Gwyndolyn Saxon, CRNA Pre-anesthesia Checklist: Patient identified, Emergency Drugs available, Suction available and Patient being monitored Patient Re-evaluated:Patient Re-evaluated prior to induction Oxygen Delivery Method: Circle system utilized Preoxygenation: Pre-oxygenation with 100% oxygen Induction Type: IV induction Ventilation: Mask ventilation without difficulty and Oral airway inserted - appropriate to patient size Laryngoscope Size: Sabra Heck and 2 Grade View: Grade I Tube type: Oral Tube size: 7.5 mm Number of attempts: 1 Airway Equipment and Method: Patient positioned with wedge pillow and Stylet Placement Confirmation: ETT inserted through vocal cords under direct vision,  positive ETCO2 and breath sounds checked- equal and bilateral Secured at: 22 cm Tube secured with: Tape Dental Injury: Teeth and Oropharynx as per pre-operative assessment

## 2020-11-02 ENCOUNTER — Encounter (HOSPITAL_COMMUNITY): Payer: Self-pay | Admitting: Orthopedic Surgery

## 2020-11-12 DIAGNOSIS — M19011 Primary osteoarthritis, right shoulder: Secondary | ICD-10-CM | POA: Diagnosis not present

## 2020-11-12 DIAGNOSIS — Z96611 Presence of right artificial shoulder joint: Secondary | ICD-10-CM | POA: Diagnosis not present

## 2020-11-19 ENCOUNTER — Ambulatory Visit (HOSPITAL_COMMUNITY): Payer: Medicare HMO | Attending: Orthopedic Surgery

## 2020-11-19 ENCOUNTER — Encounter (HOSPITAL_COMMUNITY): Payer: Self-pay

## 2020-11-19 ENCOUNTER — Other Ambulatory Visit: Payer: Self-pay

## 2020-11-19 DIAGNOSIS — R29898 Other symptoms and signs involving the musculoskeletal system: Secondary | ICD-10-CM | POA: Diagnosis not present

## 2020-11-19 DIAGNOSIS — M25611 Stiffness of right shoulder, not elsewhere classified: Secondary | ICD-10-CM | POA: Diagnosis not present

## 2020-11-19 DIAGNOSIS — M6281 Muscle weakness (generalized): Secondary | ICD-10-CM | POA: Insufficient documentation

## 2020-11-19 NOTE — Therapy (Signed)
[place], [place], [address] Phone: [phone]   Fax:  [phone]  Physical Therapy Evaluation  Patient Details  Name: Jonathan Ochoa MRN: [id number] Date of Birth: 11-Feb-1951 Referring Provider (PT): Justice Britain, MD   Encounter Date: 11/19/2020   PT End of Session - 11/19/20 0809    Visit Number 1    Number of Visits 12    Date for PT Re-Evaluation 12/31/20    Authorization Type [employer] Medicare HMO    PT Start Time [phone]    PT Stop Time 0851    PT Time Calculation (min) 39 min    Activity Tolerance Patient tolerated treatment well    Behavior During Therapy [facility] for tasks assessed/performed           Past Medical History:  Diagnosis Date  . Arthritis   . Hx of adenomatous polyp of colon 09/14/2018  . Hypothyroidism   . Thyroid disease     Past Surgical History:  Procedure Laterality Date  . adhesions of abdomen     from scar tissue from appendectomy  . AMPUTATION OF REPLICATED TOES     partial tip amputation of left big toe, and next toe   . APPENDECTOMY  1977  . arthroscpy      left knee -meniscus tear  . COLONOSCOPY    . REVERSE SHOULDER ARTHROPLASTY Right 11/01/2020   Procedure: REVERSE SHOULDER ARTHROPLASTY;  Surgeon: Justice Britain, MD;  Location: [facility];  Service: Orthopedics;  Laterality: Right;  170min  . STERIOD INJECTION Right 11/16/2017   Procedure: CORTISONE  INJECTION RIGHT KNEE;  Surgeon: [name], MD;  Location: [facility];  Service: Orthopedics;  Laterality: Right;  . TOE SURGERY    . TOTAL KNEE ARTHROPLASTY Left 11/16/2017   Procedure: LEFT TOTAL KNEE ARTHROPLASTY;  Surgeon: [name], MD;  [facility]: [facility];  Service: Orthopedics;  Laterality: Left;  . TOTAL KNEE ARTHROPLASTY Right 04/05/2018   Procedure: RIGHT TOTAL KNEE ARTHROPLASTY;  Surgeon: [name], MD;  [facility];  Service: Orthopedics;  Laterality: Right;    There were no vitals filed for this  visit.    Subjective Assessment - 11/19/20 0812    Subjective PT underwent reverse right total shoulder on 11/01/20 and presents with limited ROM and strength and this is his dominant hand    Currently in Pain? Yes    Pain Score 0-No pain    Pain Location Shoulder    Pain Orientation Right    Pain Descriptors / Indicators Aching    Pain Type Surgical pain    Pain Onset 1 to 4 weeks ago    Pain Frequency Intermittent              [facility] PT Assessment - 11/19/20 0001      Assessment   Medical Diagnosis t artificial shoulder joint    Referring Provider (PT) Justice Britain, MD    Onset Date/Surgical Date 11/01/20    Hand Dominance Right      Balance Screen   Has the patient fallen in the past 6 months No    Has the patient had a decrease in activity level because of a fear of falling?  No    Is the patient reluctant to leave their home because of a fear of falling?  No      Prior Function   Level of Independence Independent    Vocation Retired      Observation/Other Assessments  Focus on Therapeutic Outcomes (FOTO)  38.15% function      ROM / Strength   AROM / PROM / Strength PROM      PROM   PROM Assessment Site Shoulder    Right/Left Shoulder Right    Right Shoulder Extension --   DNT due to surgery precautions   Right Shoulder Flexion 60 Degrees    Right Shoulder ABduction 45 Degrees    Right Shoulder Internal Rotation --   DNT   Right Shoulder External Rotation 20 Degrees      Ambulation/Gait   Ambulation/Gait Yes    Ambulation/Gait Assistance 7: Independent                      Objective measurements completed on examination: See above findings.       [facility] Adult PT Treatment/Exercise - 11/19/20 0001      Exercises   Exercises Shoulder      Shoulder Exercises: Supine   Other Supine Exercises supine AAROM: flexion, ER, abduction.      Shoulder Exercises: Standing   Other Standing Exercises pendulums, elbow flexion 20x                   PT Education - 11/19/20 0855    Education Details review of precautions, protocol stage 1, HEP initiation    Person(s) Educated Patient    Methods Explanation;Demonstration    Comprehension Verbalized understanding;Need further instruction            PT Short Term Goals - 11/19/20 0850      PT SHORT TERM GOAL #1   Title Patient will demo independence in HEP to improve functional outcomes    Time 3    Period Weeks    Status New    Target Date 12/10/20      PT SHORT TERM GOAL #2   Title Patient will meet all stage 1 rehab protocol criteria to progress to stage 2    Time 3    Period Weeks    Status New    Target Date 12/10/20             PT Long Term Goals - 11/19/20 0852      PT LONG TERM GOAL #1   Title Pt will be independent in advanced HEP for self-progression under protocol    Time 6    Period Weeks    Status New    Target Date 12/31/20      PT LONG TERM GOAL #2   Title PAtient will meet FOTO objectives to improve functional outcomes    Baseline 38.15% function    Time 6    Period Weeks    Status New    Target Date 12/31/20      PT LONG TERM GOAL #3   Title Patient will demo right shoulder flexion to 90 degrees in standing to improve functional independence    Baseline DNT    Time 6    Period Weeks    Status New    Target Date 12/31/20                  Plan - 11/19/20 0846    Clinical Impression Statement Pt is 70 yo right-handed male s/p reverse right total shoulder replacement and demo decline in functional independence, limited ROM, strength deficits, decreased knowloedge of precautions, and post-surgical pain indicating PT services to develop and instruct in HEP and POC to improve functional use and outcomes for  RUE    Personal Factors and Comorbidities Time since onset of injury/illness/exacerbation    Examination-Activity Limitations Bathing;Carry;Hygiene/Grooming;Toileting;Reach Overhead    Examination-Participation  Restrictions Cleaning;Community Activity;Driving;Yard Work;Occupation;Meal Prep    Stability/Clinical Decision Making Stable/Uncomplicated    Clinical Decision Making Low    Rehab Potential Excellent    PT Frequency 2x / week    PT Duration 6 weeks    PT Treatment/Interventions ADLs/Self Care Home Management;Aquatic Therapy;Biofeedback;Cryotherapy;Electrical Stimulation;Moist Heat;Gait training;Stair training;Functional mobility training;Therapeutic activities;Therapeutic exercise;Balance training;Patient/family education;Neuromuscular re-education;Manual techniques;Passive range of motion    PT Next Visit Plan continue with reverse TSR protocol, stage 1    PT Home Exercise Plan supine AAROM shoulder ROM    Consulted and Agree with Plan of Care Patient           Patient will benefit from skilled therapeutic intervention in order to improve the following deficits and impairments:  Decreased activity tolerance,Decreased knowledge of precautions,Decreased range of motion,Decreased strength,Impaired UE functional use,Pain  Visit Diagnosis: Decreased range of motion of right shoulder  Muscle weakness (generalized)  Other symptoms and signs involving the musculoskeletal system     Problem List Patient Active Problem List   Diagnosis Date Noted  . Hx of adenomatous polyp of colon 09/14/2018  . OA (osteoarthritis) of knee 11/16/2017   8:56 AM, 11/19/20 M. Sherlyn Lees, PT, DPT Physical Therapist- [place] Office Number: [phone]  [place] [address] [place], [place], [address] Phone: [phone]   Fax:  [phone]  Name: Jonathan Ochoa MRN: [id number] Date of Birth: Sep 30, 1950

## 2020-11-19 NOTE — Patient Instructions (Signed)
Access Code: [email or web address] URL: [email or web address] Date: 11/19/2020 Prepared by: Sherlyn Lees  Exercises Supine Shoulder Flexion Extension AAROM with Dowel - 1 x daily - 7 x weekly - 3 sets - 10 reps Supine Shoulder Flexion AAROM with Hands Clasped - 1 x daily - 7 x weekly - 3 sets - 10 reps Supine Shoulder External Rotation with Dowel - 1 x daily - 7 x weekly - 3 sets - 10 reps Standing Elbow Flexion Extension AROM - 1 x daily - 7 x weekly - 3 sets - 10 reps

## 2020-11-22 ENCOUNTER — Other Ambulatory Visit: Payer: Self-pay

## 2020-11-22 ENCOUNTER — Ambulatory Visit (HOSPITAL_COMMUNITY): Payer: Medicare HMO | Admitting: Physical Therapy

## 2020-11-22 ENCOUNTER — Encounter (HOSPITAL_COMMUNITY): Payer: Self-pay | Admitting: Physical Therapy

## 2020-11-22 DIAGNOSIS — R29898 Other symptoms and signs involving the musculoskeletal system: Secondary | ICD-10-CM | POA: Diagnosis not present

## 2020-11-22 DIAGNOSIS — M6281 Muscle weakness (generalized): Secondary | ICD-10-CM | POA: Diagnosis not present

## 2020-11-22 DIAGNOSIS — M25611 Stiffness of right shoulder, not elsewhere classified: Secondary | ICD-10-CM

## 2020-11-22 NOTE — Therapy (Signed)
Jonathan Ochoa, [place], [address] Phone: [phone]   Fax:  [phone]  Physical Therapy Treatment  Patient Details  Name: Jonathan Ochoa MRN: [id number] Date of Birth: April 27, 1951 Referring Provider (PT): Justice Britain, MD   Encounter Date: 11/22/2020   PT End of Session - 11/22/20 1650    Visit Number 2    Number of Visits 12    Date for PT Re-Evaluation 12/31/20    Authorization Type [employer] Medicare HMO    PT Start Time 5809    PT Stop Time 1730    PT Time Calculation (min) 44 min    Activity Tolerance Patient tolerated treatment well    Behavior During Therapy [facility] for tasks assessed/performed           Past Medical History:  Diagnosis Date  . Arthritis   . Hx of adenomatous polyp of colon 09/14/2018  . Hypothyroidism   . Thyroid disease     Past Surgical History:  Procedure Laterality Date  . adhesions of abdomen     from scar tissue from appendectomy  . AMPUTATION OF REPLICATED TOES     partial tip amputation of left big toe, and next toe   . APPENDECTOMY  1977  . arthroscpy      left knee -meniscus tear  . COLONOSCOPY    . REVERSE SHOULDER ARTHROPLASTY Right 11/01/2020   Procedure: REVERSE SHOULDER ARTHROPLASTY;  Surgeon: Justice Britain, MD;  [facility];  Service: Orthopedics;  Laterality: Right;  161min  . STERIOD INJECTION Right 11/16/2017   Procedure: CORTISONE  INJECTION RIGHT KNEE;  Surgeon: [name], MD;  [facility];  Service: Orthopedics;  Laterality: Right;  . TOE SURGERY    . TOTAL KNEE ARTHROPLASTY Left 11/16/2017   Procedure: LEFT TOTAL KNEE ARTHROPLASTY;  Surgeon: [name], MD;  [facility];  Service: Orthopedics;  Laterality: Left;  . TOTAL KNEE ARTHROPLASTY Right 04/05/2018   Procedure: RIGHT TOTAL KNEE ARTHROPLASTY;  Surgeon: [name], MD;  [facility];  Service: Orthopedics;  Laterality: Right;    There were no vitals filed for this  visit.   Subjective Assessment - 11/22/20 1650    Subjective Patient reports no pain currently. Did ok with HEP. Sleeping off and on in recliner.    Currently in Pain? No/denies    Pain Onset 1 to 4 weeks ago                             [facility] Adult PT Treatment/Exercise - 11/22/20 0001      Shoulder Exercises: Seated   Other Seated Exercises wrist pronation/ supination x 15    Other Seated Exercises towel squeeze isometric 15 x 5", UT stretch 3 x 30", scapular retraction 15 x 5"      Shoulder Exercises: Standing   Other Standing Exercises pendulums 20x , elbow flexion 20x      Manual Therapy   Manual Therapy Soft tissue mobilization;Passive ROM    Manual therapy comments completed seperately from all other skilled interventions    Soft tissue mobilization STM to RT upper trap and bicep    Passive ROM RT GHJ PROM in all planes per protocol and to tolerance                    PT Short Term Goals - 11/19/20 0850      PT SHORT TERM GOAL #1  Title Patient will demo independence in HEP to improve functional outcomes    Time 3    Period Weeks    Status New    Target Date 12/10/20      PT SHORT TERM GOAL #2   Title Patient will meet all stage 1 rehab protocol criteria to progress to stage 2    Time 3    Period Weeks    Status New    Target Date 12/10/20             PT Long Term Goals - 11/19/20 0852      PT LONG TERM GOAL #1   Title Pt will be independent in advanced HEP for self-progression under protocol    Time 6    Period Weeks    Status New    Target Date 12/31/20      PT LONG TERM GOAL #2   Title PAtient will meet FOTO objectives to improve functional outcomes    Baseline 38.15% function    Time 6    Period Weeks    Status New    Target Date 12/31/20      PT LONG TERM GOAL #3   Title Patient will demo right shoulder flexion to 90 degrees in standing to improve functional independence    Baseline DNT    Time 6    Period  Weeks    Status New    Target Date 12/31/20                 Plan - 11/22/20 1742    Clinical Impression Statement Patient tolerated session well. Initiated treatment. Worked on manual PROM to improve shoulder mobility per surgical protocol. Added towel squeeze for grip strength and trapezius stretch to address cervical muscle restriction. Patient reports no pain during session. Will continue to progress per surgical protocol.    Personal Factors and Comorbidities Time since onset of injury/illness/exacerbation    Examination-Activity Limitations Bathing;Carry;Hygiene/Grooming;Toileting;Reach Overhead    Examination-Participation Restrictions Cleaning;Community Activity;Driving;Yard Work;Occupation;Meal Prep    Stability/Clinical Decision Making Stable/Uncomplicated    Rehab Potential Excellent    PT Frequency 2x / week    PT Duration 6 weeks    PT Treatment/Interventions ADLs/Self Care Home Management;Aquatic Therapy;Biofeedback;Cryotherapy;Electrical Stimulation;Moist Heat;Gait training;Stair training;Functional mobility training;Therapeutic activities;Therapeutic exercise;Balance training;Patient/family education;Neuromuscular re-education;Manual techniques;Passive range of motion    PT Next Visit Plan continue with reverse TSR protocol, stage 1    PT Home Exercise Plan supine AAROM shoulder ROM    Consulted and Agree with Plan of Care Patient           Patient will benefit from skilled therapeutic intervention in order to improve the following deficits and impairments:  Decreased activity tolerance,Decreased knowledge of precautions,Decreased range of motion,Decreased strength,Impaired UE functional use,Pain  Visit Diagnosis: Decreased range of motion of right shoulder  Muscle weakness (generalized)  Other symptoms and signs involving the musculoskeletal system     Problem List Patient Active Problem List   Diagnosis Date Noted  . Hx of adenomatous polyp of colon  09/14/2018  . OA (osteoarthritis) of knee 11/16/2017   5:43 PM, 11/22/20 Josue Hector PT DPT  Physical Therapist with [facility]  [phone] [place] [address] [place], [place], [address] Phone: [phone]   Fax:  [phone]  Name: Jonathan Ochoa MRN: [id number] Date of Birth: March 22, 1951

## 2020-11-22 NOTE — Patient Instructions (Signed)
Access Code: [email or web address] URL: [email or web address] Date: 11/22/2020 Prepared by: Josue Hector  Exercises Seated Upper Trapezius Stretch - 3 x daily - 7 x weekly - 1 sets - 3 reps - 30 seconds hold Towel Roll Squeeze - 3 x daily - 7 x weekly - 2 sets - 10 reps - 5 second hold

## 2020-11-26 ENCOUNTER — Ambulatory Visit (HOSPITAL_COMMUNITY): Payer: Medicare HMO

## 2020-11-26 ENCOUNTER — Encounter (HOSPITAL_COMMUNITY): Payer: Self-pay

## 2020-11-26 ENCOUNTER — Other Ambulatory Visit: Payer: Self-pay

## 2020-11-26 DIAGNOSIS — M25611 Stiffness of right shoulder, not elsewhere classified: Secondary | ICD-10-CM | POA: Diagnosis not present

## 2020-11-26 DIAGNOSIS — R29898 Other symptoms and signs involving the musculoskeletal system: Secondary | ICD-10-CM

## 2020-11-26 DIAGNOSIS — M6281 Muscle weakness (generalized): Secondary | ICD-10-CM | POA: Diagnosis not present

## 2020-11-26 NOTE — Therapy (Signed)
[place], [place], [address] Phone: [phone]   Fax:  [phone]  Physical Therapy Treatment  Patient Details  Name: Jonathan Ochoa MRN: [id number] Date of Birth: 08/06/1950 Referring Provider (PT): Justice Britain, MD   Encounter Date: 11/26/2020   PT End of Session - 11/26/20 1019    Visit Number 3    Number of Visits 12    Date for PT Re-Evaluation 12/31/20    Authorization Type [employer] Medicare HMO    PT Start Time 1022    PT Stop Time 1107    PT Time Calculation (min) 45 min    Activity Tolerance Patient tolerated treatment well    Behavior During Therapy [facility] for tasks assessed/performed           Past Medical History:  Diagnosis Date  . Arthritis   . Hx of adenomatous polyp of colon 09/14/2018  . Hypothyroidism   . Thyroid disease     Past Surgical History:  Procedure Laterality Date  . adhesions of abdomen     from scar tissue from appendectomy  . AMPUTATION OF REPLICATED TOES     partial tip amputation of left big toe, and next toe   . APPENDECTOMY  1977  . arthroscpy      left knee -meniscus tear  . COLONOSCOPY    . REVERSE SHOULDER ARTHROPLASTY Right 11/01/2020   Procedure: REVERSE SHOULDER ARTHROPLASTY;  Surgeon: Justice Britain, MD;  [facility];  Service: Orthopedics;  Laterality: Right;  135min  . STERIOD INJECTION Right 11/16/2017   Procedure: CORTISONE  INJECTION RIGHT KNEE;  Surgeon: [name], MD;  [facility];  Service: Orthopedics;  Laterality: Right;  . TOE SURGERY    . TOTAL KNEE ARTHROPLASTY Left 11/16/2017   Procedure: LEFT TOTAL KNEE ARTHROPLASTY;  Surgeon: [name], MD;  [facility];  Service: Orthopedics;  Laterality: Left;  . TOTAL KNEE ARTHROPLASTY Right 04/05/2018   Procedure: RIGHT TOTAL KNEE ARTHROPLASTY;  Surgeon: [name], MD;  [facility];  Service: Orthopedics;  Laterality: Right;    There were no vitals filed for this  visit.   Subjective Assessment - 11/26/20 1026    Subjective No issues and reports arm is feeling better and he has been walking around the house some without the sling which OK'ed by the surgeon    Currently in Pain? No/denies    Pain Score 0-No pain    Pain Orientation Right    Pain Onset 1 to 4 weeks ago              [facility] PT Assessment - 11/26/20 0001      Assessment   Medical Diagnosis Rt artificial shoulder joint    Referring Provider (PT) Justice Britain, MD    Onset Date/Surgical Date 11/01/20    Hand Dominance Right    Next MD Visit 12/14/20                         [facility] Adult PT Treatment/Exercise - 11/26/20 0001      Shoulder Exercises: Supine   Other Supine Exercises supine AAROM: flexion, ER, abduction.   in the scapular plane, 2x10     Shoulder Exercises: Seated   Retraction Strengthening;Both;20 reps   shoulders in neutral   Retraction Limitations sitting in neutral posture    Flexion AAROM;20 reps    Flexion Limitations arm placed on countertop, using body to mobilize in the  plane    Abduction AAROM;20 reps    ABduction Limitations placed on countertop, using body to move into plane    Other Seated Exercises wrist pronation/ supination x 15    Other Seated Exercises towel squeeze isometric 15 x 5", UT stretch 3 x 30", scapular retraction 15 x 5"      Shoulder Exercises: Standing   Other Standing Exercises pendulums x 2 min flexion, circular      Manual Therapy   Manual Therapy Passive ROM    Manual therapy comments completed seperately from all other skilled interventions    Passive ROM RT GHJ PROM in all planes (within scapular plane) per protocol and to tolerance                    PT Short Term Goals - 11/26/20 1100      PT SHORT TERM GOAL #1   Title Patient will demo independence in HEP to improve functional outcomes    Time 3    Period Weeks    Status On-going    Target Date 12/10/20      PT SHORT TERM GOAL #2    Title Patient will meet all stage 1 rehab protocol criteria to progress to stage 2    Time 3    Period Weeks    Status On-going    Target Date 12/10/20             PT Long Term Goals - 11/19/20 0852      PT LONG TERM GOAL #1   Title Pt will be independent in advanced HEP for self-progression under protocol    Time 6    Period Weeks    Status New    Target Date 12/31/20      PT LONG TERM GOAL #2   Title PAtient will meet FOTO objectives to improve functional outcomes    Baseline 38.15% function    Time 6    Period Weeks    Status New    Target Date 12/31/20      PT LONG TERM GOAL #3   Title Patient will demo right shoulder flexion to 90 degrees in standing to improve functional independence    Baseline DNT    Time 6    Period Weeks    Status New    Target Date 12/31/20                 Plan - 11/26/20 1058    Clinical Impression Statement Tolerating tx sessions well and demo good compliance with precautions and initial HEP.  Ready to progress to Phase 2 at next session as he will now be 4-weeks post-op and has met the criteria to progress with AAROM-AROM and deltoid isometrics    Personal Factors and Comorbidities Time since onset of injury/illness/exacerbation    Examination-Activity Limitations Bathing;Carry;Hygiene/Grooming;Toileting;Reach Overhead    Examination-Participation Restrictions Cleaning;Community Activity;Driving;Yard Work;Occupation;Meal Prep    Stability/Clinical Decision Making Stable/Uncomplicated    Rehab Potential Excellent    PT Frequency 2x / week    PT Duration 6 weeks    PT Treatment/Interventions ADLs/Self Care Home Management;Aquatic Therapy;Biofeedback;Cryotherapy;Electrical Stimulation;Moist Heat;Gait training;Stair training;Functional mobility training;Therapeutic activities;Therapeutic exercise;Balance training;Patient/family education;Neuromuscular re-education;Manual techniques;Passive range of motion    PT Next Visit Plan continue  with reverse TSR protocol, stage 2. AAROM-AROM in scapular plane, deltoid isometrics    PT Home Exercise Plan supine AAROM shoulder ROM    Consulted and Agree with Plan of Care Patient  Patient will benefit from skilled therapeutic intervention in order to improve the following deficits and impairments:  Decreased activity tolerance,Decreased knowledge of precautions,Decreased range of motion,Decreased strength,Impaired UE functional use,Pain  Visit Diagnosis: Decreased range of motion of right shoulder  Muscle weakness (generalized)  Other symptoms and signs involving the musculoskeletal system     Problem List Patient Active Problem List   Diagnosis Date Noted  . Hx of adenomatous polyp of colon 09/14/2018  . OA (osteoarthritis) of knee 11/16/2017   11:06 AM, 11/26/20 M. Sherlyn Lees, PT, DPT Physical Therapist- [place] Office Number: [phone]  [place] [address] [place], [place], [address] Phone: [phone]   Fax:  [phone]  Name: KETHAN PAPADOPOULOS MRN: [id number] Date of Birth: Apr 27, 1951

## 2020-11-30 ENCOUNTER — Ambulatory Visit (HOSPITAL_COMMUNITY): Payer: Medicare HMO

## 2020-11-30 ENCOUNTER — Other Ambulatory Visit: Payer: Self-pay

## 2020-11-30 DIAGNOSIS — M25611 Stiffness of right shoulder, not elsewhere classified: Secondary | ICD-10-CM | POA: Diagnosis not present

## 2020-11-30 DIAGNOSIS — M6281 Muscle weakness (generalized): Secondary | ICD-10-CM | POA: Diagnosis not present

## 2020-11-30 DIAGNOSIS — R29898 Other symptoms and signs involving the musculoskeletal system: Secondary | ICD-10-CM

## 2020-11-30 NOTE — Therapy (Signed)
[place], [place], [address] Phone: [phone]   Fax:  [phone]  Physical Therapy Treatment  Patient Details  Name: Jonathan Ochoa MRN: [id number] Date of Birth: 11-13-1950 Referring Provider (PT): Justice Britain, MD   Encounter Date: 11/30/2020   PT End of Session - 11/30/20 1047    Visit Number 4    Number of Visits 12    Date for PT Re-Evaluation 12/31/20    Authorization Type [employer] HMO    PT Start Time 1030    PT Stop Time 1115    PT Time Calculation (min) 45 min    Activity Tolerance Patient tolerated treatment well    Behavior During Therapy [facility] for tasks assessed/performed           Past Medical History:  Diagnosis Date  . Arthritis   . Hx of adenomatous polyp of colon 09/14/2018  . Hypothyroidism   . Thyroid disease     Past Surgical History:  Procedure Laterality Date  . adhesions of abdomen     from scar tissue from appendectomy  . AMPUTATION OF REPLICATED TOES     partial tip amputation of left big toe, and next toe   . APPENDECTOMY  1977  . arthroscpy      left knee -meniscus tear  . COLONOSCOPY    . REVERSE SHOULDER ARTHROPLASTY Right 11/01/2020   Procedure: REVERSE SHOULDER ARTHROPLASTY;  Surgeon: Justice Britain, MD;  Location: [facility];  Service: Orthopedics;  Laterality: Right;  154min  . STERIOD INJECTION Right 11/16/2017   Procedure: CORTISONE  INJECTION RIGHT KNEE;  Surgeon: [name], MD;  Location: [facility];  Service: Orthopedics;  Laterality: Right;  . TOE SURGERY    . TOTAL KNEE ARTHROPLASTY Left 11/16/2017   Procedure: LEFT TOTAL KNEE ARTHROPLASTY;  Surgeon: [name], MD;  Location: [facility];  Service: Orthopedics;  Laterality: Left;  . TOTAL KNEE ARTHROPLASTY Right 04/05/2018   Procedure: RIGHT TOTAL KNEE ARTHROPLASTY;  Surgeon: [name], MD;  [facility];  Service: Orthopedics;  Laterality: Right;    There were no vitals filed for this  visit.   Subjective Assessment - 11/30/20 1046    Subjective Pt notes some soreness in the right shoulder with the rainy weather but other than that no issue    Currently in Pain? No/denies    Pain Score 0-No pain    Pain Location Shoulder    Pain Orientation Right    Pain Onset 1 to 4 weeks ago              [facility] PT Assessment - 11/30/20 0001      Assessment   Medical Diagnosis Rt artificial shoulder joint    Referring Provider (PT) Justice Britain, MD    Onset Date/Surgical Date 11/01/20    Hand Dominance Right    Next MD Visit 12/14/20                         [facility] Adult PT Treatment/Exercise - 11/30/20 0001      Shoulder Exercises: Supine   Other Supine Exercises supine AAROM: flexion, ER, abduction. 3x10      Shoulder Exercises: Standing   Other Standing Exercises pendulums x 2 min flexion, circular    Other Standing Exercises right shoulder flexion/abduction isometrics 2x10      Manual Therapy   Manual Therapy Passive ROM    Manual therapy comments completed seperately from all  other skilled interventions    Passive ROM RT GHJ PROM in all planes (within scapular plane) per protocol and to tolerance                    PT Short Term Goals - 11/26/20 1100      PT SHORT TERM GOAL #1   Title Patient will demo independence in HEP to improve functional outcomes    Time 3    Period Weeks    Status On-going    Target Date 12/10/20      PT SHORT TERM GOAL #2   Title Patient will meet all stage 1 rehab protocol criteria to progress to stage 2    Time 3    Period Weeks    Status On-going    Target Date 12/10/20             PT Long Term Goals - 11/19/20 0852      PT LONG TERM GOAL #1   Title Pt will be independent in advanced HEP for self-progression under protocol    Time 6    Period Weeks    Status New    Target Date 12/31/20      PT LONG TERM GOAL #2   Title PAtient will meet FOTO objectives to improve functional outcomes     Baseline 38.15% function    Time 6    Period Weeks    Status New    Target Date 12/31/20      PT LONG TERM GOAL #3   Title Patient will demo right shoulder flexion to 90 degrees in standing to improve functional independence    Baseline DNT    Time 6    Period Weeks    Status New    Target Date 12/31/20                 Plan - 11/30/20 1056    Clinical Impression Statement Tolerating motion to right shoulder very well with [facility] ER to 30 degrees, flexion to 100 degrees, and abduction to 80 degrees and able to initiate sub-max isometric contractions for flexion and abduction without adverse effect.  Continued POC indicated to progress protocol to stage 2 in another 2 weeks at the 6 week post-op mark.    Personal Factors and Comorbidities Time since onset of injury/illness/exacerbation    Examination-Activity Limitations Bathing;Carry;Hygiene/Grooming;Toileting;Reach Overhead    Examination-Participation Restrictions Cleaning;Community Activity;Driving;Yard Work;Occupation;Meal Prep    Stability/Clinical Decision Making Stable/Uncomplicated    Rehab Potential Excellent    PT Frequency 2x / week    PT Duration 6 weeks    PT Treatment/Interventions ADLs/Self Care Home Management;Aquatic Therapy;Biofeedback;Cryotherapy;Electrical Stimulation;Moist Heat;Gait training;Stair training;Functional mobility training;Therapeutic activities;Therapeutic exercise;Balance training;Patient/family education;Neuromuscular re-education;Manual techniques;Passive range of motion    PT Next Visit Plan continue with reverse TSR protocol, stage 2. AAROM-AROM in scapular plane, deltoid isometrics    PT Home Exercise Plan supine AAROM shoulder ROM    Consulted and Agree with Plan of Care Patient           Patient will benefit from skilled therapeutic intervention in order to improve the following deficits and impairments:  Decreased activity tolerance,Decreased knowledge of precautions,Decreased  range of motion,Decreased strength,Impaired UE functional use,Pain  Visit Diagnosis: Decreased range of motion of right shoulder  Muscle weakness (generalized)  Other symptoms and signs involving the musculoskeletal system     Problem List Patient Active Problem List   Diagnosis Date Noted  . Hx of adenomatous polyp of colon  09/14/2018  . OA (osteoarthritis) of knee 11/16/2017   11:16 AM, 11/30/20 M. Sherlyn Lees, PT, DPT Physical Therapist- [place] Office Number: [phone]  [place] [address] [place], [place], [address] Phone: [phone]   Fax:  [phone]  Name: Jonathan Ochoa MRN: [id number] Date of Birth: 1951-03-05

## 2020-12-04 ENCOUNTER — Ambulatory Visit (HOSPITAL_COMMUNITY): Payer: Medicare HMO | Admitting: Physical Therapy

## 2020-12-04 ENCOUNTER — Encounter (HOSPITAL_COMMUNITY): Payer: Self-pay | Admitting: Physical Therapy

## 2020-12-04 ENCOUNTER — Other Ambulatory Visit: Payer: Self-pay

## 2020-12-04 DIAGNOSIS — M25611 Stiffness of right shoulder, not elsewhere classified: Secondary | ICD-10-CM

## 2020-12-04 DIAGNOSIS — M6281 Muscle weakness (generalized): Secondary | ICD-10-CM | POA: Diagnosis not present

## 2020-12-04 DIAGNOSIS — R29898 Other symptoms and signs involving the musculoskeletal system: Secondary | ICD-10-CM

## 2020-12-04 NOTE — Therapy (Signed)
[place], [place], [address] Phone: [phone]   Fax:  [phone]  Physical Therapy Treatment  Patient Details  Name: Jonathan Ochoa MRN: [id number] Date of Birth: 23-Dec-1950 Referring Provider (PT): Justice Britain, MD   Encounter Date: 12/04/2020   PT End of Session - 12/04/20 0907    Visit Number 5    Number of Visits 12    Date for PT Re-Evaluation 12/31/20    Authorization Type [employer] Medicare HMO    PT Start Time 0901    PT Stop Time 0942    PT Time Calculation (min) 41 min    Activity Tolerance Patient tolerated treatment well    Behavior During Therapy [facility] for tasks assessed/performed           Past Medical History:  Diagnosis Date  . Arthritis   . Hx of adenomatous polyp of colon 09/14/2018  . Hypothyroidism   . Thyroid disease     Past Surgical History:  Procedure Laterality Date  . adhesions of abdomen     from scar tissue from appendectomy  . AMPUTATION OF REPLICATED TOES     partial tip amputation of left big toe, and next toe   . APPENDECTOMY  1977  . arthroscpy      left knee -meniscus tear  . COLONOSCOPY    . REVERSE SHOULDER ARTHROPLASTY Right 11/01/2020   Procedure: REVERSE SHOULDER ARTHROPLASTY;  Surgeon: Justice Britain, MD;  Location: [facility];  Service: Orthopedics;  Laterality: Right;  122min  . STERIOD INJECTION Right 11/16/2017   Procedure: CORTISONE  INJECTION RIGHT KNEE;  Surgeon: [name], MD;  Location: [facility];  Service: Orthopedics;  Laterality: Right;  . TOE SURGERY    . TOTAL KNEE ARTHROPLASTY Left 11/16/2017   Procedure: LEFT TOTAL KNEE ARTHROPLASTY;  Surgeon: [name], MD;  [facility]: [facility];  Service: Orthopedics;  Laterality: Left;  . TOTAL KNEE ARTHROPLASTY Right 04/05/2018   Procedure: RIGHT TOTAL KNEE ARTHROPLASTY;  Surgeon: [name], MD;  Location: [facility];  Service: Orthopedics;  Laterality: Right;    There were no vitals filed for this  visit.   Subjective Assessment - 12/04/20 0906    Subjective Patient says shoulder is doing well. No pain currently. Gets a little stiff sometimes, hand aches occasionally. Did well with all new exercises.    Currently in Pain? No/denies    Pain Onset 1 to 4 weeks ago                             [facility] Adult PT Treatment/Exercise - 12/04/20 0001      Shoulder Exercises: Supine   Other Supine Exercises supine AAROM: flexion, ER, abduction. 3x10      Shoulder Exercises: Seated   Retraction 15 reps      Shoulder Exercises: Standing   Other Standing Exercises pendulums x20 CW/ CCW    Other Standing Exercises right shoulder flexion/abduction isometrics 15 x5"      Manual Therapy   Manual Therapy Passive ROM    Manual therapy comments completed seperately from all other skilled interventions    Passive ROM RT GHJ PROM in all planes per protocol and to tolerance                    PT Short Term Goals - 11/26/20 1100      PT SHORT TERM GOAL #1   Title Patient will  demo independence in HEP to improve functional outcomes    Time 3    Period Weeks    Status On-going    Target Date 12/10/20      PT SHORT TERM GOAL #2   Title Patient will meet all stage 1 rehab protocol criteria to progress to stage 2    Time 3    Period Weeks    Status On-going    Target Date 12/10/20             PT Long Term Goals - 11/19/20 0852      PT LONG TERM GOAL #1   Title Pt will be independent in advanced HEP for self-progression under protocol    Time 6    Period Weeks    Status New    Target Date 12/31/20      PT LONG TERM GOAL #2   Title PAtient will meet FOTO objectives to improve functional outcomes    Baseline 38.15% function    Time 6    Period Weeks    Status New    Target Date 12/31/20      PT LONG TERM GOAL #3   Title Patient will demo right shoulder flexion to 90 degrees in standing to improve functional independence    Baseline DNT    Time 6     Period Weeks    Status New    Target Date 12/31/20                 Plan - 12/04/20 0940    Clinical Impression Statement Patient tolerated session well today with no increased complaint of pain. Showing improved PROM. Patient cued on proper mechanics with scapular retraction, and body mechanics of shoulder abduction and ER AAROM. Patient will continue to benefit from progressions per surgical protocol to improve functional ability.    Personal Factors and Comorbidities Time since onset of injury/illness/exacerbation    Examination-Activity Limitations Bathing;Carry;Hygiene/Grooming;Toileting;Reach Overhead    Examination-Participation Restrictions Cleaning;Community Activity;Driving;Yard Work;Occupation;Meal Prep    Stability/Clinical Decision Making Stable/Uncomplicated    Rehab Potential Excellent    PT Frequency 2x / week    PT Duration 6 weeks    PT Treatment/Interventions ADLs/Self Care Home Management;Aquatic Therapy;Biofeedback;Cryotherapy;Electrical Stimulation;Moist Heat;Gait training;Stair training;Functional mobility training;Therapeutic activities;Therapeutic exercise;Balance training;Patient/family education;Neuromuscular re-education;Manual techniques;Passive range of motion    PT Next Visit Plan continue with reverse TSR protocol, stage 2. AAROM-AROM in scapular plane, deltoid isometrics    PT Home Exercise Plan supine AAROM shoulder ROM    Consulted and Agree with Plan of Care Patient           Patient will benefit from skilled therapeutic intervention in order to improve the following deficits and impairments:  Decreased activity tolerance,Decreased knowledge of precautions,Decreased range of motion,Decreased strength,Impaired UE functional use,Pain  Visit Diagnosis: Decreased range of motion of right shoulder  Muscle weakness (generalized)  Other symptoms and signs involving the musculoskeletal system     Problem List Patient Active Problem List    Diagnosis Date Noted  . Hx of adenomatous polyp of colon 09/14/2018  . OA (osteoarthritis) of knee 11/16/2017   9:44 AM, 12/04/20 Josue Hector PT DPT  Physical Therapist with [facility]  [phone] \ [facility] [facility] [address] [place], [place], [address] Phone: [phone]   Fax:  [phone]  Name: Jonathan Ochoa MRN: [id number] Date of Birth: Feb 24, 1951

## 2020-12-06 ENCOUNTER — Ambulatory Visit (HOSPITAL_COMMUNITY): Payer: Medicare HMO | Attending: Orthopedic Surgery | Admitting: Physical Therapy

## 2020-12-06 ENCOUNTER — Other Ambulatory Visit: Payer: Self-pay

## 2020-12-06 ENCOUNTER — Encounter (HOSPITAL_COMMUNITY): Payer: Self-pay | Admitting: Physical Therapy

## 2020-12-06 DIAGNOSIS — R29898 Other symptoms and signs involving the musculoskeletal system: Secondary | ICD-10-CM | POA: Diagnosis not present

## 2020-12-06 DIAGNOSIS — M6281 Muscle weakness (generalized): Secondary | ICD-10-CM | POA: Diagnosis not present

## 2020-12-06 DIAGNOSIS — M25611 Stiffness of right shoulder, not elsewhere classified: Secondary | ICD-10-CM | POA: Diagnosis not present

## 2020-12-06 NOTE — Therapy (Signed)
[place], [place], [address] Phone: [phone]   Fax:  [phone]  Physical Therapy Treatment  Patient Details  Name: CHURCHILL GRIMSLEY MRN: [id number] Date of Birth: 06-21-1951 Referring Provider (PT): Justice Britain, MD   Encounter Date: 12/06/2020   PT End of Session - 12/06/20 1651    Visit Number 6    Number of Visits 12    Date for PT Re-Evaluation 12/31/20    Authorization Type [employer] Medicare HMO    PT Start Time 3419    PT Stop Time 1733    PT Time Calculation (min) 47 min    Activity Tolerance Patient tolerated treatment well    Behavior During Therapy [facility] for tasks assessed/performed           Past Medical History:  Diagnosis Date  . Arthritis   . Hx of adenomatous polyp of colon 09/14/2018  . Hypothyroidism   . Thyroid disease     Past Surgical History:  Procedure Laterality Date  . adhesions of abdomen     from scar tissue from appendectomy  . AMPUTATION OF REPLICATED TOES     partial tip amputation of left big toe, and next toe   . APPENDECTOMY  1977  . arthroscpy      left knee -meniscus tear  . COLONOSCOPY    . REVERSE SHOULDER ARTHROPLASTY Right 11/01/2020   Procedure: REVERSE SHOULDER ARTHROPLASTY;  Surgeon: Justice Britain, MD;  [facility]: [facility];  Service: Orthopedics;  Laterality: Right;  176min  . STERIOD INJECTION Right 11/16/2017   Procedure: CORTISONE  INJECTION RIGHT KNEE;  Surgeon: [name], MD;  Location: [facility];  Service: Orthopedics;  Laterality: Right;  . TOE SURGERY    . TOTAL KNEE ARTHROPLASTY Left 11/16/2017   Procedure: LEFT TOTAL KNEE ARTHROPLASTY;  Surgeon: [name], MD;  Location: [facility];  Service: Orthopedics;  Laterality: Left;  . TOTAL KNEE ARTHROPLASTY Right 04/05/2018   Procedure: RIGHT TOTAL KNEE ARTHROPLASTY;  Surgeon: [name], MD;  Location: [facility];  Service: Orthopedics;  Laterality: Right;    There were no vitals filed for this  visit.   Subjective Assessment - 12/06/20 1651    Subjective Doing well, no pain. Some pain in RT hand at times, none currently.    Currently in Pain? No/denies    Pain Onset 1 to 4 weeks ago                             [facility] Adult PT Treatment/Exercise - 12/06/20 0001      Shoulder Exercises: Supine   Other Supine Exercises supine AAROM: flexion, ER, abduction. 3x10      Shoulder Exercises: Seated   Retraction 15 reps      Shoulder Exercises: Standing   Other Standing Exercises pendulums x20 CW/ CCW    Other Standing Exercises right shoulder flexion/abduction isometrics 10 x5", elbow flexion with dowel rod x20      Manual Therapy   Manual Therapy Passive ROM    Manual therapy comments completed seperately from all other skilled interventions    Passive ROM RT GHJ PROM in all planes per protocol and to tolerance                    PT Short Term Goals - 11/26/20 1100      PT SHORT TERM GOAL #1   Title Patient will demo independence in HEP  to improve functional outcomes    Time 3    Period Weeks    Status On-going    Target Date 12/10/20      PT SHORT TERM GOAL #2   Title Patient will meet all stage 1 rehab protocol criteria to progress to stage 2    Time 3    Period Weeks    Status On-going    Target Date 12/10/20             PT Long Term Goals - 11/19/20 0852      PT LONG TERM GOAL #1   Title Pt will be independent in advanced HEP for self-progression under protocol    Time 6    Period Weeks    Status New    Target Date 12/31/20      PT LONG TERM GOAL #2   Title PAtient will meet FOTO objectives to improve functional outcomes    Baseline 38.15% function    Time 6    Period Weeks    Status New    Target Date 12/31/20      PT LONG TERM GOAL #3   Title Patient will demo right shoulder flexion to 90 degrees in standing to improve functional independence    Baseline DNT    Time 6    Period Weeks    Status New    Target  Date 12/31/20                 Plan - 12/06/20 1730    Clinical Impression Statement Patient progressing well. Showing improving pain free shoulder PROM. Added elbow flexion with dowel rod. Patient showing good return and no increased complaint of pain. Will continue to progress per surgical protocol to return patient to PLOF with ADLs.    Personal Factors and Comorbidities Time since onset of injury/illness/exacerbation    Examination-Activity Limitations Bathing;Carry;Hygiene/Grooming;Toileting;Reach Overhead    Examination-Participation Restrictions Cleaning;Community Activity;Driving;Yard Work;Occupation;Meal Prep    Stability/Clinical Decision Making Stable/Uncomplicated    Rehab Potential Excellent    PT Frequency 2x / week    PT Duration 6 weeks    PT Treatment/Interventions ADLs/Self Care Home Management;Aquatic Therapy;Biofeedback;Cryotherapy;Electrical Stimulation;Moist Heat;Gait training;Stair training;Functional mobility training;Therapeutic activities;Therapeutic exercise;Balance training;Patient/family education;Neuromuscular re-education;Manual techniques;Passive range of motion    PT Next Visit Plan continue with reverse TSR protocol, stage 2. AAROM-AROM in scapular plane, deltoid isometrics    PT Home Exercise Plan supine AAROM shoulder ROM    Consulted and Agree with Plan of Care Patient           Patient will benefit from skilled therapeutic intervention in order to improve the following deficits and impairments:  Decreased activity tolerance,Decreased knowledge of precautions,Decreased range of motion,Decreased strength,Impaired UE functional use,Pain  Visit Diagnosis: Decreased range of motion of right shoulder  Muscle weakness (generalized)  Other symptoms and signs involving the musculoskeletal system     Problem List Patient Active Problem List   Diagnosis Date Noted  . Hx of adenomatous polyp of colon 09/14/2018  . OA (osteoarthritis) of knee  11/16/2017    5:34 PM, 12/06/20 Josue Hector PT DPT  Physical Therapist with [facility]  [phone] [place] [address] [place], [place], [address] Phone: [phone]   Fax:  [phone]  Name: BRENNYN HAISLEY MRN: [id number] Date of Birth: 23-Dec-1950

## 2020-12-11 ENCOUNTER — Other Ambulatory Visit: Payer: Self-pay

## 2020-12-11 ENCOUNTER — Encounter (HOSPITAL_COMMUNITY): Payer: Self-pay

## 2020-12-11 ENCOUNTER — Ambulatory Visit (HOSPITAL_COMMUNITY): Payer: Medicare HMO

## 2020-12-11 DIAGNOSIS — M25611 Stiffness of right shoulder, not elsewhere classified: Secondary | ICD-10-CM | POA: Diagnosis not present

## 2020-12-11 DIAGNOSIS — R29898 Other symptoms and signs involving the musculoskeletal system: Secondary | ICD-10-CM | POA: Diagnosis not present

## 2020-12-11 DIAGNOSIS — M6281 Muscle weakness (generalized): Secondary | ICD-10-CM

## 2020-12-11 NOTE — Therapy (Signed)
[place], [place], [address] Phone: [phone]   Fax:  [phone]  Physical Therapy Treatment  Patient Details  Name: Jonathan Ochoa MRN: [id number] Date of Birth: Jan 07, 1951 Referring Provider (PT): Justice Britain, MD   Encounter Date: 12/11/2020   PT End of Session - 12/11/20 1056    Visit Number 7    Number of Visits 12    Date for PT Re-Evaluation 12/31/20    Authorization Type [employer] HMO    PT Start Time 1048    PT Stop Time 1130    PT Time Calculation (min) 42 min    Activity Tolerance Patient tolerated treatment well    Behavior During Therapy [facility] for tasks assessed/performed           Past Medical History:  Diagnosis Date  . Arthritis   . Hx of adenomatous polyp of colon 09/14/2018  . Hypothyroidism   . Thyroid disease     Past Surgical History:  Procedure Laterality Date  . adhesions of abdomen     from scar tissue from appendectomy  . AMPUTATION OF REPLICATED TOES     partial tip amputation of left big toe, and next toe   . APPENDECTOMY  1977  . arthroscpy      left knee -meniscus tear  . COLONOSCOPY    . REVERSE SHOULDER ARTHROPLASTY Right 11/01/2020   Procedure: REVERSE SHOULDER ARTHROPLASTY;  Surgeon: Justice Britain, MD;  Location: [facility];  Service: Orthopedics;  Laterality: Right;  128min  . STERIOD INJECTION Right 11/16/2017   Procedure: CORTISONE  INJECTION RIGHT KNEE;  Surgeon: [name], MD;  Location: [facility];  Service: Orthopedics;  Laterality: Right;  . TOE SURGERY    . TOTAL KNEE ARTHROPLASTY Left 11/16/2017   Procedure: LEFT TOTAL KNEE ARTHROPLASTY;  Surgeon: [name], MD;  Location: [facility];  Service: Orthopedics;  Laterality: Left;  . TOTAL KNEE ARTHROPLASTY Right 04/05/2018   Procedure: RIGHT TOTAL KNEE ARTHROPLASTY;  Surgeon: [name], MD;  [facility];  Service: Orthopedics;  Laterality: Right;    There were no vitals filed for this  visit.   Subjective Assessment - 12/11/20 1053    Subjective Shoulder is feeling okay, no reports of pain.  Fingers still stiff today, has arthritis    Currently in Pain? No/denies                             [facility] Adult PT Treatment/Exercise - 12/11/20 0001      Shoulder Exercises: Supine   Other Supine Exercises supine AAROM: flexion, ER, abduction. 3x10      Shoulder Exercises: Seated   Other Seated Exercises bicep curls 2x 10 1# then 2#      Shoulder Exercises: Pulleys   Flexion 2 minutes    Flexion Limitations standing, cueing for posture and reduce compensation    Scaption 2 minutes    Scaption Limitations standing, cueing for posture and reduce compensation      Manual Therapy   Manual Therapy Passive ROM    Manual therapy comments completed seperately from all other skilled interventions    Passive ROM RT GHJ PROM in all planes per protocol and to tolerance                    PT Short Term Goals - 11/26/20 1100      PT SHORT TERM GOAL #1  Title Patient will demo independence in HEP to improve functional outcomes    Time 3    Period Weeks    Status On-going    Target Date 12/10/20      PT SHORT TERM GOAL #2   Title Patient will meet all stage 1 rehab protocol criteria to progress to stage 2    Time 3    Period Weeks    Status On-going    Target Date 12/10/20             PT Long Term Goals - 11/19/20 0852      PT LONG TERM GOAL #1   Title Pt will be independent in advanced HEP for self-progression under protocol    Time 6    Period Weeks    Status New    Target Date 12/31/20      PT LONG TERM GOAL #2   Title PAtient will meet FOTO objectives to improve functional outcomes    Baseline 38.15% function    Time 6    Period Weeks    Status New    Target Date 12/31/20      PT LONG TERM GOAL #3   Title Patient will demo right shoulder flexion to 90 degrees in standing to improve functional independence    Baseline DNT     Time 6    Period Weeks    Status New    Target Date 12/31/20                 Plan - 12/11/20 1116    Clinical Impression Statement Pt 2 days short of week 6 post-op, continued session based on protocol.  Added pullies for AAROM.  Cueing to improve AAROM with PVC pipe as pt has tendency to complete with AROM.  Given putty for grip strengthening to add to HEP with printout instructions included.    Personal Factors and Comorbidities Time since onset of injury/illness/exacerbation    Examination-Activity Limitations Bathing;Carry;Hygiene/Grooming;Toileting;Reach Overhead    Examination-Participation Restrictions Cleaning;Community Activity;Driving;Yard Work;Occupation;Meal Prep    Stability/Clinical Decision Making Stable/Uncomplicated    Clinical Decision Making Low    Rehab Potential Excellent    PT Frequency 2x / week    PT Duration 6 weeks    PT Treatment/Interventions ADLs/Self Care Home Management;Aquatic Therapy;Biofeedback;Cryotherapy;Electrical Stimulation;Moist Heat;Gait training;Stair training;Functional mobility training;Therapeutic activities;Therapeutic exercise;Balance training;Patient/family education;Neuromuscular re-education;Manual techniques;Passive range of motion    PT Next Visit Plan continue with reverse TSR protocol, stage 2. AAROM-AROM in scapular plane, deltoid isometrics    PT Home Exercise Plan supine AAROM shoulder ROM    Consulted and Agree with Plan of Care Patient           Patient will benefit from skilled therapeutic intervention in order to improve the following deficits and impairments:  Decreased activity tolerance,Decreased knowledge of precautions,Decreased range of motion,Decreased strength,Impaired UE functional use,Pain  Visit Diagnosis: Decreased range of motion of right shoulder  Muscle weakness (generalized)  Other symptoms and signs involving the musculoskeletal system     Problem List Patient Active Problem List    Diagnosis Date Noted  . Hx of adenomatous polyp of colon 09/14/2018  . OA (osteoarthritis) of knee 11/16/2017   Ihor Austin, LPTA/CLT; CBIS [phone]  Aldona Lento 12/11/2020, 1:26 PM  [place] [address] [place], [place], [address] Phone: [phone]   Fax:  [phone]  Name: Jonathan Ochoa MRN: [id number] Date of Birth: 05-17-1951

## 2020-12-11 NOTE — Patient Instructions (Signed)
Home Exercises Program Theraputty Exercises  Do the following exercises 2 times a day using your affected hand.  1. Roll putty into a ball.  2. Make into a pancake.  3. Roll putty into a roll.  4. Pinch along log with first finger and thumb.   5. Make into a ball.  6. Roll it back into a log.   7. Pinch using thumb and side of first finger.  8. Roll into a ball, then flatten into a pancake.  9. Using your fingers, make putty into a mountain.   

## 2020-12-13 ENCOUNTER — Other Ambulatory Visit: Payer: Self-pay

## 2020-12-13 ENCOUNTER — Encounter (HOSPITAL_COMMUNITY): Payer: Self-pay

## 2020-12-13 ENCOUNTER — Ambulatory Visit (HOSPITAL_COMMUNITY): Payer: Medicare HMO

## 2020-12-13 DIAGNOSIS — R29898 Other symptoms and signs involving the musculoskeletal system: Secondary | ICD-10-CM

## 2020-12-13 DIAGNOSIS — M25611 Stiffness of right shoulder, not elsewhere classified: Secondary | ICD-10-CM

## 2020-12-13 DIAGNOSIS — M6281 Muscle weakness (generalized): Secondary | ICD-10-CM

## 2020-12-13 NOTE — Therapy (Signed)
[facility] [address] [place], [place], [address] Phone: [phone]   Fax:  [phone]  Physical Therapy Treatment  Patient Details  Name: Jonathan Ochoa MRN: [id number] Date of Birth: 1951/04/21 Referring Provider (PT): Francena Hanly, MD   Encounter Date: 12/13/2020   PT End of Session - 12/13/20 1139     Visit Number 8    Number of Visits 12    Date for PT Re-Evaluation 12/31/20    Authorization Type [employer] Medicare HMO    PT Start Time 1134    PT Stop Time 1212    PT Time Calculation (min) 38 min    Activity Tolerance Patient tolerated treatment well    Behavior During Therapy [facility] for tasks assessed/performed             Past Medical History:  Diagnosis Date   Arthritis    Hx of adenomatous polyp of colon 09/14/2018   Hypothyroidism    Thyroid disease     Past Surgical History:  Procedure Laterality Date   adhesions of abdomen     from scar tissue from appendectomy   AMPUTATION OF REPLICATED TOES     partial tip amputation of left big toe, and next toe    APPENDECTOMY  1977   arthroscpy      left knee -meniscus tear   COLONOSCOPY     REVERSE SHOULDER ARTHROPLASTY Right 11/01/2020   Procedure: REVERSE SHOULDER ARTHROPLASTY;  Surgeon: Francena Hanly, MD;  [facility];  Service: Orthopedics;  Laterality: Right;    STERIOD INJECTION Right 11/16/2017   Procedure: CORTISONE  INJECTION RIGHT KNEE;  Surgeon: Ollen Gross, MD;  Location: [facility];  Service: Orthopedics;  Laterality: Right;   TOE SURGERY     TOTAL KNEE ARTHROPLASTY Left 11/16/2017   Procedure: LEFT TOTAL KNEE ARTHROPLASTY;  Surgeon: Ollen Gross, MD;  [facility];  Service: Orthopedics;  Laterality: Left;   TOTAL KNEE ARTHROPLASTY Right 04/05/2018   Procedure: RIGHT TOTAL KNEE ARTHROPLASTY;  Surgeon: Ollen Gross, MD;  [facility];  Service: Orthopedics;  Laterality: Right;    There were no vitals filed for this visit.    Subjective Assessment - 12/13/20 1138     Subjective Feeling good today, has began new putty.  No reoprts of pain today, goes back to MD tomorrow hoping he'll get out of sling.    Currently in Pain? No/denies                [facility] PT Assessment - 12/13/20 0001       Assessment   Medical Diagnosis Rt artificial shoulder joint    Referring Provider (PT) Francena Hanly, MD    Onset Date/Surgical Date 11/01/20    Hand Dominance Right    Next MD Visit 12/14/20      PROM   Right Shoulder Flexion 107 Degrees   was 60   Right Shoulder ABduction 87 Degrees   was 45   Right Shoulder External Rotation 60 Degrees   was 20                          [facility] Adult PT Treatment/Exercise - 12/13/20 0001       Shoulder Exercises: Supine   Other Supine Exercises supine AAROM: flexion, ER, abduction. 3x10      Shoulder Exercises: Seated   Retraction 10 reps   2 sets   Theraband Level (Shoulder Retraction) Level 2 (Red)  Other Seated Exercises bicep curls 2x 10 2#      Shoulder Exercises: Pulleys   Flexion 2 minutes    Flexion Limitations standing, cueing for posture and reduce compensation    Scaption 2 minutes    Scaption Limitations standing, cueing for posture and reduce compensation      Manual Therapy   Manual Therapy Passive ROM    Manual therapy comments completed seperately from all other skilled interventions    Passive ROM RT GHJ PROM in all planes per protocol and to tolerance                      PT Short Term Goals - 12/13/20 1139       PT SHORT TERM GOAL #1   Title Patient will demo independence in HEP to improve functional outcomes    Baseline 12/13/20:  Reports HEP 2-3 times a day      PT SHORT TERM GOAL #2   Title Patient will meet all stage 1 rehab protocol criteria to progress to stage 2    Baseline 12/13/20:  Has been wearing shoulder sling day and night, compliant with HEP, reports minimal edema, see ROM    Status On-going                PT Long Term Goals - 11/19/20 1610       PT LONG TERM GOAL #1   Title Pt will be independent in advanced HEP for self-progression under protocol    Time 6    Period Weeks    Status New    Target Date 12/31/20      PT LONG TERM GOAL #2   Title PAtient will meet FOTO objectives to improve functional outcomes    Baseline 38.15% function    Time 6    Period Weeks    Status New    Target Date 12/31/20      PT LONG TERM GOAL #3   Title Patient will demo right shoulder flexion to 90 degrees in standing to improve functional independence    Baseline DNT    Time 6    Period Weeks    Status New    Target Date 12/31/20                   Plan - 12/13/20 1156     Clinical Impression Statement Pt 6 week post-op and MD apt scheduled for tomorrow.  Reviewed STGs and is progressing well.  Pt has met all requirements for Phase 1 and PROM progressing well. Session focus on shoulder mobility and strengthening per protocol.  Pt tolerated well with no reports of pain, was limited by fatigue with reps.    Personal Factors and Comorbidities Time since onset of injury/illness/exacerbation    Examination-Activity Limitations Bathing;Carry;Hygiene/Grooming;Toileting;Reach Overhead    Examination-Participation Restrictions Cleaning;Community Activity;Driving;Yard Work;Occupation;Meal Prep    Stability/Clinical Decision Making Stable/Uncomplicated    Clinical Decision Making Low    Rehab Potential Excellent    PT Frequency 2x / week    PT Duration 6 weeks    PT Treatment/Interventions ADLs/Self Care Home Management;Aquatic Therapy;Biofeedback;Cryotherapy;Electrical Stimulation;Moist Heat;Gait training;Stair training;Functional mobility training;Therapeutic activities;Therapeutic exercise;Balance training;Patient/family education;Neuromuscular re-education;Manual techniques;Passive range of motion    PT Next Visit Plan continue with reverse TSR protocol, stage 2. AAROM-AROM in  scapular plane, deltoid isometrics    PT Home Exercise Plan supine AAROM shoulder ROM    Consulted and Agree with Plan of Care Patient  Patient will benefit from skilled therapeutic intervention in order to improve the following deficits and impairments:  Decreased activity tolerance, Decreased knowledge of precautions, Decreased range of motion, Decreased strength, Impaired UE functional use, Pain  Visit Diagnosis: Decreased range of motion of right shoulder  Other symptoms and signs involving the musculoskeletal system  Muscle weakness (generalized)     Problem List Patient Active Problem List   Diagnosis Date Noted   Hx of adenomatous polyp of colon 09/14/2018   OA (osteoarthritis) of knee 11/16/2017   Ihor Austin, LPTA/CLT; CBIS [phone]  Aldona Lento 12/13/2020, 12:15 PM  [place], [place], [address] Phone: [phone]   Fax:  [phone]  Name: Jonathan Ochoa MRN: [id number] Date of Birth: July 19, 1950

## 2020-12-18 ENCOUNTER — Encounter (HOSPITAL_COMMUNITY): Payer: Self-pay | Admitting: Physical Therapy

## 2020-12-18 ENCOUNTER — Other Ambulatory Visit: Payer: Self-pay

## 2020-12-18 ENCOUNTER — Ambulatory Visit (HOSPITAL_COMMUNITY): Payer: Medicare HMO | Admitting: Physical Therapy

## 2020-12-18 DIAGNOSIS — M25611 Stiffness of right shoulder, not elsewhere classified: Secondary | ICD-10-CM | POA: Diagnosis not present

## 2020-12-18 DIAGNOSIS — M6281 Muscle weakness (generalized): Secondary | ICD-10-CM

## 2020-12-18 DIAGNOSIS — R29898 Other symptoms and signs involving the musculoskeletal system: Secondary | ICD-10-CM | POA: Diagnosis not present

## 2020-12-18 NOTE — Therapy (Signed)
[place], [place], [address] Phone: [phone]   Fax:  [phone]  Physical Therapy Treatment  Patient Details  Name: Jonathan Ochoa MRN: [id number] Date of Birth: May 15, 1951 Referring Provider (PT): Justice Britain, MD   Encounter Date: 12/18/2020   PT End of Session - 12/18/20 1038     Visit Number 9    Number of Visits 12    Date for PT Re-Evaluation 12/31/20    Authorization Type [employer] Medicare HMO    PT Start Time 1035    PT Stop Time 1118    PT Time Calculation (min) 43 min    Activity Tolerance Patient tolerated treatment well    Behavior During Therapy [facility] for tasks assessed/performed             Past Medical History:  Diagnosis Date   Arthritis    Hx of adenomatous polyp of colon 09/14/2018   Hypothyroidism    Thyroid disease     Past Surgical History:  Procedure Laterality Date   adhesions of abdomen     from scar tissue from appendectomy   AMPUTATION OF REPLICATED TOES     partial tip amputation of left big toe, and next toe    APPENDECTOMY  1977   arthroscpy      left knee -meniscus tear   COLONOSCOPY     REVERSE SHOULDER ARTHROPLASTY Right 11/01/2020   Procedure: REVERSE SHOULDER ARTHROPLASTY;  Surgeon: Justice Britain, MD;  Location: [facility];  Service: Orthopedics;  Laterality: Right;  152min   STERIOD INJECTION Right 11/16/2017   Procedure: CORTISONE  INJECTION RIGHT KNEE;  Surgeon: [name], MD;  Location: [facility];  Service: Orthopedics;  Laterality: Right;   TOE SURGERY     TOTAL KNEE ARTHROPLASTY Left 11/16/2017   Procedure: LEFT TOTAL KNEE ARTHROPLASTY;  Surgeon: [name], MD;  [facility];  Service: Orthopedics;  Laterality: Left;   TOTAL KNEE ARTHROPLASTY Right 04/05/2018   Procedure: RIGHT TOTAL KNEE ARTHROPLASTY;  Surgeon: [name], MD;  [facility];  Service: Orthopedics;  Laterality: Right;    There were no vitals filed for this visit.    Subjective Assessment - 12/18/20 1038     Subjective Patient had recent follow up with ortho, says things are going well. He was taken out of sling. No pain currently, things are going well.    Currently in Pain? No/denies                               [facility] Adult PT Treatment/Exercise - 12/18/20 0001       Shoulder Exercises: Supine   Other Supine Exercises supine AAROM: flexion, ER, abduction. x15      Shoulder Exercises: Seated   Other Seated Exercises pulleys flexion 15 x 5"      Shoulder Exercises: Sidelying   External Rotation AROM;Right;15 reps      Shoulder Exercises: Standing   Other Standing Exercises wall slides 10 x 5"    Other Standing Exercises shoulder isometrics flex/abd/ ext/ IR ER 10 x 5" each      Manual Therapy   Manual Therapy Passive ROM    Manual therapy comments completed seperately from all other skilled interventions    Passive ROM RT GHJ PROM in all planes per protocol and to tolerance  PT Short Term Goals - 12/13/20 1139       PT SHORT TERM GOAL #1   Title Patient will demo independence in HEP to improve functional outcomes    Baseline 12/13/20:  Reports HEP 2-3 times a day      PT SHORT TERM GOAL #2   Title Patient will meet all stage 1 rehab protocol criteria to progress to stage 2    Baseline 12/13/20:  Has been wearing shoulder sling day and night, compliant with HEP, reports minimal edema, see ROM    Status On-going               PT Long Term Goals - 11/19/20 2706       PT LONG TERM GOAL #1   Title Pt will be independent in advanced HEP for self-progression under protocol    Time 6    Period Weeks    Status New    Target Date 12/31/20      PT LONG TERM GOAL #2   Title PAtient will meet FOTO objectives to improve functional outcomes    Baseline 38.15% function    Time 6    Period Weeks    Status New    Target Date 12/31/20      PT LONG TERM GOAL #3   Title Patient will  demo right shoulder flexion to 90 degrees in standing to improve functional independence    Baseline DNT    Time 6    Period Weeks    Status New    Target Date 12/31/20                   Plan - 12/18/20 1203     Clinical Impression Statement Patient tolerated session well today. Progressed shoulder AAROM stretching with added wall slides. Also, progress shoulder isometrics. Patient educated on purpose and function of added activity. Patient required verbal cues and demo for proper form with shoulder ER isometrics. Issued updated HEP Handout. Patient will continue to benefit from skilled therapy services to reduce deficits and improve functional ability.    Personal Factors and Comorbidities Time since onset of injury/illness/exacerbation    Examination-Activity Limitations Bathing;Carry;Hygiene/Grooming;Toileting;Reach Overhead    Examination-Participation Restrictions Cleaning;Community Activity;Driving;Yard Work;Occupation;Meal Prep    Stability/Clinical Decision Making Stable/Uncomplicated    Rehab Potential Excellent    PT Frequency 2x / week    PT Duration 6 weeks    PT Treatment/Interventions ADLs/Self Care Home Management;Aquatic Therapy;Biofeedback;Cryotherapy;Electrical Stimulation;Moist Heat;Gait training;Stair training;Functional mobility training;Therapeutic activities;Therapeutic exercise;Balance training;Patient/family education;Neuromuscular re-education;Manual techniques;Passive range of motion    PT Next Visit Plan continue with reverse TSR protocol, stage 2. AAROM-AROM in scapular plane, deltoid isometrics    PT Home Exercise Plan supine AAROM shoulder ROM 6/14 wall slides, shoulder isometrics    Consulted and Agree with Plan of Care Patient             Patient will benefit from skilled therapeutic intervention in order to improve the following deficits and impairments:  Decreased activity tolerance, Decreased knowledge of precautions, Decreased range of  motion, Decreased strength, Impaired UE functional use, Pain  Visit Diagnosis: Decreased range of motion of right shoulder  Other symptoms and signs involving the musculoskeletal system  Muscle weakness (generalized)     Problem List Patient Active Problem List   Diagnosis Date Noted   Hx of adenomatous polyp of colon 09/14/2018   OA (osteoarthritis) of knee 11/16/2017   12:07 PM, 12/18/20 Josue Hector PT DPT  Physical Therapist with [facility]  (336) [phone] [place] [address] [place], [place], [address] Phone: [phone]   Fax:  [phone]  Name: Jonathan Ochoa MRN: [id number] Date of Birth: 1950/12/13

## 2020-12-18 NOTE — Patient Instructions (Signed)
Access Code: [email or web address] URL: [email or web address] Date: 12/18/2020 Prepared by: Josue Hector  Exercises Shoulder Flexion Wall Slide with Towel - 2 x daily - 7 x weekly - 2 sets - 10 reps - 5 second hold Standing Isometric Shoulder Flexion with Doorway - Arm Bent - 2 x daily - 7 x weekly - 1 sets - 10 reps - 5 second hold Standing Isometric Shoulder Extension with Doorway - Arm Bent - 2 x daily - 7 x weekly - 1 sets - 10 reps - 5 second hold Standing Isometric Shoulder Abduction with Doorway - Arm Bent - 2 x daily - 7 x weekly - 1 sets - 10 reps - 5 second hold Standing Isometric Shoulder External Rotation with Doorway and Towel Roll - 2 x daily - 7 x weekly - 1 sets - 10 reps - 5 second hold Standing Isometric Shoulder Internal Rotation with Towel Roll at Doorway - 2 x daily - 7 x weekly - 1 sets - 10 reps - 5 second hold

## 2020-12-20 ENCOUNTER — Ambulatory Visit (HOSPITAL_COMMUNITY): Payer: Medicare HMO | Admitting: Physical Therapy

## 2020-12-20 ENCOUNTER — Encounter (HOSPITAL_COMMUNITY): Payer: Self-pay | Admitting: Physical Therapy

## 2020-12-20 ENCOUNTER — Other Ambulatory Visit: Payer: Self-pay

## 2020-12-20 DIAGNOSIS — M6281 Muscle weakness (generalized): Secondary | ICD-10-CM | POA: Diagnosis not present

## 2020-12-20 DIAGNOSIS — R29898 Other symptoms and signs involving the musculoskeletal system: Secondary | ICD-10-CM | POA: Diagnosis not present

## 2020-12-20 DIAGNOSIS — M25611 Stiffness of right shoulder, not elsewhere classified: Secondary | ICD-10-CM | POA: Diagnosis not present

## 2020-12-20 NOTE — Therapy (Signed)
[facility] [address] [place], [place], [address] Phone: [phone]   Fax:  [phone]  Physical Therapy Treatment  Patient Details  Name: Jonathan Ochoa MRN: [id number] Date of Birth: 06-28-1951 Referring Provider (PT): Justice Britain, MD  Progress Note Reporting Period 11/19/20 to 12/20/20  See note below for Objective Data and Assessment of Progress/Goals.     Encounter Date: 12/20/2020   PT End of Session - 12/20/20 1042     Visit Number 10    Number of Visits 18    Date for PT Re-Evaluation 01/17/21    Authorization Type [employer] HMO    PT Start Time 1035    PT Stop Time 1115    PT Time Calculation (min) 40 min    Activity Tolerance Patient tolerated treatment well    Behavior During Therapy [facility] for tasks assessed/performed             Past Medical History:  Diagnosis Date   Arthritis    Hx of adenomatous polyp of colon 09/14/2018   Hypothyroidism    Thyroid disease     Past Surgical History:  Procedure Laterality Date   adhesions of abdomen     from scar tissue from appendectomy   AMPUTATION OF REPLICATED TOES     partial tip amputation of left big toe, and next toe    APPENDECTOMY  1977   arthroscpy      left knee -meniscus tear   COLONOSCOPY     REVERSE SHOULDER ARTHROPLASTY Right 11/01/2020   Procedure: REVERSE SHOULDER ARTHROPLASTY;  Surgeon: Justice Britain, MD;  Location: [facility];  Service: Orthopedics;  Laterality: Right;  171min   STERIOD INJECTION Right 11/16/2017   Procedure: CORTISONE  INJECTION RIGHT KNEE;  Surgeon: [name], MD;  Location: [facility];  Service: Orthopedics;  Laterality: Right;   TOE SURGERY     TOTAL KNEE ARTHROPLASTY Left 11/16/2017   Procedure: LEFT TOTAL KNEE ARTHROPLASTY;  Surgeon: [name], MD;  Location: [facility];  Service: Orthopedics;  Laterality: Left;   TOTAL KNEE ARTHROPLASTY Right 04/05/2018   Procedure: RIGHT TOTAL KNEE ARTHROPLASTY;  Surgeon: [name], MD;  Location: [facility];  Service: Orthopedics;  Laterality: Right;    There were no vitals filed for this visit.   Subjective Assessment - 12/20/20 1040     Subjective Patient reports no new issues. HEP going well. Still has some soreness in hand occasionally.    Currently in Pain? No/denies                [facility] PT Assessment - 12/20/20 0001       Assessment   Medical Diagnosis Rt artificial shoulder joint    Referring Provider (PT) Justice Britain, MD    Onset Date/Surgical Date 11/01/20    Hand Dominance Right    Next MD Visit 12/29/20      Precautions   Precautions Shoulder      Prior Function   Level of Independence Independent      Observation/Other Assessments   Focus on Therapeutic Outcomes (FOTO)  49% function   was 38%     ROM / Strength   AROM / PROM / Strength AROM      AROM   AROM Assessment Site Shoulder    Right/Left Shoulder Right;Left    Right Shoulder Flexion 113 Degrees    Right Shoulder ABduction 104 Degrees    Right Shoulder Internal Rotation --   sacrum   Right  Shoulder External Rotation --   occiput     PROM   Right Shoulder Flexion 130 Degrees    Right Shoulder ABduction 125 Degrees    Right Shoulder External Rotation 38 Degrees                           [facility] Adult PT Treatment/Exercise - 12/20/20 0001       Shoulder Exercises: Standing   Other Standing Exercises wall slides 10 x 5", 2# biceps curl x20    Other Standing Exercises shoulder isometrics flex/abd/ ext/ IR ER 10 x 5" each      Manual Therapy   Manual Therapy Passive ROM    Manual therapy comments completed seperately from all other skilled interventions    Passive ROM RT GHJ PROM in all planes per protocol and to tolerance                      PT Short Term Goals - 12/20/20 1105       PT SHORT TERM GOAL #1   Title Patient will demo independence in HEP to improve functional outcomes    Baseline Reports compliance and demos good  return    Status Achieved      PT SHORT TERM GOAL #2   Title Patient will meet all stage 1 rehab protocol criteria to progress to stage 2    Baseline See protocol    Status Achieved               PT Long Term Goals - 12/20/20 1106       PT LONG TERM GOAL #1   Title Pt will be independent in advanced HEP for self-progression under protocol    Time 6    Period Weeks    Status On-going      PT LONG TERM GOAL #2   Title PAtient will meet FOTO objectives to improve functional outcomes    Baseline current 49% function    Time 6    Period Weeks    Status On-going      PT LONG TERM GOAL #3   Title Patient will demo right shoulder flexion to 90 degrees in standing to improve functional independence    Baseline Current 113 AROM    Time 6    Period Weeks    Status Achieved                   Plan - 12/20/20 1110     Clinical Impression Statement Patient is progressing well toward therapy goals. Patient current in phase II of surgical protocol. Patient showing good PROM and is progressing pain free AROM well. Patient has good tolerance with all activity and shoulder isometrics thus far and reports no pain with current daily function within confines of current protocol phase. Patient will continue to benefit from skilled therapy services to progress shoulder strength and mobility per post-operative protocol for improved functional ability.    Personal Factors and Comorbidities Time since onset of injury/illness/exacerbation    Examination-Activity Limitations Bathing;Carry;Hygiene/Grooming;Toileting;Reach Overhead    Examination-Participation Restrictions Cleaning;Community Activity;Driving;Yard Work;Occupation;Meal Prep    Stability/Clinical Decision Making Stable/Uncomplicated    Rehab Potential Excellent    PT Frequency 2x / week    PT Duration 4 weeks    PT Treatment/Interventions ADLs/Self Care Home Management;Aquatic Therapy;Biofeedback;Cryotherapy;Electrical  Stimulation;Moist Heat;Gait training;Stair training;Functional mobility training;Therapeutic activities;Therapeutic exercise;Balance training;Patient/family education;Neuromuscular re-education;Manual techniques;Passive range of motion    PT  Next Visit Plan continue with reverse TSR protocol, stage 2. AAROM-AROM in scapular plane, deltoid isometrics    PT Home Exercise Plan supine AAROM shoulder ROM 6/14 wall slides, shoulder isometrics    Consulted and Agree with Plan of Care Patient             Patient will benefit from skilled therapeutic intervention in order to improve the following deficits and impairments:  Decreased activity tolerance, Decreased knowledge of precautions, Decreased range of motion, Decreased strength, Impaired UE functional use, Pain  Visit Diagnosis: Decreased range of motion of right shoulder  Other symptoms and signs involving the musculoskeletal system  Muscle weakness (generalized)     Problem List Patient Active Problem List   Diagnosis Date Noted   Hx of adenomatous polyp of colon 09/14/2018   OA (osteoarthritis) of knee 11/16/2017   11:16 AM, 12/20/20 Josue Hector PT DPT  Physical Therapist with [facility]  (336) [phone] [place], [place], [address] Phone: [phone]   Fax:  [phone]  Name: Jonathan Ochoa MRN: [id number] Date of Birth: 30-Sep-1950

## 2020-12-24 ENCOUNTER — Encounter (HOSPITAL_COMMUNITY): Payer: Self-pay

## 2020-12-24 ENCOUNTER — Other Ambulatory Visit: Payer: Self-pay

## 2020-12-24 ENCOUNTER — Ambulatory Visit (HOSPITAL_COMMUNITY): Payer: Medicare HMO

## 2020-12-24 DIAGNOSIS — M6281 Muscle weakness (generalized): Secondary | ICD-10-CM

## 2020-12-24 DIAGNOSIS — R29898 Other symptoms and signs involving the musculoskeletal system: Secondary | ICD-10-CM

## 2020-12-24 DIAGNOSIS — M25611 Stiffness of right shoulder, not elsewhere classified: Secondary | ICD-10-CM | POA: Diagnosis not present

## 2020-12-24 NOTE — Therapy (Signed)
[place], [place], [address] Phone: [phone]   Fax:  [phone]  Physical Therapy Treatment  Patient Details  Name: Jonathan Ochoa MRN: [id number] Date of Birth: 02-Jul-1951 Referring Provider (PT): Justice Britain, MD   Encounter Date: 12/24/2020   PT End of Session - 12/24/20 1659     Visit Number 11    Number of Visits 18    Date for PT Re-Evaluation 01/17/21    Authorization Type [employer] Medicare HMO    PT Start Time 7062    PT Stop Time 3762    PT Time Calculation (min) 45 min    Activity Tolerance Patient tolerated treatment well    Behavior During Therapy [facility] for tasks assessed/performed             Past Medical History:  Diagnosis Date   Arthritis    Hx of adenomatous polyp of colon 09/14/2018   Hypothyroidism    Thyroid disease     Past Surgical History:  Procedure Laterality Date   adhesions of abdomen     from scar tissue from appendectomy   AMPUTATION OF REPLICATED TOES     partial tip amputation of left big toe, and next toe    APPENDECTOMY  1977   arthroscpy      left knee -meniscus tear   COLONOSCOPY     REVERSE SHOULDER ARTHROPLASTY Right 11/01/2020   Procedure: REVERSE SHOULDER ARTHROPLASTY;  Surgeon: Justice Britain, MD;  Location: [facility];  Service: Orthopedics;  Laterality: Right;  169min   STERIOD INJECTION Right 11/16/2017   Procedure: CORTISONE  INJECTION RIGHT KNEE;  Surgeon: [name], MD;  Location: [facility];  Service: Orthopedics;  Laterality: Right;   TOE SURGERY     TOTAL KNEE ARTHROPLASTY Left 11/16/2017   Procedure: LEFT TOTAL KNEE ARTHROPLASTY;  Surgeon: [name], MD;  [facility]: [facility];  Service: Orthopedics;  Laterality: Left;   TOTAL KNEE ARTHROPLASTY Right 04/05/2018   Procedure: RIGHT TOTAL KNEE ARTHROPLASTY;  Surgeon: [name], MD;  [facility]: [facility];  Service: Orthopedics;  Laterality: Right;    There were no vitals filed for this visit.    Subjective Assessment - 12/24/20 1750     Subjective Feeling better and better. Arm just feels weak    Currently in Pain? No/denies    Pain Score 0-No pain                [facility] PT Assessment - 12/24/20 0001       Assessment   Medical Diagnosis Rt artificial shoulder joint    Referring Provider (PT) Justice Britain, MD    Onset Date/Surgical Date 11/01/20    Hand Dominance Right    Next MD Visit 01/28/21      Precautions   Precautions Shoulder      AROM   AROM Assessment Site Shoulder    Right/Left Shoulder Right      PROM   Right Shoulder Flexion 140 Degrees    Right Shoulder ABduction 125 Degrees    Right Shoulder External Rotation 42 Degrees                           [facility] Adult PT Treatment/Exercise - 12/24/20 0001       Shoulder Exercises: Standing   External Rotation Strengthening;Right;15 reps    Theraband Level (Shoulder External Rotation) Level 2 (Red)    External Rotation Weight (lbs) dyanamic isometric  Internal Rotation Strengthening;Right;15 reps    Theraband Level (Shoulder Internal Rotation) Level 2 (Red)    Internal Rotation Weight (lbs) dyanamic isometric    Row Strengthening;Right;20 reps;Theraband    Theraband Level (Shoulder Row) Level 2 (Red)    Row Limitations to neutral, limited extension    Other Standing Exercises wall slides 3x10      Shoulder Exercises: Pulleys   Flexion 2 minutes      Manual Therapy   Manual Therapy Passive ROM    Manual therapy comments completed seperately from all other skilled interventions    Passive ROM RT GHJ PROM in all planes per protocol and to tolerance                    PT Education - 12/24/20 1751     Education Details education on stage 2 for protocl    Person(s) Educated Patient    Methods Explanation;Demonstration    Comprehension Verbalized understanding              PT Short Term Goals - 12/20/20 1105       PT SHORT TERM GOAL #1   Title Patient will  demo independence in HEP to improve functional outcomes    Baseline Reports compliance and demos good return    Status Achieved      PT SHORT TERM GOAL #2   Title Patient will meet all stage 1 rehab protocol criteria to progress to stage 2    Baseline See protocol    Status Achieved               PT Long Term Goals - 12/20/20 1106       PT LONG TERM GOAL #1   Title Pt will be independent in advanced HEP for self-progression under protocol    Time 6    Period Weeks    Status On-going      PT LONG TERM GOAL #2   Title PAtient will meet FOTO objectives to improve functional outcomes    Baseline current 49% function    Time 6    Period Weeks    Status On-going      PT LONG TERM GOAL #3   Title Patient will demo right shoulder flexion to 90 degrees in standing to improve functional independence    Baseline Current 113 AROM    Time 6    Period Weeks    Status Achieved                   Plan - 12/24/20 1751     Clinical Impression Statement Pt tolerating tx sessions well with improving PROM and AROM and now able to initiate strengthening phase. Continued to limit degree of extension and internal rotation per surgical protocol with resistance exercises to neutral extension. Continued sessions indicated to progress into phase 2 of protocol and improve RUE AROM, strength, endurance.    Personal Factors and Comorbidities Time since onset of injury/illness/exacerbation    Examination-Activity Limitations Bathing;Carry;Hygiene/Grooming;Toileting;Reach Overhead    Examination-Participation Restrictions Cleaning;Community Activity;Driving;Yard Work;Occupation;Meal Prep    Stability/Clinical Decision Making Stable/Uncomplicated    Rehab Potential Excellent    PT Frequency 2x / week    PT Duration 4 weeks    PT Treatment/Interventions ADLs/Self Care Home Management;Aquatic Therapy;Biofeedback;Cryotherapy;Electrical Stimulation;Moist Heat;Gait training;Stair  training;Functional mobility training;Therapeutic activities;Therapeutic exercise;Balance training;Patient/family education;Neuromuscular re-education;Manual techniques;Passive range of motion    PT Next Visit Plan continue with reverse TSR protocol, stage 2. AAROM-AROM in scapular plane, deltoid  isometrics    PT Home Exercise Plan supine AAROM shoulder ROM 6/14 wall slides, shoulder isometrics    Consulted and Agree with Plan of Care Patient             Patient will benefit from skilled therapeutic intervention in order to improve the following deficits and impairments:  Decreased activity tolerance, Decreased knowledge of precautions, Decreased range of motion, Decreased strength, Impaired UE functional use, Pain  Visit Diagnosis: Decreased range of motion of right shoulder  Other symptoms and signs involving the musculoskeletal system  Muscle weakness (generalized)     Problem List Patient Active Problem List   Diagnosis Date Noted   Hx of adenomatous polyp of colon 09/14/2018   OA (osteoarthritis) of knee 11/16/2017   6:00 PM, 12/24/20 M. Sherlyn Lees, PT, DPT Physical Therapist- [place] Office Number: [phone]   [facility] [address] [place], [place], [address] Phone: [phone]   Fax:  [phone]  Name: Jonathan Ochoa MRN: [id number] Date of Birth: 19-Nov-1950

## 2020-12-25 ENCOUNTER — Ambulatory Visit (HOSPITAL_COMMUNITY): Payer: Medicare HMO | Admitting: Physical Therapy

## 2020-12-27 ENCOUNTER — Other Ambulatory Visit: Payer: Self-pay

## 2020-12-27 ENCOUNTER — Encounter (HOSPITAL_COMMUNITY): Payer: Self-pay | Admitting: Physical Therapy

## 2020-12-27 ENCOUNTER — Ambulatory Visit (HOSPITAL_COMMUNITY): Payer: Medicare HMO | Admitting: Physical Therapy

## 2020-12-27 DIAGNOSIS — M6281 Muscle weakness (generalized): Secondary | ICD-10-CM

## 2020-12-27 DIAGNOSIS — R29898 Other symptoms and signs involving the musculoskeletal system: Secondary | ICD-10-CM

## 2020-12-27 DIAGNOSIS — M25611 Stiffness of right shoulder, not elsewhere classified: Secondary | ICD-10-CM

## 2020-12-27 NOTE — Therapy (Signed)
[place], [place], [address] Phone: [phone]   Fax:  [phone]  Physical Therapy Treatment  Patient Details  Name: Jonathan Ochoa MRN: [id number] Date of Birth: 1951-01-29 Referring Provider (PT): Justice Britain, MD   Encounter Date: 12/27/2020   PT End of Session - 12/27/20 0950     Visit Number 12    Number of Visits 18    Date for PT Re-Evaluation 01/17/21    Authorization Type [employer] HMO    PT Start Time [phone]    PT Stop Time 1025    PT Time Calculation (min) 38 min    Activity Tolerance Patient tolerated treatment well    Behavior During Therapy [facility] for tasks assessed/performed             Past Medical History:  Diagnosis Date   Arthritis    Hx of adenomatous polyp of colon 09/14/2018   Hypothyroidism    Thyroid disease     Past Surgical History:  Procedure Laterality Date   adhesions of abdomen     from scar tissue from appendectomy   AMPUTATION OF REPLICATED TOES     partial tip amputation of left big toe, and next toe    APPENDECTOMY  1977   arthroscpy      left knee -meniscus tear   COLONOSCOPY     REVERSE SHOULDER ARTHROPLASTY Right 11/01/2020   Procedure: REVERSE SHOULDER ARTHROPLASTY;  Surgeon: Justice Britain, MD;  Location: [facility];  Service: Orthopedics;  Laterality: Right;  193min   STERIOD INJECTION Right 11/16/2017   Procedure: CORTISONE  INJECTION RIGHT KNEE;  Surgeon: [name], MD;  Location: [facility];  Service: Orthopedics;  Laterality: Right;   TOE SURGERY     TOTAL KNEE ARTHROPLASTY Left 11/16/2017   Procedure: LEFT TOTAL KNEE ARTHROPLASTY;  Surgeon: [name], MD;  [facility]: [facility];  Service: Orthopedics;  Laterality: Left;   TOTAL KNEE ARTHROPLASTY Right 04/05/2018   Procedure: RIGHT TOTAL KNEE ARTHROPLASTY;  Surgeon: [name], MD;  Location: [facility];  Service: Orthopedics;  Laterality: Right;    There were no vitals filed for this visit.    Subjective Assessment - 12/27/20 0949     Subjective No new issues, exercises going well.    Currently in Pain? No/denies                               [facility] Adult PT Treatment/Exercise - 12/27/20 0001       Shoulder Exercises: Supine   Protraction Right;20 reps    Other Supine Exercises dowel rod chest press x20      Shoulder Exercises: Standing   External Rotation 20 reps;Theraband    Theraband Level (Shoulder External Rotation) Level 2 (Red)    External Rotation Weight (lbs) dyanamic isometric    Internal Rotation Right;10 reps    Theraband Level (Shoulder Internal Rotation) Level 2 (Red)    Internal Rotation Weight (lbs) dyanamic isometric    Flexion Both;20 reps    Flexion Limitations cues to avoid shoulder hike, in mirror for visual feedback    Extension 20 reps;Theraband    Theraband Level (Shoulder Extension) Level 2 (Red)    Row 20 reps;Theraband    Theraband Level (Shoulder Row) Level 2 (Red)    Other Standing Exercises bicep curls 3# 3 x 10    Other Standing Exercises ball on wall greenmed ball x30 CW/ CCW  Shoulder Exercises: Pulleys   Flexion 2 minutes      Manual Therapy   Manual Therapy Passive ROM    Manual therapy comments completed seperately from all other skilled interventions    Passive ROM RT GHJ PROM in all planes per protocol and to tolerance                      PT Short Term Goals - 12/20/20 1105       PT SHORT TERM GOAL #1   Title Patient will demo independence in HEP to improve functional outcomes    Baseline Reports compliance and demos good return    Status Achieved      PT SHORT TERM GOAL #2   Title Patient will meet all stage 1 rehab protocol criteria to progress to stage 2    Baseline See protocol    Status Achieved               PT Long Term Goals - 12/20/20 1106       PT LONG TERM GOAL #1   Title Pt will be independent in advanced HEP for self-progression under protocol    Time 6     Period Weeks    Status On-going      PT LONG TERM GOAL #2   Title PAtient will meet FOTO objectives to improve functional outcomes    Baseline current 49% function    Time 6    Period Weeks    Status On-going      PT LONG TERM GOAL #3   Title Patient will demo right shoulder flexion to 90 degrees in standing to improve functional independence    Baseline Current 113 AROM    Time 6    Period Weeks    Status Achieved                   Plan - 12/27/20 1019     Clinical Impression Statement Patient progressing well. Progressed scapular strength and stabilization exercise. Patient educate on purpose and function of all added activity. Patient performs all added exercise with no increased complaint of pain. Patient shows improving RT shoulder AROM but demos should hike with forward flexion near 80 degrees. Patient able to improve with verbal cues to avoid this. Patient will continue to benefit from skilled therapy services to progress shoulder function per surgical protocol for improved functional ability.    Personal Factors and Comorbidities Time since onset of injury/illness/exacerbation    Examination-Activity Limitations Bathing;Carry;Hygiene/Grooming;Toileting;Reach Overhead    Examination-Participation Restrictions Cleaning;Community Activity;Driving;Yard Work;Occupation;Meal Prep    Stability/Clinical Decision Making Stable/Uncomplicated    Rehab Potential Excellent    PT Frequency 2x / week    PT Duration 4 weeks    PT Treatment/Interventions ADLs/Self Care Home Management;Aquatic Therapy;Biofeedback;Cryotherapy;Electrical Stimulation;Moist Heat;Gait training;Stair training;Functional mobility training;Therapeutic activities;Therapeutic exercise;Balance training;Patient/family education;Neuromuscular re-education;Manual techniques;Passive range of motion    PT Next Visit Plan continue with reverse TSR protocol, stage 2. AAROM-AROM in scapular plane    PT Home Exercise  Plan supine AAROM shoulder ROM 6/14 wall slides, shoulder isometrics 6/23 supine protraction, shoulder flexion AROM (focus on avoiding shoulder hike)    Consulted and Agree with Plan of Care Patient             Patient will benefit from skilled therapeutic intervention in order to improve the following deficits and impairments:  Decreased activity tolerance, Decreased knowledge of precautions, Decreased range of motion, Decreased strength, Impaired UE functional use,  Pain  Visit Diagnosis: Decreased range of motion of right shoulder  Other symptoms and signs involving the musculoskeletal system  Muscle weakness (generalized)     Problem List Patient Active Problem List   Diagnosis Date Noted   Hx of adenomatous polyp of colon 09/14/2018   OA (osteoarthritis) of knee 11/16/2017   10:27 AM, 12/27/20 Josue Hector PT DPT  Physical Therapist with [facility]  ([phone] [place], [place], [address] Phone: [phone]   Fax:  [phone]  Name: ANTHEM FRAZER MRN: [id number] Date of Birth: February 09, 1951

## 2020-12-27 NOTE — Patient Instructions (Signed)
Access Code: [email or web address] URL: [email or web address] Date: 12/27/2020 Prepared by: Josue Hector  Exercises Supine Bilateral Shoulder Protraction - 2-3 x daily - 7 x weekly - 2 sets - 10 reps Standing Shoulder Flexion to 90 Degrees - 2-3 x daily - 7 x weekly - 2 sets - 10 reps

## 2021-01-01 ENCOUNTER — Other Ambulatory Visit: Payer: Self-pay

## 2021-01-01 ENCOUNTER — Ambulatory Visit (HOSPITAL_COMMUNITY): Payer: Medicare HMO | Admitting: Physical Therapy

## 2021-01-01 ENCOUNTER — Encounter (HOSPITAL_COMMUNITY): Payer: Self-pay | Admitting: Physical Therapy

## 2021-01-01 DIAGNOSIS — M25611 Stiffness of right shoulder, not elsewhere classified: Secondary | ICD-10-CM | POA: Diagnosis not present

## 2021-01-01 DIAGNOSIS — M6281 Muscle weakness (generalized): Secondary | ICD-10-CM | POA: Diagnosis not present

## 2021-01-01 DIAGNOSIS — R29898 Other symptoms and signs involving the musculoskeletal system: Secondary | ICD-10-CM

## 2021-01-01 NOTE — Therapy (Signed)
[place], [place], [address] Phone: [phone]   Fax:  [phone]  Physical Therapy Treatment  Patient Details  Name: Jonathan Ochoa MRN: [id number] Date of Birth: 11-14-1950 Referring Provider (PT): Justice Britain, MD   Encounter Date: 01/01/2021   PT End of Session - 01/01/21 1003     Visit Number 13    Number of Visits 18    Date for PT Re-Evaluation 01/17/21    Authorization Type [employer] HMO    PT Start Time 1004    PT Stop Time 1042    PT Time Calculation (min) 38 min    Activity Tolerance Patient tolerated treatment well    Behavior During Therapy [facility] for tasks assessed/performed             Past Medical History:  Diagnosis Date   Arthritis    Hx of adenomatous polyp of colon 09/14/2018   Hypothyroidism    Thyroid disease     Past Surgical History:  Procedure Laterality Date   adhesions of abdomen     from scar tissue from appendectomy   AMPUTATION OF REPLICATED TOES     partial tip amputation of left big toe, and next toe    APPENDECTOMY  1977   arthroscpy      left knee -meniscus tear   COLONOSCOPY     REVERSE SHOULDER ARTHROPLASTY Right 11/01/2020   Procedure: REVERSE SHOULDER ARTHROPLASTY;  Surgeon: Justice Britain, MD;  Location: [facility];  Service: Orthopedics;  Laterality: Right;  151min   STERIOD INJECTION Right 11/16/2017   Procedure: CORTISONE  INJECTION RIGHT KNEE;  Surgeon: [name], MD;  Location: [facility];  Service: Orthopedics;  Laterality: Right;   TOE SURGERY     TOTAL KNEE ARTHROPLASTY Left 11/16/2017   Procedure: LEFT TOTAL KNEE ARTHROPLASTY;  Surgeon: [name], MD;  [facility];  Service: Orthopedics;  Laterality: Left;   TOTAL KNEE ARTHROPLASTY Right 04/05/2018   Procedure: RIGHT TOTAL KNEE ARTHROPLASTY;  Surgeon: [name], MD;  [facility];  Service: Orthopedics;  Laterality: Right;    There were no vitals filed for this visit.    Subjective Assessment - 01/01/21 1006     Subjective States he is doing his exercises and has no current pain. Reports ROM is coming along.                [facility] PT Assessment - 01/01/21 0001       Assessment   Medical Diagnosis Rt artificial shoulder joint    Referring Provider (PT) Justice Britain, MD    Onset Date/Surgical Date 11/01/20    Hand Dominance Right                           [facility] Adult PT Treatment/Exercise - 01/01/21 0001       Shoulder Exercises: Supine   Other Supine Exercises rhythmic stabilization at 90 degrees flexion - 6 minutes total    Other Supine Exercises dowel rod bech press with punch x15 2" holds      Shoulder Exercises: Sidelying   External Rotation AROM;Right;10 reps   3 sets, 5" holds     Shoulder Exercises: Standing   Flexion Both;10 reps;Strengthening   3 sets, dowel with protraction and visual and verbal cues to keep right shoulder down   Extension 5 reps;Strengthening;20 reps   4 sets   Theraband Level (Shoulder Extension) Level 2 (Red)  Other Standing Exercises ball on wall greenmed ball x20 up/down and side to side, then CCW/CW x10 each      Shoulder Exercises: Pulleys   Flexion 2 minutes      Shoulder Exercises: Isometric Strengthening   External Rotation 5X10"   standing with red TB R0 2 sets                     PT Short Term Goals - 12/20/20 1105       PT SHORT TERM GOAL #1   Title Patient will demo independence in HEP to improve functional outcomes    Baseline Reports compliance and demos good return    Status Achieved      PT SHORT TERM GOAL #2   Title Patient will meet all stage 1 rehab protocol criteria to progress to stage 2    Baseline See protocol    Status Achieved               PT Long Term Goals - 12/20/20 1106       PT LONG TERM GOAL #1   Title Pt will be independent in advanced HEP for self-progression under protocol    Time 6    Period Weeks    Status On-going       PT LONG TERM GOAL #2   Title PAtient will meet FOTO objectives to improve functional outcomes    Baseline current 49% function    Time 6    Period Weeks    Status On-going      PT LONG TERM GOAL #3   Title Patient will demo right shoulder flexion to 90 degrees in standing to improve functional independence    Baseline Current 113 AROM    Time 6    Period Weeks    Status Achieved                   Plan - 01/01/21 1011     Clinical Impression Statement Overall patient is progressing well. Tolerated rhythmic stabilization well and able to correct right shoulder hike with AROM of shoulder flexion with visual and verbal cues. Will continue to progress strengthening as tolerated. Added side lying shoulder external rotation to HEP.    Personal Factors and Comorbidities Time since onset of injury/illness/exacerbation    Examination-Activity Limitations Bathing;Carry;Hygiene/Grooming;Toileting;Reach Overhead    Examination-Participation Restrictions Cleaning;Community Activity;Driving;Yard Work;Occupation;Meal Prep    Stability/Clinical Decision Making Stable/Uncomplicated    Rehab Potential Excellent    PT Frequency 2x / week    PT Duration 4 weeks    PT Treatment/Interventions ADLs/Self Care Home Management;Aquatic Therapy;Biofeedback;Cryotherapy;Electrical Stimulation;Moist Heat;Gait training;Stair training;Functional mobility training;Therapeutic activities;Therapeutic exercise;Balance training;Patient/family education;Neuromuscular re-education;Manual techniques;Passive range of motion    PT Next Visit Plan continue with reverse TSR protocol, stage 2. AAROM-AROM in scapular plane    PT Home Exercise Plan supine AAROM shoulder ROM 6/14 wall slides, shoulder isometrics 6/23 supine protraction, shoulder flexion AROM (focus on avoiding shoulder hike); shoulder ER    Consulted and Agree with Plan of Care Patient             Patient will benefit from skilled therapeutic  intervention in order to improve the following deficits and impairments:  Decreased activity tolerance, Decreased knowledge of precautions, Decreased range of motion, Decreased strength, Impaired UE functional use, Pain  Visit Diagnosis: Decreased range of motion of right shoulder  Other symptoms and signs involving the musculoskeletal system     Problem List Patient Active Problem List  Diagnosis Date Noted   Hx of adenomatous polyp of colon 09/14/2018   OA (osteoarthritis) of knee 11/16/2017   10:42 AM, 01/01/21 Jerene Pitch, DPT Physical Therapy with [facility]  [phone] office   [place] [address] [place], [place], [address] Phone: [phone]   Fax:  [phone]  Name: AZEEM POORMAN MRN: [id number] Date of Birth: [birth year]

## 2021-01-03 ENCOUNTER — Ambulatory Visit (HOSPITAL_COMMUNITY): Payer: Medicare HMO | Admitting: Physical Therapy

## 2021-01-03 ENCOUNTER — Encounter (HOSPITAL_COMMUNITY): Payer: Self-pay | Admitting: Physical Therapy

## 2021-01-03 ENCOUNTER — Other Ambulatory Visit: Payer: Self-pay

## 2021-01-03 DIAGNOSIS — M6281 Muscle weakness (generalized): Secondary | ICD-10-CM

## 2021-01-03 DIAGNOSIS — M25611 Stiffness of right shoulder, not elsewhere classified: Secondary | ICD-10-CM | POA: Diagnosis not present

## 2021-01-03 DIAGNOSIS — R29898 Other symptoms and signs involving the musculoskeletal system: Secondary | ICD-10-CM

## 2021-01-03 NOTE — Therapy (Signed)
[place], [place], [address] Phone: [phone]   Fax:  [phone]  Physical Therapy Treatment  Patient Details  Name: Jonathan Ochoa MRN: [id number] Date of Birth: Oct 04, 1950 Referring Provider (PT): Justice Britain, MD   Encounter Date: 01/03/2021   PT End of Session - 01/03/21 1046     Visit Number 14    Number of Visits 18    Date for PT Re-Evaluation 01/17/21    Authorization Type [employer] Medicare HMO    PT Start Time 1046    PT Stop Time 1125    PT Time Calculation (min) 39 min    Activity Tolerance Patient tolerated treatment well    Behavior During Therapy [facility] for tasks assessed/performed             Past Medical History:  Diagnosis Date   Arthritis    Hx of adenomatous polyp of colon 09/14/2018   Hypothyroidism    Thyroid disease     Past Surgical History:  Procedure Laterality Date   adhesions of abdomen     from scar tissue from appendectomy   AMPUTATION OF REPLICATED TOES     partial tip amputation of left big toe, and next toe    APPENDECTOMY  1977   arthroscpy      left knee -meniscus tear   COLONOSCOPY     REVERSE SHOULDER ARTHROPLASTY Right 11/01/2020   Procedure: REVERSE SHOULDER ARTHROPLASTY;  Surgeon: Justice Britain, MD;  Location: [facility];  Service: Orthopedics;  Laterality: Right;  12min   STERIOD INJECTION Right 11/16/2017   Procedure: CORTISONE  INJECTION RIGHT KNEE;  Surgeon: [name], MD;  Location: [facility];  Service: Orthopedics;  Laterality: Right;   TOE SURGERY     TOTAL KNEE ARTHROPLASTY Left 11/16/2017   Procedure: LEFT TOTAL KNEE ARTHROPLASTY;  Surgeon: [name], MD;  [facility];  Service: Orthopedics;  Laterality: Left;   TOTAL KNEE ARTHROPLASTY Right 04/05/2018   Procedure: RIGHT TOTAL KNEE ARTHROPLASTY;  Surgeon: [name], MD;  [facility];  Service: Orthopedics;  Laterality: Right;    There were no vitals filed for this visit.    Subjective Assessment - 01/03/21 1049     Subjective States that he was sore in the shoulder blade muscles after last session. Reports no pain on this date.    Currently in Pain? No/denies                [facility] PT Assessment - 01/03/21 0001       Assessment   Medical Diagnosis Rt artificial shoulder joint    Referring Provider (PT) Justice Britain, MD    Onset Date/Surgical Date 11/01/20    Hand Dominance Right    Next MD Visit 01/28/21                           [facility] Adult PT Treatment/Exercise - 01/03/21 0001       Shoulder Exercises: Supine   Protraction Right;20 reps   5" holds   Other Supine Exercises rhythmic stabilization at 90 degrees flexion both arms on green ball - 5 minutes total    Other Supine Exercises PROm of right shoulder in all planes      Shoulder Exercises: Standing   Protraction --   trialed at wall - too difficult for patient to perform with good form - tranisitoned to supine   External Rotation Strengthening;Right;5 reps;Theraband   3  sets with RTB   Theraband Level (Shoulder External Rotation) Level 2 (Red)    External Rotation Weight (lbs) dyanamic isometric    Internal Rotation Right;5 reps;Theraband;Strengthening   3 sets   Theraband Level (Shoulder Internal Rotation) Level 2 (Red)    Internal Rotation Weight (lbs) dyanamic isometric    Flexion Other (comment);AAROM;Both   at wall with ball x20 5" holds   Extension 15 reps;AROM   x2 one set of each   Theraband Level (Shoulder Extension) Level 3 (Green)    Row Theraband;15 reps    Theraband Level (Shoulder Row) Level 3 (Green)      Shoulder Exercises: Pulleys   Flexion 2 minutes                      PT Short Term Goals - 12/20/20 1105       PT SHORT TERM GOAL #1   Title Patient will demo independence in HEP to improve functional outcomes    Baseline Reports compliance and demos good return    Status Achieved      PT SHORT TERM GOAL #2   Title Patient will  meet all stage 1 rehab protocol criteria to progress to stage 2    Baseline See protocol    Status Achieved               PT Long Term Goals - 12/20/20 1106       PT LONG TERM GOAL #1   Title Pt will be independent in advanced HEP for self-progression under protocol    Time 6    Period Weeks    Status On-going      PT LONG TERM GOAL #2   Title PAtient will meet FOTO objectives to improve functional outcomes    Baseline current 49% function    Time 6    Period Weeks    Status On-going      PT LONG TERM GOAL #3   Title Patient will demo right shoulder flexion to 90 degrees in standing to improve functional independence    Baseline Current 113 AROM    Time 6    Period Weeks    Status Achieved                   Plan - 01/03/21 1046     Clinical Impression Statement Continued with shoulder mobility and strengthening as tolerated. Trialed scapular protraction at wall but this was no performed with good form so transitioned to supine which was tolerated better. Continued with PROM and rhythmic stabilization. Fatigue noted end of session but no pain. Will continue with shoulder rehab as tolerated.    Personal Factors and Comorbidities Time since onset of injury/illness/exacerbation    Examination-Activity Limitations Bathing;Carry;Hygiene/Grooming;Toileting;Reach Overhead    Examination-Participation Restrictions Cleaning;Community Activity;Driving;Yard Work;Occupation;Meal Prep    Stability/Clinical Decision Making Stable/Uncomplicated    Rehab Potential Excellent    PT Frequency 2x / week    PT Duration 4 weeks    PT Treatment/Interventions ADLs/Self Care Home Management;Aquatic Therapy;Biofeedback;Cryotherapy;Electrical Stimulation;Moist Heat;Gait training;Stair training;Functional mobility training;Therapeutic activities;Therapeutic exercise;Balance training;Patient/family education;Neuromuscular re-education;Manual techniques;Passive range of motion    PT Next  Visit Plan continue with reverse TSR protocol, stage 2. AAROM-AROM in scapular plane    PT Home Exercise Plan supine AAROM shoulder ROM 6/14 wall slides, shoulder isometrics 6/23 supine protraction, shoulder flexion AROM (focus on avoiding shoulder hike); shoulder ER    Consulted and Agree with Plan of Care Patient  Patient will benefit from skilled therapeutic intervention in order to improve the following deficits and impairments:  Decreased activity tolerance, Decreased knowledge of precautions, Decreased range of motion, Decreased strength, Impaired UE functional use, Pain  Visit Diagnosis: Decreased range of motion of right shoulder  Other symptoms and signs involving the musculoskeletal system  Muscle weakness (generalized)     Problem List Patient Active Problem List   Diagnosis Date Noted   Hx of adenomatous polyp of colon 09/14/2018   OA (osteoarthritis) of knee 11/16/2017    Eliezer Champagne 01/03/2021, 11:26 AM  [place], [place], [address] Phone: [phone]   Fax:  [phone]  Name: Jonathan Ochoa MRN: [id number] Date of Birth: 1951-05-03

## 2021-01-09 ENCOUNTER — Ambulatory Visit (HOSPITAL_COMMUNITY): Payer: Medicare HMO | Attending: Orthopedic Surgery | Admitting: Physical Therapy

## 2021-01-09 ENCOUNTER — Encounter (HOSPITAL_COMMUNITY): Payer: Self-pay | Admitting: Physical Therapy

## 2021-01-09 ENCOUNTER — Other Ambulatory Visit: Payer: Self-pay

## 2021-01-09 DIAGNOSIS — M6281 Muscle weakness (generalized): Secondary | ICD-10-CM | POA: Insufficient documentation

## 2021-01-09 DIAGNOSIS — M25611 Stiffness of right shoulder, not elsewhere classified: Secondary | ICD-10-CM | POA: Diagnosis not present

## 2021-01-09 DIAGNOSIS — R29898 Other symptoms and signs involving the musculoskeletal system: Secondary | ICD-10-CM

## 2021-01-09 NOTE — Therapy (Signed)
[place], [place], [address] Phone: [phone]   Fax:  [phone]  Physical Therapy Treatment  Patient Details  Name: Jonathan Ochoa MRN: [id number] Date of Birth: [birth year] Referring Provider (PT): Justice Britain, MD   Encounter Date: 01/09/2021   PT End of Session - 01/09/21 0750     Visit Number 15    Number of Visits 18    Date for PT Re-Evaluation 01/17/21    Authorization Type [employer] HMO    PT Start Time [phone]    PT Stop Time 0825    PT Time Calculation (min) 38 min    Activity Tolerance Patient tolerated treatment well    Behavior During Therapy [facility] for tasks assessed/performed             Past Medical History:  Diagnosis Date   Arthritis    Hx of adenomatous polyp of colon 09/14/2018   Hypothyroidism    Thyroid disease     Past Surgical History:  Procedure Laterality Date   adhesions of abdomen     from scar tissue from appendectomy   AMPUTATION OF REPLICATED TOES     partial tip amputation of left big toe, and next toe    APPENDECTOMY  1977   arthroscpy      left knee -meniscus tear   COLONOSCOPY     REVERSE SHOULDER ARTHROPLASTY Right 11/01/2020   Procedure: REVERSE SHOULDER ARTHROPLASTY;  Surgeon: Justice Britain, MD;  Location: [facility];  Service: Orthopedics;  Laterality: Right;  141min   STERIOD INJECTION Right 11/16/2017   Procedure: CORTISONE  INJECTION RIGHT KNEE;  Surgeon: [name], MD;  Location: [facility];  Service: Orthopedics;  Laterality: Right;   TOE SURGERY     TOTAL KNEE ARTHROPLASTY Left 11/16/2017   Procedure: LEFT TOTAL KNEE ARTHROPLASTY;  Surgeon: [name], MD;  [facility]: [facility];  Service: Orthopedics;  Laterality: Left;   TOTAL KNEE ARTHROPLASTY Right 04/05/2018   Procedure: RIGHT TOTAL KNEE ARTHROPLASTY;  Surgeon: [name], MD;  [facility];  Service: Orthopedics;  Laterality: Right;    There were no vitals filed for this visit.    Subjective Assessment - 01/09/21 0750     Subjective States he has no soreness in shoulder or pain.    Currently in Pain? No/denies                [facility] PT Assessment - 01/09/21 0001       Assessment   Medical Diagnosis Rt artificial shoulder joint    Referring Provider (PT) Justice Britain, MD    Onset Date/Surgical Date 11/01/20    Hand Dominance Right    Next MD Visit 01/28/21                           [facility] Adult PT Treatment/Exercise - 01/09/21 0001       Shoulder Exercises: Supine   Diagonals AROM;Right;10 reps   D1 and D2 - wiht PT assist initially x2 sets 5" holds   Other Supine Exercises PROm of right shoulder in all planes      Shoulder Exercises: Sidelying   External Rotation AROM;Right;10 reps   3 sets with 3" holds   Flexion AROM;10 reps;Right   3 sets     Shoulder Exercises: Standing   Protraction Both;10 reps   at wall 2 sets 5" holds   Flexion --   ball up wall with left  and right reach 2x15 5" holds     Shoulder Exercises: Pulleys   Flexion 2 minutes    Flexion Limitations standing, cueing for posture and reduce compensation                      PT Short Term Goals - 12/20/20 1105       PT SHORT TERM GOAL #1   Title Patient will demo independence in HEP to improve functional outcomes    Baseline Reports compliance and demos good return    Status Achieved      PT SHORT TERM GOAL #2   Title Patient will meet all stage 1 rehab protocol criteria to progress to stage 2    Baseline See protocol    Status Achieved               PT Long Term Goals - 12/20/20 1106       PT LONG TERM GOAL #1   Title Pt will be independent in advanced HEP for self-progression under protocol    Time 6    Period Weeks    Status On-going      PT LONG TERM GOAL #2   Title PAtient will meet FOTO objectives to improve functional outcomes    Baseline current 49% function    Time 6    Period Weeks    Status On-going      PT LONG  TERM GOAL #3   Title Patient will demo right shoulder flexion to 90 degrees in standing to improve functional independence    Baseline Current 113 AROM    Time 6    Period Weeks    Status Achieved                   Plan - 01/09/21 0093     Clinical Impression Statement Continued to progress active ROM and strengthening as tolerated. Cues to reduce scapular elevation with all motions. No pain noted during session but fatigue in posterior shoulder muscles noted. Overall patient is 10 weeks post operative tomorrow and is doing very well with current ROM and function. Will continue with current POC as tolerated.    Personal Factors and Comorbidities Time since onset of injury/illness/exacerbation    Examination-Activity Limitations Bathing;Carry;Hygiene/Grooming;Toileting;Reach Overhead    Examination-Participation Restrictions Cleaning;Community Activity;Driving;Yard Work;Occupation;Meal Prep    Stability/Clinical Decision Making Stable/Uncomplicated    Rehab Potential Excellent    PT Frequency 2x / week    PT Duration 4 weeks    PT Treatment/Interventions ADLs/Self Care Home Management;Aquatic Therapy;Biofeedback;Cryotherapy;Electrical Stimulation;Moist Heat;Gait training;Stair training;Functional mobility training;Therapeutic activities;Therapeutic exercise;Balance training;Patient/family education;Neuromuscular re-education;Manual techniques;Passive range of motion    PT Next Visit Plan continue with reverse TSR protocol, stage 2. AAROM-AROM in scapular plane    PT Home Exercise Plan supine AAROM shoulder ROM 6/14 wall slides, shoulder isometrics 6/23 supine protraction, shoulder flexion AROM (focus on avoiding shoulder hike); shoulder ER, s/l shoulder flexion    Consulted and Agree with Plan of Care Patient             Patient will benefit from skilled therapeutic intervention in order to improve the following deficits and impairments:  Decreased activity tolerance, Decreased  knowledge of precautions, Decreased range of motion, Decreased strength, Impaired UE functional use, Pain  Visit Diagnosis: Decreased range of motion of right shoulder  Muscle weakness (generalized)  Other symptoms and signs involving the musculoskeletal system     Problem List Patient Active Problem List   Diagnosis  Date Noted   Hx of adenomatous polyp of colon 09/14/2018   OA (osteoarthritis) of knee 11/16/2017   8:26 AM, 01/09/21 Jerene Pitch, DPT Physical Therapy with [facility]  [phone] office   [place] [address] [place], [place], [address] Phone: [phone]   Fax:  [phone]  Name: Jonathan Ochoa MRN: [id number] Date of Birth: [birth year]

## 2021-01-10 ENCOUNTER — Ambulatory Visit (HOSPITAL_COMMUNITY): Payer: Medicare HMO | Admitting: Physical Therapy

## 2021-01-10 ENCOUNTER — Encounter (HOSPITAL_COMMUNITY): Payer: Self-pay | Admitting: Physical Therapy

## 2021-01-10 DIAGNOSIS — R29898 Other symptoms and signs involving the musculoskeletal system: Secondary | ICD-10-CM

## 2021-01-10 DIAGNOSIS — M25611 Stiffness of right shoulder, not elsewhere classified: Secondary | ICD-10-CM | POA: Diagnosis not present

## 2021-01-10 DIAGNOSIS — M6281 Muscle weakness (generalized): Secondary | ICD-10-CM | POA: Diagnosis not present

## 2021-01-10 NOTE — Therapy (Signed)
[place], [place], [address] Phone: [phone]   Fax:  [phone]  Physical Therapy Treatment  Patient Details  Name: Jonathan Ochoa MRN: [id number] Date of Birth: 1951/06/13 Referring Provider (PT): Justice Britain, MD   Encounter Date: 01/10/2021   PT End of Session - 01/10/21 1401     Visit Number 16    Number of Visits 18    Date for PT Re-Evaluation 01/17/21    Authorization Type [employer] HMO    PT Start Time 1401    PT Stop Time 1440    PT Time Calculation (min) 39 min    Activity Tolerance Patient tolerated treatment well    Behavior During Therapy [facility] for tasks assessed/performed             Past Medical History:  Diagnosis Date   Arthritis    Hx of adenomatous polyp of colon 09/14/2018   Hypothyroidism    Thyroid disease     Past Surgical History:  Procedure Laterality Date   adhesions of abdomen     from scar tissue from appendectomy   AMPUTATION OF REPLICATED TOES     partial tip amputation of left big toe, and next toe    APPENDECTOMY  1977   arthroscpy      left knee -meniscus tear   COLONOSCOPY     REVERSE SHOULDER ARTHROPLASTY Right 11/01/2020   Procedure: REVERSE SHOULDER ARTHROPLASTY;  Surgeon: Justice Britain, MD;  Location: [facility];  Service: Orthopedics;  Laterality: Right;  150min   STERIOD INJECTION Right 11/16/2017   Procedure: CORTISONE  INJECTION RIGHT KNEE;  Surgeon: [name], MD;  Location: [facility];  Service: Orthopedics;  Laterality: Right;   TOE SURGERY     TOTAL KNEE ARTHROPLASTY Left 11/16/2017   Procedure: LEFT TOTAL KNEE ARTHROPLASTY;  Surgeon: [name], MD;  Location: [facility];  Service: Orthopedics;  Laterality: Left;   TOTAL KNEE ARTHROPLASTY Right 04/05/2018   Procedure: RIGHT TOTAL KNEE ARTHROPLASTY;  Surgeon: [name], MD;  [facility];  Service: Orthopedics;  Laterality: Right;    There were no vitals filed for this visit.    Subjective Assessment - 01/10/21 1405     Subjective Reports no pain or soreness on this date    Currently in Pain? No/denies                [facility] PT Assessment - 01/10/21 0001       Assessment   Medical Diagnosis Rt artificial shoulder joint    Referring Provider (PT) Justice Britain, MD    Onset Date/Surgical Date 11/01/20    Hand Dominance Right    Next MD Visit 01/28/21                           [facility] Adult PT Treatment/Exercise - 01/10/21 0001       Shoulder Exercises: Seated   Other Seated Exercises UBE forward and backwards 2.5 minutes each level 3      Shoulder Exercises: Standing   External Rotation Right   4x6 5" holds green TB   Theraband Level (Shoulder External Rotation) Level 3 (Green)    Internal Rotation Right;AROM;Strengthening;Theraband   4x 6 5" holds   Theraband Level (Shoulder Internal Rotation) Level 3 (Green)    Flexion AAROM   with cane - with visual cues, verbal cues of reaching forward 3x 2 minutes bilateral   Other Standing Exercises  shoulder ER at wall bilateral x 2.5 minutes 5" holds into ER    Other Standing Exercises ball up and to the side up wall x 2.5 minutes;                      PT Short Term Goals - 12/20/20 1105       PT SHORT TERM GOAL #1   Title Patient will demo independence in HEP to improve functional outcomes    Baseline Reports compliance and demos good return    Status Achieved      PT SHORT TERM GOAL #2   Title Patient will meet all stage 1 rehab protocol criteria to progress to stage 2    Baseline See protocol    Status Achieved               PT Long Term Goals - 12/20/20 1106       PT LONG TERM GOAL #1   Title Pt will be independent in advanced HEP for self-progression under protocol    Time 6    Period Weeks    Status On-going      PT LONG TERM GOAL #2   Title PAtient will meet FOTO objectives to improve functional outcomes    Baseline current 49% function    Time 6     Period Weeks    Status On-going      PT LONG TERM GOAL #3   Title Patient will demo right shoulder flexion to 90 degrees in standing to improve functional independence    Baseline Current 113 AROM    Time 6    Period Weeks    Status Achieved                   Plan - 01/10/21 1401     Clinical Impression Statement Focused on shoulder flexion endurance today with improved mechancis. Overall shoulder ROM improved with reaching forward (protraction) cue and with visual cues. Required tactile cues to reduce compensatory trunk strategies during shoulder isometrics with band. Ended with UBE to reduce post workout muscle soreness. Fatigue noted end of session.    Personal Factors and Comorbidities Time since onset of injury/illness/exacerbation    Examination-Activity Limitations Bathing;Carry;Hygiene/Grooming;Toileting;Reach Overhead    Examination-Participation Restrictions Cleaning;Community Activity;Driving;Yard Work;Occupation;Meal Prep    Stability/Clinical Decision Making Stable/Uncomplicated    Rehab Potential Excellent    PT Frequency 2x / week    PT Duration 4 weeks    PT Treatment/Interventions ADLs/Self Care Home Management;Aquatic Therapy;Biofeedback;Cryotherapy;Electrical Stimulation;Moist Heat;Gait training;Stair training;Functional mobility training;Therapeutic activities;Therapeutic exercise;Balance training;Patient/family education;Neuromuscular re-education;Manual techniques;Passive range of motion    PT Next Visit Plan continue with reverse TSR protocol, stage 2. AAROM-AROM in scapular plane    PT Home Exercise Plan supine AAROM shoulder ROM 6/14 wall slides, shoulder isometrics 6/23 supine protraction, shoulder flexion AROM (focus on avoiding shoulder hike); shoulder ER, s/l shoulder flexion; 7/7 shoulder flexion endurance and W    Consulted and Agree with Plan of Care Patient             Patient will benefit from skilled therapeutic intervention in order to  improve the following deficits and impairments:  Decreased activity tolerance, Decreased knowledge of precautions, Decreased range of motion, Decreased strength, Impaired UE functional use, Pain  Visit Diagnosis: Decreased range of motion of right shoulder  Muscle weakness (generalized)  Other symptoms and signs involving the musculoskeletal system     Problem List Patient Active Problem List   Diagnosis  Date Noted   Hx of adenomatous polyp of colon 09/14/2018   OA (osteoarthritis) of knee 11/16/2017   2:43 PM, 01/10/21 Jerene Pitch, DPT Physical Therapy with [facility]  [phone] office   [place] [address] [place], [place], [address] Phone: [phone]   Fax:  [phone]  Name: Jonathan Ochoa MRN: [id number] Date of Birth: 1951-01-19

## 2021-01-15 ENCOUNTER — Other Ambulatory Visit: Payer: Self-pay

## 2021-01-15 ENCOUNTER — Ambulatory Visit (HOSPITAL_COMMUNITY): Payer: Medicare HMO

## 2021-01-15 DIAGNOSIS — M25611 Stiffness of right shoulder, not elsewhere classified: Secondary | ICD-10-CM

## 2021-01-15 DIAGNOSIS — R29898 Other symptoms and signs involving the musculoskeletal system: Secondary | ICD-10-CM | POA: Diagnosis not present

## 2021-01-15 DIAGNOSIS — M6281 Muscle weakness (generalized): Secondary | ICD-10-CM | POA: Diagnosis not present

## 2021-01-15 NOTE — Telephone Encounter (Signed)
error 

## 2021-01-15 NOTE — Patient Instructions (Signed)
Access Code: [email or web address] URL: [email or web address] Date: 01/15/2021 Prepared by: Sherlyn Lees  Exercises Shoulder External Rotation and Scapular Retraction with Resistance - 1 x daily - 7 x weekly - 3 sets - 10 reps - 2 sec hold

## 2021-01-15 NOTE — Therapy (Signed)
[place], [place], [address] Phone: [phone]   Fax:  [phone]  Physical Therapy Treatment  Patient Details  Name: Jonathan Ochoa MRN: [id number] Date of Birth: 04/04/1951 Referring Provider (PT): Justice Britain, MD   Encounter Date: 01/15/2021   PT End of Session - 01/15/21 0901     Visit Number 17    Number of Visits 18    Date for PT Re-Evaluation 01/17/21    Authorization Type [employer] Medicare HMO    PT Start Time 0900    PT Stop Time 0945    PT Time Calculation (min) 45 min    Activity Tolerance Patient tolerated treatment well    Behavior During Therapy [facility] for tasks assessed/performed             Past Medical History:  Diagnosis Date   Arthritis    Hx of adenomatous polyp of colon 09/14/2018   Hypothyroidism    Thyroid disease     Past Surgical History:  Procedure Laterality Date   adhesions of abdomen     from scar tissue from appendectomy   AMPUTATION OF REPLICATED TOES     partial tip amputation of left big toe, and next toe    APPENDECTOMY  1977   arthroscpy      left knee -meniscus tear   COLONOSCOPY     REVERSE SHOULDER ARTHROPLASTY Right 11/01/2020   Procedure: REVERSE SHOULDER ARTHROPLASTY;  Surgeon: Justice Britain, MD;  [facility];  Service: Orthopedics;  Laterality: Right;  142min   STERIOD INJECTION Right 11/16/2017   Procedure: CORTISONE  INJECTION RIGHT KNEE;  Surgeon: [name], MD;  [facility]: [facility];  Service: Orthopedics;  Laterality: Right;   TOE SURGERY     TOTAL KNEE ARTHROPLASTY Left 11/16/2017   Procedure: LEFT TOTAL KNEE ARTHROPLASTY;  Surgeon: [name], MD;  [facility];  Service: Orthopedics;  Laterality: Left;   TOTAL KNEE ARTHROPLASTY Right 04/05/2018   Procedure: RIGHT TOTAL KNEE ARTHROPLASTY;  Surgeon: [name], MD;  [facility];  Service: Orthopedics;  Laterality: Right;    There were no vitals filed for this visit.    Subjective Assessment - 01/15/21 0900     Subjective No issues and feels like right shoulder is starting to get some strength    Currently in Pain? No/denies    Pain Score 0-No pain    Pain Location Shoulder    Pain Orientation Right                [facility] PT Assessment - 01/15/21 0001       Assessment   Medical Diagnosis Rt artificial shoulder joint    Referring Provider (PT) Justice Britain, MD    Onset Date/Surgical Date 11/01/20    Hand Dominance Right    Next MD Visit 01/28/21      Observation/Other Assessments   Focus on Therapeutic Outcomes (FOTO)  66% function      AROM   Right Shoulder Flexion 128 Degrees    Right Shoulder ABduction 90 Degrees    Right Shoulder External Rotation 55 Degrees                           [facility] Adult PT Treatment/Exercise - 01/15/21 0001       Shoulder Exercises: Seated   Other Seated Exercises UBE forward and backwards 2.5 minutes each level 3      Shoulder Exercises: Standing  Extension Strengthening;Both;20 reps;Theraband    Theraband Level (Shoulder Extension) Level 2 (Red)    Row Strengthening;Right;20 reps;Weights    Row Weight (lbs) 5 plates on high-low cable    Shoulder Elevation Strengthening;Standing    Shoulder Elevation Limitations lifting 5kg ball to shoulder level shelf 2x10    Other Standing Exercises dynamic isometric IR/ER double red 2x10    Other Standing Exercises tricep extension 8 plates 2x10. Bicep curls 8 lbs 2x10      Shoulder Exercises: Therapy Ball   Other Therapy Ball Exercises quick catch with 1 kg ball overhand/underhand grip 2x10      Shoulder Exercises: ROM/Strengthening   Other ROM/Strengthening Exercises body blade shoulder depression/elevation, flexion/retraction x 60 sec                      PT Short Term Goals - 12/20/20 1105       PT SHORT TERM GOAL #1   Title Patient will demo independence in HEP to improve functional outcomes    Baseline Reports compliance  and demos good return    Status Achieved      PT SHORT TERM GOAL #2   Title Patient will meet all stage 1 rehab protocol criteria to progress to stage 2    Baseline See protocol    Status Achieved               PT Long Term Goals - 01/15/21 0912       PT LONG TERM GOAL #1   Title Pt will be independent in advanced HEP for self-progression under protocol    Time 6    Period Weeks    Status On-going      PT LONG TERM GOAL #2   Title PAtient will meet FOTO objectives to improve functional outcomes    Baseline current 49% function at [facility]. Note; current 66% function    Time 6    Period Weeks    Status Achieved      PT LONG TERM GOAL #3   Title Patient will demo right shoulder flexion to 90 degrees in standing to improve functional independence    Baseline Current 113 AROM; 128 flexion, 90 abduction, 55 ER    Time 6    Period Weeks    Status Achieved                   Plan - 01/15/21 0941     Clinical Impression Statement Progressing very well with POC details and demonstrates global improvements in right shoulder ROM and strength. Good compliance with HEP and able to progress PRE with resistance bands without adverse effects. D/C at next visit to HEP    Personal Factors and Comorbidities Time since onset of injury/illness/exacerbation    Examination-Activity Limitations Bathing;Carry;Hygiene/Grooming;Toileting;Reach Overhead    Examination-Participation Restrictions Cleaning;Community Activity;Driving;Yard Work;Occupation;Meal Prep    Stability/Clinical Decision Making Stable/Uncomplicated    Rehab Potential Excellent    PT Frequency 2x / week    PT Duration 4 weeks    PT Treatment/Interventions ADLs/Self Care Home Management;Aquatic Therapy;Biofeedback;Cryotherapy;Electrical Stimulation;Moist Heat;Gait training;Stair training;Functional mobility training;Therapeutic activities;Therapeutic exercise;Balance training;Patient/family education;Neuromuscular  re-education;Manual techniques;Passive range of motion    PT Next Visit Plan D/C to HEP    PT Home Exercise Plan supine AAROM shoulder ROM 6/14 wall slides, shoulder isometrics 6/23 supine protraction, shoulder flexion AROM (focus on avoiding shoulder hike); shoulder ER, s/l shoulder flexion; 7/7 shoulder flexion endurance and W. ER+_retraction with green    Consulted and  Agree with Plan of Care Patient             Patient will benefit from skilled therapeutic intervention in order to improve the following deficits and impairments:  Decreased activity tolerance, Decreased knowledge of precautions, Decreased range of motion, Decreased strength, Impaired UE functional use, Pain  Visit Diagnosis: Decreased range of motion of right shoulder  Muscle weakness (generalized)  Other symptoms and signs involving the musculoskeletal system     Problem List Patient Active Problem List   Diagnosis Date Noted   Hx of adenomatous polyp of colon 09/14/2018   OA (osteoarthritis) of knee 11/16/2017   9:43 AM, 01/15/21 M. Sherlyn Lees, PT, DPT Physical Therapist- [place] Office Number: [phone]   [place] [address] [place], [place], [address] Phone: [phone]   Fax:  [phone]  Name: Jonathan Ochoa MRN: [id number] Date of Birth: [birth year]

## 2021-01-17 ENCOUNTER — Other Ambulatory Visit: Payer: Self-pay

## 2021-01-17 ENCOUNTER — Ambulatory Visit (HOSPITAL_COMMUNITY): Payer: Medicare HMO | Admitting: Physical Therapy

## 2021-01-17 ENCOUNTER — Encounter (HOSPITAL_COMMUNITY): Payer: Self-pay | Admitting: Physical Therapy

## 2021-01-17 DIAGNOSIS — R29898 Other symptoms and signs involving the musculoskeletal system: Secondary | ICD-10-CM

## 2021-01-17 DIAGNOSIS — M6281 Muscle weakness (generalized): Secondary | ICD-10-CM | POA: Diagnosis not present

## 2021-01-17 DIAGNOSIS — M25611 Stiffness of right shoulder, not elsewhere classified: Secondary | ICD-10-CM

## 2021-01-17 NOTE — Therapy (Signed)
[place], [place], [address] Phone: [phone]   Fax:  [phone]  Physical Therapy Treatment  Patient Details  Name: Jonathan Ochoa MRN: [id number] Date of Birth: [birth year] Referring Provider (PT): Justice Britain, MD  PHYSICAL THERAPY DISCHARGE SUMMARY  Visits from Start of Care: 18  Current functional level related to goals / functional outcomes: See below    Remaining deficits: See below      Education / Equipment: See assessment    Patient agrees to discharge. Patient goals were met. Patient is being discharged due to meeting the stated rehab goals.   Encounter Date: 01/17/2021   PT End of Session - 01/17/21 0903     Visit Number 18    Number of Visits 18    Date for PT Re-Evaluation 01/17/21    Authorization Type [employer] Medicare HMO    PT Start Time [phone]    PT Stop Time 0945    PT Time Calculation (min) 46 min    Activity Tolerance Patient tolerated treatment well    Behavior During Therapy [facility] for tasks assessed/performed             Past Medical History:  Diagnosis Date   Arthritis    Hx of adenomatous polyp of colon 09/14/2018   Hypothyroidism    Thyroid disease     Past Surgical History:  Procedure Laterality Date   adhesions of abdomen     from scar tissue from appendectomy   AMPUTATION OF REPLICATED TOES     partial tip amputation of left big toe, and next toe    APPENDECTOMY  1977   arthroscpy      left knee -meniscus tear   COLONOSCOPY     REVERSE SHOULDER ARTHROPLASTY Right 11/01/2020   Procedure: REVERSE SHOULDER ARTHROPLASTY;  Surgeon: Justice Britain, MD;  [facility];  Service: Orthopedics;  Laterality: Right;  189mn   STERIOD INJECTION Right 11/16/2017   Procedure: CORTISONE  INJECTION RIGHT KNEE;  Surgeon: AGaynelle Arabian MD;  [facility];  Service: Orthopedics;  Laterality: Right;   TOE SURGERY     TOTAL KNEE ARTHROPLASTY Left 11/16/2017   Procedure:  LEFT TOTAL KNEE ARTHROPLASTY;  Surgeon: AGaynelle Arabian MD;  [facility];  Service: Orthopedics;  Laterality: Left;   TOTAL KNEE ARTHROPLASTY Right 04/05/2018   Procedure: RIGHT TOTAL KNEE ARTHROPLASTY;  Surgeon: AGaynelle Arabian MD;  [facility];  Service: Orthopedics;  Laterality: Right;    There were no vitals filed for this visit.   Subjective Assessment - 01/17/21 0903     Subjective Patient says he is doing very well. He feels nearly 100% improvement with RT shoulder function. No pain. Able to do everyday tasks around the house without issues.    Currently in Pain? No/denies                               [facility] - [facility]       Shoulder Exercises: Seated   Other Seated Exercises UBE forward and backwards 2.5 minutes each level 3      Shoulder Exercises: Standing   External Rotation Right;20 reps;Theraband    Theraband Level (Shoulder External Rotation) Level 3 (Green)    Internal Rotation Right;20 reps;Theraband    Theraband Level (Shoulder Internal Rotation) Level 3 (Green)    Flexion Both;10 reps    ABduction Both;10 reps  Extension Both;20 reps;Theraband    Theraband Level (Shoulder Extension) Level 3 (Green)    Row [employer];Theraband    Theraband Level (Shoulder Row) Level 3 (Green)    Diagonals Right;10 reps;Theraband    Theraband Level (Shoulder Diagonals) Level 2 (Red)    Diagonals Limitations PNF 2    Other Standing Exercises body blade ER and shoulder flexion 2 x 20" each    Other Standing Exercises ball on wall circles shoulder stabs red ball x20 CW/CCW      Shoulder Exercises: Pulleys   Flexion 2 minutes                      PT Short Term Goals - 12/20/20 1105       PT SHORT TERM GOAL #1   Title Patient will demo independence in HEP to improve functional outcomes    Baseline Reports compliance and demos good return    Status Achieved      PT SHORT TERM GOAL #2   Title  Patient will meet all stage 1 rehab protocol criteria to progress to stage 2    Baseline See protocol    Status Achieved               PT Long Term Goals - 01/17/21 3474       PT LONG TERM GOAL #1   Title Pt will be independent in advanced HEP for self-progression under protocol    Baseline Revied HEP today, demos good return. Answered all questions.    Time 6    Period Weeks    Status Achieved      PT LONG TERM GOAL #2   Title PAtient will meet FOTO objectives to improve functional outcomes    Baseline current 49% function at [facility]. Note; current 66% function    Time 6    Period Weeks    Status Achieved      PT LONG TERM GOAL #3   Title Patient will demo right shoulder flexion to 90 degrees in standing to improve functional independence    Baseline Current 113 AROM; 128 flexion, 90 abduction, 55 ER    Time 6    Period Weeks    Status Achieved                   Plan - 01/17/21 0941     Clinical Impression Statement Patient shows good progress toward therapy goals and has currently met all goals. Reports no pain with ADLs. Good shoulder elevation and fair strength at and above shoulder height. Will continue to work strength and stabilization exercise independently with home program. Patient being DC today to HEP. Reviewed all exercise and answered all patient questions. Issued handout. Patient encouraged to follow up with therapy services with any further questions or concerns.    Personal Factors and Comorbidities Time since onset of injury/illness/exacerbation    Examination-Activity Limitations Bathing;Carry;Hygiene/Grooming;Toileting;Reach Overhead    Examination-Participation Restrictions Cleaning;Community Activity;Driving;Yard Work;Occupation;Meal Prep    Stability/Clinical Decision Making Stable/Uncomplicated    Rehab Potential Excellent    PT Treatment/Interventions ADLs/Self Care Home Management;Aquatic Therapy;Biofeedback;Cryotherapy;Electrical  Stimulation;Moist Heat;Gait training;Stair training;Functional mobility training;Therapeutic activities;Therapeutic exercise;Balance training;Patient/family education;Neuromuscular re-education;Manual techniques;Passive range of motion    PT Next Visit Plan D/C to HEP    PT Home Exercise Plan supine AAROM shoulder ROM 6/14 wall slides, shoulder isometrics 6/23 supine protraction, shoulder flexion AROM (focus on avoiding shoulder hike); shoulder ER, s/l shoulder flexion; 7/7 shoulder flexion endurance and W. ER+_retraction with  green    Consulted and Agree with Plan of Care Patient             Patient will benefit from skilled therapeutic intervention in order to improve the following deficits and impairments:  Decreased activity tolerance, Decreased knowledge of precautions, Decreased range of motion, Decreased strength, Impaired UE functional use, Pain  Visit Diagnosis: Decreased range of motion of right shoulder  Muscle weakness (generalized)  Other symptoms and signs involving the musculoskeletal system     Problem List Patient Active Problem List   Diagnosis Date Noted   Hx of adenomatous polyp of colon 09/14/2018   OA (osteoarthritis) of knee 11/16/2017   9:47 AM, 01/17/21 Josue Hector PT DPT  Physical Therapist with [facility]  ([phone] [place], [place], [address] Phone: [phone]   Fax:  [phone]  Name: ALBINO BUFFORD MRN: [id number] Date of Birth: Jul 12, 1950

## 2021-01-17 NOTE — Patient Instructions (Signed)
Access Code: [email or web address] URL: [email or web address] Date: 01/17/2021 Prepared by: Josue Hector  Exercises Wall Push Up - 1 x daily - 3-4 x weekly - 2 sets - 10 reps Standing Shoulder Single Arm PNF D2 Flexion with Resistance - 1 x daily - 3-4 x weekly - 2 sets - 10 reps Standing Shoulder Internal Rotation Stretch with Towel - 1 x daily - 1 sets - 10 reps

## 2021-01-22 ENCOUNTER — Encounter (HOSPITAL_COMMUNITY): Payer: Medicare HMO | Admitting: Physical Therapy

## 2021-01-24 ENCOUNTER — Encounter (HOSPITAL_COMMUNITY): Payer: Medicare HMO | Admitting: Physical Therapy

## 2021-01-28 DIAGNOSIS — Z96611 Presence of right artificial shoulder joint: Secondary | ICD-10-CM | POA: Diagnosis not present

## 2021-01-28 DIAGNOSIS — Z471 Aftercare following joint replacement surgery: Secondary | ICD-10-CM | POA: Diagnosis not present

## 2021-03-22 DIAGNOSIS — J019 Acute sinusitis, unspecified: Secondary | ICD-10-CM | POA: Diagnosis not present

## 2021-04-01 DIAGNOSIS — R509 Fever, unspecified: Secondary | ICD-10-CM | POA: Diagnosis not present

## 2021-04-01 DIAGNOSIS — Z13220 Encounter for screening for lipoid disorders: Secondary | ICD-10-CM | POA: Diagnosis not present

## 2021-04-01 DIAGNOSIS — E039 Hypothyroidism, unspecified: Secondary | ICD-10-CM | POA: Diagnosis not present

## 2021-04-01 DIAGNOSIS — R5383 Other fatigue: Secondary | ICD-10-CM | POA: Diagnosis not present

## 2021-04-01 DIAGNOSIS — Z125 Encounter for screening for malignant neoplasm of prostate: Secondary | ICD-10-CM | POA: Diagnosis not present

## 2021-04-01 DIAGNOSIS — J329 Chronic sinusitis, unspecified: Secondary | ICD-10-CM | POA: Diagnosis not present

## 2021-04-11 DIAGNOSIS — R7309 Other abnormal glucose: Secondary | ICD-10-CM | POA: Diagnosis not present

## 2021-04-11 DIAGNOSIS — Z6830 Body mass index (BMI) 30.0-30.9, adult: Secondary | ICD-10-CM | POA: Diagnosis not present

## 2021-04-11 DIAGNOSIS — Z0001 Encounter for general adult medical examination with abnormal findings: Secondary | ICD-10-CM | POA: Diagnosis not present

## 2021-04-11 DIAGNOSIS — E039 Hypothyroidism, unspecified: Secondary | ICD-10-CM | POA: Diagnosis not present

## 2021-04-11 DIAGNOSIS — Z23 Encounter for immunization: Secondary | ICD-10-CM | POA: Diagnosis not present

## 2021-04-11 DIAGNOSIS — E669 Obesity, unspecified: Secondary | ICD-10-CM | POA: Diagnosis not present

## 2021-04-11 DIAGNOSIS — Z1331 Encounter for screening for depression: Secondary | ICD-10-CM | POA: Diagnosis not present

## 2021-04-19 DIAGNOSIS — Z6830 Body mass index (BMI) 30.0-30.9, adult: Secondary | ICD-10-CM | POA: Diagnosis not present

## 2021-04-19 DIAGNOSIS — Z823 Family history of stroke: Secondary | ICD-10-CM | POA: Diagnosis not present

## 2021-04-19 DIAGNOSIS — R030 Elevated blood-pressure reading, without diagnosis of hypertension: Secondary | ICD-10-CM | POA: Diagnosis not present

## 2021-04-19 DIAGNOSIS — E039 Hypothyroidism, unspecified: Secondary | ICD-10-CM | POA: Diagnosis not present

## 2021-04-19 DIAGNOSIS — Z89412 Acquired absence of left great toe: Secondary | ICD-10-CM | POA: Diagnosis not present

## 2021-04-19 DIAGNOSIS — E669 Obesity, unspecified: Secondary | ICD-10-CM | POA: Diagnosis not present

## 2021-04-19 DIAGNOSIS — Z809 Family history of malignant neoplasm, unspecified: Secondary | ICD-10-CM | POA: Diagnosis not present

## 2021-04-19 DIAGNOSIS — Z825 Family history of asthma and other chronic lower respiratory diseases: Secondary | ICD-10-CM | POA: Diagnosis not present

## 2021-04-19 DIAGNOSIS — Z89422 Acquired absence of other left toe(s): Secondary | ICD-10-CM | POA: Diagnosis not present

## 2021-06-11 DIAGNOSIS — H5200 Hypermetropia, unspecified eye: Secondary | ICD-10-CM | POA: Diagnosis not present

## 2021-09-05 DIAGNOSIS — E890 Postprocedural hypothyroidism: Secondary | ICD-10-CM | POA: Diagnosis not present

## 2021-09-12 DIAGNOSIS — E890 Postprocedural hypothyroidism: Secondary | ICD-10-CM | POA: Diagnosis not present

## 2021-10-08 DIAGNOSIS — E669 Obesity, unspecified: Secondary | ICD-10-CM | POA: Diagnosis not present

## 2021-10-08 DIAGNOSIS — E890 Postprocedural hypothyroidism: Secondary | ICD-10-CM | POA: Diagnosis not present

## 2021-10-08 DIAGNOSIS — Z008 Encounter for other general examination: Secondary | ICD-10-CM | POA: Diagnosis not present

## 2021-10-08 DIAGNOSIS — Z809 Family history of malignant neoplasm, unspecified: Secondary | ICD-10-CM | POA: Diagnosis not present

## 2021-10-08 DIAGNOSIS — Z6830 Body mass index (BMI) 30.0-30.9, adult: Secondary | ICD-10-CM | POA: Diagnosis not present

## 2021-10-08 DIAGNOSIS — Z823 Family history of stroke: Secondary | ICD-10-CM | POA: Diagnosis not present

## 2021-10-08 DIAGNOSIS — R030 Elevated blood-pressure reading, without diagnosis of hypertension: Secondary | ICD-10-CM | POA: Diagnosis not present

## 2021-10-28 DIAGNOSIS — Z96611 Presence of right artificial shoulder joint: Secondary | ICD-10-CM | POA: Diagnosis not present

## 2021-11-12 DIAGNOSIS — E890 Postprocedural hypothyroidism: Secondary | ICD-10-CM | POA: Diagnosis not present

## 2022-03-03 DIAGNOSIS — E6609 Other obesity due to excess calories: Secondary | ICD-10-CM | POA: Diagnosis not present

## 2022-03-03 DIAGNOSIS — J329 Chronic sinusitis, unspecified: Secondary | ICD-10-CM | POA: Diagnosis not present

## 2022-03-03 DIAGNOSIS — M1991 Primary osteoarthritis, unspecified site: Secondary | ICD-10-CM | POA: Diagnosis not present

## 2022-03-03 DIAGNOSIS — S20219A Contusion of unspecified front wall of thorax, initial encounter: Secondary | ICD-10-CM | POA: Diagnosis not present

## 2022-03-03 DIAGNOSIS — I10 Essential (primary) hypertension: Secondary | ICD-10-CM | POA: Diagnosis not present

## 2022-03-03 DIAGNOSIS — Z6831 Body mass index (BMI) 31.0-31.9, adult: Secondary | ICD-10-CM | POA: Diagnosis not present

## 2022-04-24 DIAGNOSIS — Z23 Encounter for immunization: Secondary | ICD-10-CM | POA: Diagnosis not present

## 2022-06-04 DIAGNOSIS — J329 Chronic sinusitis, unspecified: Secondary | ICD-10-CM | POA: Diagnosis not present

## 2022-06-04 DIAGNOSIS — M1991 Primary osteoarthritis, unspecified site: Secondary | ICD-10-CM | POA: Diagnosis not present

## 2022-06-04 DIAGNOSIS — Z6831 Body mass index (BMI) 31.0-31.9, adult: Secondary | ICD-10-CM | POA: Diagnosis not present

## 2022-06-04 DIAGNOSIS — I10 Essential (primary) hypertension: Secondary | ICD-10-CM | POA: Diagnosis not present

## 2022-06-04 DIAGNOSIS — E6609 Other obesity due to excess calories: Secondary | ICD-10-CM | POA: Diagnosis not present

## 2022-06-04 DIAGNOSIS — E039 Hypothyroidism, unspecified: Secondary | ICD-10-CM | POA: Diagnosis not present

## 2022-06-11 DIAGNOSIS — E6609 Other obesity due to excess calories: Secondary | ICD-10-CM | POA: Diagnosis not present

## 2022-06-11 DIAGNOSIS — I10 Essential (primary) hypertension: Secondary | ICD-10-CM | POA: Diagnosis not present

## 2022-06-11 DIAGNOSIS — Z125 Encounter for screening for malignant neoplasm of prostate: Secondary | ICD-10-CM | POA: Diagnosis not present

## 2022-06-11 DIAGNOSIS — Z6831 Body mass index (BMI) 31.0-31.9, adult: Secondary | ICD-10-CM | POA: Diagnosis not present

## 2022-06-11 DIAGNOSIS — Z1331 Encounter for screening for depression: Secondary | ICD-10-CM | POA: Diagnosis not present

## 2022-06-11 DIAGNOSIS — E039 Hypothyroidism, unspecified: Secondary | ICD-10-CM | POA: Diagnosis not present

## 2022-06-11 DIAGNOSIS — R7309 Other abnormal glucose: Secondary | ICD-10-CM | POA: Diagnosis not present

## 2022-06-11 DIAGNOSIS — Z0001 Encounter for general adult medical examination with abnormal findings: Secondary | ICD-10-CM | POA: Diagnosis not present

## 2022-06-19 DIAGNOSIS — Z1283 Encounter for screening for malignant neoplasm of skin: Secondary | ICD-10-CM | POA: Diagnosis not present

## 2022-06-19 DIAGNOSIS — L570 Actinic keratosis: Secondary | ICD-10-CM | POA: Diagnosis not present

## 2022-06-19 DIAGNOSIS — D225 Melanocytic nevi of trunk: Secondary | ICD-10-CM | POA: Diagnosis not present

## 2022-06-19 DIAGNOSIS — X32XXXD Exposure to sunlight, subsequent encounter: Secondary | ICD-10-CM | POA: Diagnosis not present

## 2022-09-15 DIAGNOSIS — E890 Postprocedural hypothyroidism: Secondary | ICD-10-CM | POA: Diagnosis not present

## 2022-09-22 DIAGNOSIS — E890 Postprocedural hypothyroidism: Secondary | ICD-10-CM | POA: Diagnosis not present

## 2022-12-23 DIAGNOSIS — E890 Postprocedural hypothyroidism: Secondary | ICD-10-CM | POA: Diagnosis not present

## 2023-05-15 DIAGNOSIS — Z23 Encounter for immunization: Secondary | ICD-10-CM | POA: Diagnosis not present

## 2023-05-20 DIAGNOSIS — Z6831 Body mass index (BMI) 31.0-31.9, adult: Secondary | ICD-10-CM | POA: Diagnosis not present

## 2023-05-20 DIAGNOSIS — N182 Chronic kidney disease, stage 2 (mild): Secondary | ICD-10-CM | POA: Diagnosis not present

## 2023-05-20 DIAGNOSIS — Z79890 Hormone replacement therapy (postmenopausal): Secondary | ICD-10-CM | POA: Diagnosis not present

## 2023-05-20 DIAGNOSIS — E669 Obesity, unspecified: Secondary | ICD-10-CM | POA: Diagnosis not present

## 2023-05-20 DIAGNOSIS — Z89422 Acquired absence of other left toe(s): Secondary | ICD-10-CM | POA: Diagnosis not present

## 2023-05-20 DIAGNOSIS — Z823 Family history of stroke: Secondary | ICD-10-CM | POA: Diagnosis not present

## 2023-05-20 DIAGNOSIS — Z89412 Acquired absence of left great toe: Secondary | ICD-10-CM | POA: Diagnosis not present

## 2023-05-20 DIAGNOSIS — R030 Elevated blood-pressure reading, without diagnosis of hypertension: Secondary | ICD-10-CM | POA: Diagnosis not present

## 2023-05-20 DIAGNOSIS — Z809 Family history of malignant neoplasm, unspecified: Secondary | ICD-10-CM | POA: Diagnosis not present

## 2023-05-20 DIAGNOSIS — M1990 Unspecified osteoarthritis, unspecified site: Secondary | ICD-10-CM | POA: Diagnosis not present

## 2023-05-20 DIAGNOSIS — E039 Hypothyroidism, unspecified: Secondary | ICD-10-CM | POA: Diagnosis not present

## 2023-06-15 DIAGNOSIS — Z961 Presence of intraocular lens: Secondary | ICD-10-CM | POA: Diagnosis not present

## 2023-08-14 DIAGNOSIS — E039 Hypothyroidism, unspecified: Secondary | ICD-10-CM | POA: Diagnosis not present

## 2023-08-14 DIAGNOSIS — D508 Other iron deficiency anemias: Secondary | ICD-10-CM | POA: Diagnosis not present

## 2023-09-16 DIAGNOSIS — E890 Postprocedural hypothyroidism: Secondary | ICD-10-CM | POA: Diagnosis not present

## 2023-09-29 DIAGNOSIS — E890 Postprocedural hypothyroidism: Secondary | ICD-10-CM | POA: Diagnosis not present

## 2023-12-11 DIAGNOSIS — R6889 Other general symptoms and signs: Secondary | ICD-10-CM | POA: Diagnosis not present

## 2023-12-11 DIAGNOSIS — Z20828 Contact with and (suspected) exposure to other viral communicable diseases: Secondary | ICD-10-CM | POA: Diagnosis not present

## 2023-12-11 DIAGNOSIS — Z6832 Body mass index (BMI) 32.0-32.9, adult: Secondary | ICD-10-CM | POA: Diagnosis not present

## 2023-12-11 DIAGNOSIS — J0100 Acute maxillary sinusitis, unspecified: Secondary | ICD-10-CM | POA: Diagnosis not present

## 2023-12-11 DIAGNOSIS — E6609 Other obesity due to excess calories: Secondary | ICD-10-CM | POA: Diagnosis not present

## 2024-04-20 DIAGNOSIS — Z1283 Encounter for screening for malignant neoplasm of skin: Secondary | ICD-10-CM | POA: Diagnosis not present

## 2024-04-20 DIAGNOSIS — D225 Melanocytic nevi of trunk: Secondary | ICD-10-CM | POA: Diagnosis not present

## 2024-04-20 DIAGNOSIS — L570 Actinic keratosis: Secondary | ICD-10-CM | POA: Diagnosis not present

## 2024-04-20 DIAGNOSIS — X32XXXD Exposure to sunlight, subsequent encounter: Secondary | ICD-10-CM | POA: Diagnosis not present

## 2024-04-20 DIAGNOSIS — C4441 Basal cell carcinoma of skin of scalp and neck: Secondary | ICD-10-CM | POA: Diagnosis not present

## 2024-05-05 DIAGNOSIS — Z96653 Presence of artificial knee joint, bilateral: Secondary | ICD-10-CM | POA: Diagnosis not present

## 2024-05-05 DIAGNOSIS — Z471 Aftercare following joint replacement surgery: Secondary | ICD-10-CM | POA: Diagnosis not present

## 2024-05-17 DIAGNOSIS — J069 Acute upper respiratory infection, unspecified: Secondary | ICD-10-CM | POA: Diagnosis not present

## 2024-05-30 DIAGNOSIS — Z85828 Personal history of other malignant neoplasm of skin: Secondary | ICD-10-CM | POA: Diagnosis not present

## 2024-05-30 DIAGNOSIS — Z08 Encounter for follow-up examination after completed treatment for malignant neoplasm: Secondary | ICD-10-CM | POA: Diagnosis not present
